# Patient Record
Sex: Female | Born: 1980
Health system: Southern US, Community
[De-identification: ages and names within clinical notes are randomized; demographics above are authoritative.]

## PROBLEM LIST (undated history)

## (undated) DIAGNOSIS — I10 Essential (primary) hypertension: Secondary | ICD-10-CM

## (undated) DIAGNOSIS — K219 Gastro-esophageal reflux disease without esophagitis: Secondary | ICD-10-CM

## (undated) DIAGNOSIS — F32A Depression, unspecified: Secondary | ICD-10-CM

## (undated) DIAGNOSIS — IMO0002 Reserved for concepts with insufficient information to code with codable children: Secondary | ICD-10-CM

## (undated) DIAGNOSIS — E538 Deficiency of other specified B group vitamins: Secondary | ICD-10-CM

## (undated) DIAGNOSIS — K52839 Microscopic colitis, unspecified: Secondary | ICD-10-CM

## (undated) DIAGNOSIS — I2510 Atherosclerotic heart disease of native coronary artery without angina pectoris: Secondary | ICD-10-CM

## (undated) DIAGNOSIS — K529 Noninfective gastroenteritis and colitis, unspecified: Secondary | ICD-10-CM

## (undated) DIAGNOSIS — K760 Fatty (change of) liver, not elsewhere classified: Secondary | ICD-10-CM

## (undated) DIAGNOSIS — R112 Nausea with vomiting, unspecified: Secondary | ICD-10-CM

## (undated) DIAGNOSIS — F4001 Agoraphobia with panic disorder: Secondary | ICD-10-CM

## (undated) DIAGNOSIS — N809 Endometriosis, unspecified: Secondary | ICD-10-CM

## (undated) DIAGNOSIS — M1990 Unspecified osteoarthritis, unspecified site: Secondary | ICD-10-CM

## (undated) DIAGNOSIS — K589 Irritable bowel syndrome without diarrhea: Secondary | ICD-10-CM

## (undated) DIAGNOSIS — T8859XA Other complications of anesthesia, initial encounter: Secondary | ICD-10-CM

## (undated) DIAGNOSIS — F319 Bipolar disorder, unspecified: Secondary | ICD-10-CM

## (undated) DIAGNOSIS — K635 Polyp of colon: Secondary | ICD-10-CM

## (undated) DIAGNOSIS — F329 Major depressive disorder, single episode, unspecified: Secondary | ICD-10-CM

## (undated) DIAGNOSIS — E785 Hyperlipidemia, unspecified: Secondary | ICD-10-CM

## (undated) DIAGNOSIS — F419 Anxiety disorder, unspecified: Secondary | ICD-10-CM

## (undated) DIAGNOSIS — D649 Anemia, unspecified: Secondary | ICD-10-CM

## (undated) DIAGNOSIS — Z789 Other specified health status: Secondary | ICD-10-CM

## (undated) DIAGNOSIS — Z9889 Other specified postprocedural states: Secondary | ICD-10-CM

## (undated) DIAGNOSIS — G43909 Migraine, unspecified, not intractable, without status migrainosus: Secondary | ICD-10-CM

## (undated) HISTORY — DX: Gastro-esophageal reflux disease without esophagitis: K21.9

## (undated) HISTORY — DX: Fatty (change of) liver, not elsewhere classified: K76.0

## (undated) HISTORY — PX: CHOLECYSTECTOMY: SHX55

## (undated) HISTORY — DX: Anxiety disorder, unspecified: F41.9

## (undated) HISTORY — DX: Anemia, unspecified: D64.9

## (undated) HISTORY — PX: KNEE SURGERY: SHX244

## (undated) HISTORY — DX: Polyp of colon: K63.5

## (undated) HISTORY — DX: Agoraphobia with panic disorder: F40.01

## (undated) HISTORY — DX: Essential (primary) hypertension: I10

## (undated) HISTORY — DX: Deficiency of other specified B group vitamins: E53.8

## (undated) HISTORY — DX: Endometriosis, unspecified: N80.9

## (undated) HISTORY — DX: Atherosclerotic heart disease of native coronary artery without angina pectoris: I25.10

## (undated) HISTORY — DX: Hyperlipidemia, unspecified: E78.5

## (undated) HISTORY — DX: Irritable bowel syndrome, unspecified: K58.9

## (undated) HISTORY — DX: Noninfective gastroenteritis and colitis, unspecified: K52.9

## (undated) HISTORY — PX: MANDIBLE SURGERY: SHX707

## (undated) HISTORY — DX: Major depressive disorder, single episode, unspecified: F32.9

## (undated) HISTORY — DX: Depression, unspecified: F32.A

## (undated) HISTORY — PX: APPENDECTOMY: SHX54

---

## 1999-04-04 ENCOUNTER — Other Ambulatory Visit: Admission: RE | Admit: 1999-04-04 | Discharge: 1999-04-04 | Payer: Self-pay | Admitting: Family Medicine

## 1999-08-07 ENCOUNTER — Other Ambulatory Visit: Admission: RE | Admit: 1999-08-07 | Discharge: 1999-08-07 | Payer: Self-pay | Admitting: Family Medicine

## 1999-09-27 ENCOUNTER — Other Ambulatory Visit: Admission: RE | Admit: 1999-09-27 | Discharge: 1999-09-27 | Payer: Self-pay | Admitting: Obstetrics and Gynecology

## 1999-11-28 ENCOUNTER — Other Ambulatory Visit: Admission: RE | Admit: 1999-11-28 | Discharge: 1999-11-28 | Payer: Self-pay | Admitting: *Deleted

## 1999-11-28 ENCOUNTER — Other Ambulatory Visit: Admission: RE | Admit: 1999-11-28 | Discharge: 1999-11-28 | Payer: Self-pay | Admitting: Obstetrics and Gynecology

## 1999-12-01 ENCOUNTER — Other Ambulatory Visit: Admission: RE | Admit: 1999-12-01 | Discharge: 1999-12-01 | Payer: Self-pay | Admitting: Obstetrics and Gynecology

## 1999-12-01 ENCOUNTER — Encounter (INDEPENDENT_AMBULATORY_CARE_PROVIDER_SITE_OTHER): Payer: Self-pay

## 2000-02-25 ENCOUNTER — Ambulatory Visit (HOSPITAL_COMMUNITY): Admission: RE | Admit: 2000-02-25 | Discharge: 2000-02-25 | Payer: Self-pay | Admitting: Neurosurgery

## 2000-02-25 ENCOUNTER — Encounter: Payer: Self-pay | Admitting: Neurosurgery

## 2000-04-03 ENCOUNTER — Other Ambulatory Visit: Admission: RE | Admit: 2000-04-03 | Discharge: 2000-04-03 | Payer: Self-pay | Admitting: *Deleted

## 2000-08-12 ENCOUNTER — Other Ambulatory Visit: Admission: RE | Admit: 2000-08-12 | Discharge: 2000-08-12 | Payer: Self-pay | Admitting: *Deleted

## 2000-12-17 ENCOUNTER — Other Ambulatory Visit: Admission: RE | Admit: 2000-12-17 | Discharge: 2000-12-17 | Payer: Self-pay | Admitting: *Deleted

## 2001-01-14 ENCOUNTER — Other Ambulatory Visit: Admission: RE | Admit: 2001-01-14 | Discharge: 2001-01-14 | Payer: Self-pay | Admitting: *Deleted

## 2001-01-14 ENCOUNTER — Encounter (INDEPENDENT_AMBULATORY_CARE_PROVIDER_SITE_OTHER): Payer: Self-pay

## 2001-05-20 ENCOUNTER — Other Ambulatory Visit: Admission: RE | Admit: 2001-05-20 | Discharge: 2001-05-20 | Payer: Self-pay | Admitting: *Deleted

## 2001-09-29 ENCOUNTER — Encounter: Payer: Self-pay | Admitting: Infectious Diseases

## 2001-09-29 ENCOUNTER — Ambulatory Visit (HOSPITAL_COMMUNITY): Admission: RE | Admit: 2001-09-29 | Discharge: 2001-09-29 | Payer: Self-pay | Admitting: Infectious Diseases

## 2001-10-05 ENCOUNTER — Other Ambulatory Visit: Admission: RE | Admit: 2001-10-05 | Discharge: 2001-10-05 | Payer: Self-pay | Admitting: *Deleted

## 2002-02-10 ENCOUNTER — Other Ambulatory Visit: Admission: RE | Admit: 2002-02-10 | Discharge: 2002-02-10 | Payer: Self-pay | Admitting: *Deleted

## 2002-06-07 ENCOUNTER — Other Ambulatory Visit: Admission: RE | Admit: 2002-06-07 | Discharge: 2002-06-07 | Payer: Self-pay | Admitting: *Deleted

## 2002-06-23 ENCOUNTER — Ambulatory Visit (HOSPITAL_COMMUNITY): Admission: RE | Admit: 2002-06-23 | Discharge: 2002-06-23 | Payer: Self-pay | Admitting: Infectious Diseases

## 2002-06-23 ENCOUNTER — Encounter: Payer: Self-pay | Admitting: Infectious Diseases

## 2003-02-08 ENCOUNTER — Encounter: Payer: Self-pay | Admitting: Obstetrics and Gynecology

## 2003-02-08 ENCOUNTER — Inpatient Hospital Stay (HOSPITAL_COMMUNITY): Admission: AD | Admit: 2003-02-08 | Discharge: 2003-02-08 | Payer: Self-pay | Admitting: Obstetrics and Gynecology

## 2003-02-16 ENCOUNTER — Other Ambulatory Visit: Admission: RE | Admit: 2003-02-16 | Discharge: 2003-02-16 | Payer: Self-pay | Admitting: *Deleted

## 2003-03-24 ENCOUNTER — Inpatient Hospital Stay (HOSPITAL_COMMUNITY): Admission: EM | Admit: 2003-03-24 | Discharge: 2003-03-28 | Payer: Self-pay | Admitting: *Deleted

## 2003-03-24 ENCOUNTER — Encounter: Payer: Self-pay | Admitting: *Deleted

## 2003-07-09 ENCOUNTER — Emergency Department (HOSPITAL_COMMUNITY): Admission: EM | Admit: 2003-07-09 | Discharge: 2003-07-09 | Payer: Self-pay | Admitting: Emergency Medicine

## 2003-07-11 ENCOUNTER — Ambulatory Visit (HOSPITAL_COMMUNITY): Admission: RE | Admit: 2003-07-11 | Discharge: 2003-07-11 | Payer: Self-pay | Admitting: Radiology

## 2003-07-26 ENCOUNTER — Ambulatory Visit (HOSPITAL_COMMUNITY): Admission: RE | Admit: 2003-07-26 | Discharge: 2003-07-26 | Payer: Self-pay | Admitting: Neurology

## 2005-01-31 ENCOUNTER — Ambulatory Visit: Payer: Self-pay | Admitting: Family Medicine

## 2005-04-14 ENCOUNTER — Ambulatory Visit: Payer: Self-pay | Admitting: Family Medicine

## 2005-07-30 ENCOUNTER — Ambulatory Visit: Payer: Self-pay | Admitting: Family Medicine

## 2005-10-15 ENCOUNTER — Ambulatory Visit: Payer: Self-pay | Admitting: Family Medicine

## 2009-01-10 ENCOUNTER — Encounter: Admission: RE | Admit: 2009-01-10 | Discharge: 2009-01-10 | Payer: Self-pay | Admitting: Orthopedic Surgery

## 2009-01-10 ENCOUNTER — Encounter: Admission: RE | Admit: 2009-01-10 | Discharge: 2009-01-10 | Payer: Self-pay

## 2009-05-15 ENCOUNTER — Encounter: Admission: RE | Admit: 2009-05-15 | Discharge: 2009-05-15 | Payer: Self-pay | Admitting: Orthopedic Surgery

## 2011-01-03 NOTE — Discharge Summary (Signed)
NAMEROBERT, Kayla Holden NO.:  [id number]   MEDICAL RECORD NO.:  [id number]                   PATIENT TYPE:  INP   LOCATION:  3003                                 FACILITY:  [facility]   PHYSICIAN:  Hettie Holstein, D.O.                 DATE OF BIRTH:  February 26, 1981   DATE OF ADMISSION:  03/23/2003  DATE OF DISCHARGE:  03/28/2003                                 DISCHARGE SUMMARY   ADMISSION DIAGNOSES:  1. Aseptic meningitis  2. Severe meningismus.  3. Cephalgia.   DISCHARGE DIAGNOSES:  1. Aseptic meningitis  2. Severe meningismus.  3. Cephalgia.   CONSULTATIONS:  Infectious disease.   PROCEDURE:  Lumbar puncture performed in the emergency department.   HISTORY OF PRESENT ILLNESS:  This is a 30 year old female with a medical  history provided by her mother secondary to the patient's discomfort and  difficulty in obtaining history from her.  Over the course of the week, the  patient's mother stated that she has had complaints of a headache and back  aches that have progressively worsened over the course of the week.  She  stated that she had complained of some numbness and tingling in her  extremities as well as back and upper thighs.  She had a fever yesterday and  presented to her PCP and was sent to the hospital for an LP.  The family  decided they would like her to be seen at [facility]. [facility],  therefore, she was evaluated in the emergency department.  She was found to  have stiff neck, nuchal rigidity and low grade temperature.  CT was  performed in the ER and revealed no acute bleed.  Lumbar puncture was  performed revealing findings consistent with nonbacterial  meningitis/suspected aseptic meningitis.   HOSPITAL COURSE:  Ms. Pickert was admitted to the monitored floor.  She was  placed on pain medications. She had trouble tolerating the Dilaudid as well  as morphine, therefore, Demerol was substituted.  She found very little  relief.  She continued to be very uncomfortable despite narcotics.  Her  symptoms persisted and due to family concerns, infectious disease was  involved.  At this time, serologies of her CSF were pending.  Latex  agglutination studies for common pathogens were negative.  Serologies for  arboviral panel was sent. HIV studies were sent as well and were negative.  The arboviral  panel as well, was negative.  She slowly improved with time  and she was on OxyContin 10 mg p.o. q.12h. and she did quite well.  She  remained afebrile with stable vital signs.  She was discharged home on  Avalox 400 mg p.o. daily and oxycodone 5 mg one to two p.o. q.6h. p.r.n. She  was discharged to follow up with her primary care physician in one to two  weeks.   LABORATORY DATA:  This hospitalization, CT  scan of her head revealed no  evidence of acute intracranial abnormality.   Procedure, as noted above, was lumbar puncture under fluoroscopy.  HIV  status was negative.  Her CSF revealed rbc count of 3.6, wbc 7.3,  neutrophils 3, lymphocytes 40 to 80%, monocytes 15 to 45%, eos 0 to 1%,  protein 90  mg/dL, glucose 53 mg/dL.  Blood cultures and CSF cultures were negative.  The Gram stain revealed wbc, this was on the CSF and no organisms or virus  was detected.  As before, latex agglutination studies were negative, no  bacterial pathogens.                                                Hettie Holstein, D.O.    ESS/MEDQ  D:  05/03/2003  T:  05/04/2003  Job:  [id number]

## 2011-01-03 NOTE — H&P (Signed)
NAME:  Kayla Holden, Kayla Holden NO.:  [id number]   MEDICAL RECORD NO.:  [id number]                   PATIENT TYPE:  INP   LOCATION:  3003                                 FACILITY:  [facility]   PHYSICIAN:  Hettie Holstein, D.O.                 DATE OF BIRTH:  [birth year]   DATE OF ADMISSION:  03/23/2003  DATE OF DISCHARGE:                                HISTORY & PHYSICAL   PRIMARY CARE PHYSICIAN:  Dina Rich, M.D., [place].   CHIEF COMPLAINT:  Headache and backache progressing over course of a week.   HISTORY OF PRESENT ILLNESS:  This is a 30 year old female with medical  history provided by her mother, who is present, secondary to the patient's  discomfort and difficulty in obtaining history from her.  Over the course of  a week, the patient's mother states she has had complaints of headaches and  backaches that have progressively worsened over the course of a week.  She  stated that she had complained of some numbness and tingling in her  extremities as well as the back of her thigh.  She noted a fever yesterday  and presented to her primary physician and was sent to the hospital for an  LP.  The family decided that they would like to be seen at [facility].  Therefore, she were evaluated here at [facility] Emergency [facility] with  the above complaints.  She was found to have a stiff neck, nuchal rigidity,  and a low-grade temperature.  CT was performed in the emergency department  revealing no acute bleed.  A lumbar puncture was performed revealing  findings consistent with nonbacterial meningitis and suspected aseptic  meningitis.   PAST MEDICAL HISTORY:  The patient's mother denies any history of  hypertension, coronary artery disease, diabetes mellitus.  States that she  has an old back injury from horse riding in the distant past.  She states  that she has undergone some therapy for what sounds like latent  tuberculosis.   MEDICATIONS:  She is only on  birth control pills at home and no other  medications.   ALLERGIES:  She denies any allergies.  She has intolerance to INH, where she  developed GI symptoms.   SOCIAL HISTORY:  She uses occasional tobacco and occasional alcohol.  She is  single.  She worked as a Designer, industrial/product at [employer], as well as here  at [employer].  She denies any needle sticks or recent sick contacts or  recent travel out of the country or out of the area.   FAMILY HISTORY:  Unremarkable.  Her mother is 67 and healthy.  Her  biological father is 65 and healthy as well.  She lives with her stepfather  and her mother at present.   REVIEW OF SYSTEMS:  GENERAL:  No weight loss in her activity over the past  couple of months.  All of her problems have started within the past week.  HEENT:  The patient reports only problems with headache over the past week  prior to this.  No history of pharyngitis.  CARDIOVASCULAR:  No chest pain  __________.  GI:  No nausea today or while she was in the emergency  department.  Otherwise, no vomiting, diarrhea, constipation, melena,  hematochezia, or abdominal discomfort.  GU:  The patient is denying any  complaints at this time.  A urine drug screen was performed in the emergency  department.  She is not pregnant.  Upon questioning regarding studies  performed on February 08, 2003, the patient provides no history.  She had a CT  scan which apparently was for some left-sided abdominal pain, having a  transvaginal ultrasound revealing an ovarian cyst.  Further history with  regard to this will be deferred until the patient is more apt to answer  these questions.  NEUROLOGIC:  As noted above in the history of presenting illness.  The  patient has had some tingling in her feet and hands, as well as the back of  her thighs with the history of presenting illness.   PHYSICAL EXAMINATION:  VITAL SIGNS:  The patient's T-max in the emergency  department was 100.3 and her blood pressure  was 133/74, respirations 16,  heart rate is at 87.  O2 saturation is 98% on room air.  GENERAL:  She is very somnolent in bed.  Answers a few questions.  Her  respirations are nonlabored and appears to be nontoxic.  HEENT:  She does have nuchal rigidity.  Neck is very tender to touch.  She  exhibits no photophobia.  Extraocular muscles:  The patient is very  uncomfortable and not completely cooperative with the examination.  Throat  is clear.  There is no evidence of exudate.  Neck, as noted above, is very  tender.  There is no thyromegaly, no tracheal deviation.  CARDIOVASCULAR:  S1, S2 without S3, S4 or murmur, rubs, or gallop.  PULMONARY:  There was no wheeze, crackles, rales to auscultation.  Lungs  were clear.  ABDOMEN:  Soft, nontender.  No palpable mass, no palpable tenderness.  Bowel  sounds were normoactive.  EXTREMITIES:  Nonedematous.  There was no cyanosis or clubbing.  Peripheral  pulses were palpable bilaterally and symmetrical.  Strength was +4/5 with  respect to upper extremities; however, this was limited secondary to patient  cooperation.  NEUROLOGIC:  The patient was alert and oriented to time and place, as well  as person; was able to answer questions appropriately when prompted  consistently.   LABORATORY DATA:  CBC:  WBC 4.6, hemoglobin 11.6, hematocrit 33.2, platelets  159.  Sodium 138, potassium 3.8, chloride 110, CO2 24, BUN 9, creatinine  0.6, glucose 100, calcium 8.2.  CSF studies revealed RBC equals 36, WBC  equals 7.3, neutrophils 3%, lymphocytes 35%, monocytes 61%, eosinophils 1%.  Total protein was 98 with normal range 15-45.  Glucose was 53 mg/dL.  It was  colorless.  Urine pregnancy was negative.  UA was negative as well.  Latex  and agglutination studies were pending as well as arboviral and enteroviral  serologies.   IMPRESSION AND PLAN:  Aseptic meningitis.  We will currently admit Ms. Crissman for symptomatic management and await further studies on  her  cerebrospinal fluid.  We will administer narcotic analgesic as needed and  obtain blood cultures as she is currently febrile.  We will administer  intravenous fluids and  keep her under close observation.                                                Hettie Holstein, D.O.    ESS/MEDQ  D:  03/24/2003  T:  03/24/2003  Job:  [id number]

## 2011-01-14 ENCOUNTER — Other Ambulatory Visit: Payer: Self-pay | Admitting: Orthopedic Surgery

## 2011-01-14 DIAGNOSIS — M25562 Pain in left knee: Secondary | ICD-10-CM

## 2011-01-16 ENCOUNTER — Other Ambulatory Visit: Payer: Self-pay

## 2011-01-16 ENCOUNTER — Ambulatory Visit
Admission: RE | Admit: 2011-01-16 | Discharge: 2011-01-16 | Disposition: A | Discharge status: Home / Self Care | Payer: BC Managed Care – PPO | Source: Ambulatory Visit | Attending: Orthopedic Surgery | Admitting: Orthopedic Surgery

## 2011-01-16 DIAGNOSIS — M25562 Pain in left knee: Secondary | ICD-10-CM

## 2012-12-22 ENCOUNTER — Ambulatory Visit: Payer: Self-pay

## 2013-05-05 ENCOUNTER — Other Ambulatory Visit: Payer: Self-pay | Admitting: Neurology

## 2013-05-05 DIAGNOSIS — R519 Headache, unspecified: Secondary | ICD-10-CM

## 2013-05-10 ENCOUNTER — Ambulatory Visit
Admission: RE | Admit: 2013-05-10 | Discharge: 2013-05-10 | Disposition: A | Discharge status: Home / Self Care | Payer: BC Managed Care – PPO | Source: Ambulatory Visit | Attending: Neurology | Admitting: Neurology

## 2013-05-10 DIAGNOSIS — R519 Headache, unspecified: Secondary | ICD-10-CM

## 2013-05-10 MED ORDER — IOHEXOL 180 MG/ML  SOLN
1.0000 mL | Freq: Once | INTRAMUSCULAR | Status: AC | PRN
  Administered 2013-05-10: 1 mL via EPIDURAL

## 2013-05-10 NOTE — Progress Notes (Addendum)
Discharge instructions explained to pt and her father.

## 2013-05-10 NOTE — Progress Notes (Signed)
Blood drawn from left cephalic vein for blood patch. Site is unremarkable and pt tolerated procedure well.

## 2014-07-18 ENCOUNTER — Emergency Department: Payer: Self-pay | Admitting: Emergency Medicine

## 2014-07-18 LAB — CBC
HCT: 36.8 % (ref 35.0–47.0)
HGB: 12.2 g/dL (ref 12.0–16.0)
MCH: 30.8 pg (ref 26.0–34.0)
MCHC: 33.2 g/dL (ref 32.0–36.0)
MCV: 93 fL (ref 80–100)
Platelet: 262 10*3/uL (ref 150–440)
RBC: 3.96 10*6/uL (ref 3.80–5.20)
RDW: 13.1 % (ref 11.5–14.5)
WBC: 7.4 10*3/uL (ref 3.6–11.0)

## 2014-07-18 LAB — BASIC METABOLIC PANEL
Anion Gap: 9 (ref 7–16)
BUN: 11 mg/dL (ref 7–18)
Calcium, Total: 8.4 mg/dL — ABNORMAL LOW (ref 8.5–10.1)
Chloride: 106 mmol/L (ref 98–107)
Co2: 24 mmol/L (ref 21–32)
Creatinine: 0.79 mg/dL (ref 0.60–1.30)
EGFR (African American): >60
EGFR (Non-African Amer.): >60
Glucose: 100 mg/dL — ABNORMAL HIGH (ref 65–99)
Osmolality: 277 (ref 275–301)
Potassium: 4.1 mmol/L (ref 3.5–5.1)
Sodium: 139 mmol/L (ref 136–145)

## 2014-07-18 LAB — TROPONIN I
Troponin-I: <0.02 ng/mL
Troponin-I: <0.02 ng/mL

## 2015-05-08 ENCOUNTER — Other Ambulatory Visit (HOSPITAL_COMMUNITY): Payer: Self-pay | Admitting: Neurosurgery

## 2015-05-08 DIAGNOSIS — G935 Compression of brain: Secondary | ICD-10-CM

## 2015-05-09 DIAGNOSIS — R519 Headache, unspecified: Secondary | ICD-10-CM | POA: Insufficient documentation

## 2015-05-09 DIAGNOSIS — R51 Headache: Secondary | ICD-10-CM

## 2015-05-11 ENCOUNTER — Other Ambulatory Visit (HOSPITAL_COMMUNITY): Payer: Self-pay | Admitting: Neurosurgery

## 2015-05-11 ENCOUNTER — Ambulatory Visit (HOSPITAL_COMMUNITY)
Admission: RE | Admit: 2015-05-11 | Discharge: 2015-05-11 | Disposition: A | Discharge status: Home / Self Care | Payer: BLUE CROSS/BLUE SHIELD | Source: Ambulatory Visit | Attending: Neurosurgery | Admitting: Neurosurgery

## 2015-05-11 DIAGNOSIS — G935 Compression of brain: Secondary | ICD-10-CM

## 2015-05-15 ENCOUNTER — Encounter (HOSPITAL_COMMUNITY): Payer: Self-pay | Admitting: Emergency Medicine

## 2015-05-15 ENCOUNTER — Emergency Department (HOSPITAL_COMMUNITY)
Admission: EM | Admit: 2015-05-15 | Discharge: 2015-05-15 | Disposition: A | Discharge status: Home / Self Care | Payer: BLUE CROSS/BLUE SHIELD | Attending: Emergency Medicine | Admitting: Emergency Medicine

## 2015-05-15 ENCOUNTER — Emergency Department (HOSPITAL_COMMUNITY): Payer: BLUE CROSS/BLUE SHIELD

## 2015-05-15 DIAGNOSIS — Z8679 Personal history of other diseases of the circulatory system: Secondary | ICD-10-CM | POA: Diagnosis not present

## 2015-05-15 DIAGNOSIS — Z8719 Personal history of other diseases of the digestive system: Secondary | ICD-10-CM | POA: Diagnosis not present

## 2015-05-15 DIAGNOSIS — H469 Unspecified optic neuritis: Secondary | ICD-10-CM

## 2015-05-15 DIAGNOSIS — R51 Headache: Secondary | ICD-10-CM | POA: Diagnosis present

## 2015-05-15 DIAGNOSIS — H47399 Other disorders of optic disc, unspecified eye: Secondary | ICD-10-CM | POA: Insufficient documentation

## 2015-05-15 DIAGNOSIS — H5461 Unqualified visual loss, right eye, normal vision left eye: Secondary | ICD-10-CM | POA: Insufficient documentation

## 2015-05-15 HISTORY — DX: Reserved for concepts with insufficient information to code with codable children: IMO0002

## 2015-05-15 HISTORY — DX: Migraine, unspecified, not intractable, without status migrainosus: G43.909

## 2015-05-15 HISTORY — DX: Microscopic colitis, unspecified: K52.839

## 2015-05-15 MED ORDER — TETRACAINE HCL 0.5 % OP SOLN
2.0000 [drp] | Freq: Once | OPHTHALMIC | Status: AC
  Administered 2015-05-15: 2 [drp] via OPHTHALMIC
  Filled 2015-05-15: qty 2

## 2015-05-15 MED ORDER — LORAZEPAM 2 MG/ML IJ SOLN
1.0000 mg | Freq: Once | INTRAMUSCULAR | Status: AC
  Administered 2015-05-15: 1 mg via INTRAVENOUS
  Filled 2015-05-15: qty 1

## 2015-05-15 MED ORDER — HYDROMORPHONE HCL 1 MG/ML IJ SOLN
1.0000 mg | Freq: Once | INTRAMUSCULAR | Status: AC
  Administered 2015-05-15: 1 mg via INTRAVENOUS
  Filled 2015-05-15: qty 1

## 2015-05-15 MED ORDER — ONDANSETRON HCL 4 MG/2ML IJ SOLN
4.0000 mg | Freq: Once | INTRAMUSCULAR | Status: AC
  Administered 2015-05-15: 4 mg via INTRAVENOUS
  Filled 2015-05-15: qty 2

## 2015-05-15 NOTE — ED Notes (Signed)
MD at bedside. 

## 2015-05-15 NOTE — Discharge Instructions (Signed)
Visual Disturbances °You have had a disturbance in your vision. This may be caused by various conditions, such as: °· Migraines. Migraine headaches are often preceded by a disturbance in vision. Blind spots or light flashes are followed by a headache. This type of visual disturbance is temporary. It does not damage the eye. °· Glaucoma. This is caused by increased pressure in the eye. Symptoms include haziness, blurred vision, or seeing rainbow colored circles when looking at bright lights. Partial or complete visual loss can occur. You may or may not experience eye pain. Visual loss may be gradual or sudden and is irreversible. Glaucoma is the leading cause of blindness. °· Retina problems. Vision will be reduced if the retina becomes detached or if there is a circulation problem as with diabetes, high blood pressure, or a mini-stroke. Symptoms include seeing "floaters," flashes of light, or shadows, as if a curtain has fallen over your eye. °· Optic nerve problems. The main nerve in your eye can be damaged by redness, soreness, and swelling (inflammation), poor circulation, drugs, and toxins. °It is very important to have a complete exam done by a specialist to determine the exact cause of your eye problem. The specialist may recommend medicines or surgery, depending on the cause of the problem. This can help prevent further loss of vision or reduce the risk of having a stroke. Contact the caregiver to whom you have been referred and arrange for follow-up care right away. °SEEK IMMEDIATE MEDICAL CARE IF:  °· Your vision gets worse. °· You develop severe headaches. °· You have any weakness or numbness in the face, arms, or legs. °· You have any trouble speaking or walking. °Document Released: 09/11/2004 Document Revised: 10/27/2011 Document Reviewed: 01/02/2010 °ExitCare® Patient Information ©2015 [employer]. This information is not intended to replace advice given to you by your health care provider. Make sure  you discuss any questions you have with your health care provider. ° °

## 2015-05-15 NOTE — ED Notes (Signed)
Patient transported to MRI 

## 2015-05-15 NOTE — ED Notes (Signed)
Pt sts hx of HA x years and had this HA since April with loss of vision in left eye 10 days ago; pt sent by neurologist

## 2015-05-15 NOTE — ED Notes (Signed)
**Note De-identified  Obfuscation** MD at bedside. 

## 2015-05-15 NOTE — ED Provider Notes (Signed)
CSN: [id number]     Arrival date & time 05/15/15  4098 History   First MD Initiated Contact with Patient 05/15/15 [phone]     Chief Complaint  Patient presents with  . Headache  . Loss of Vision     (Consider location/radiation/quality/duration/timing/severity/associated sxs/prior Treatment) Patient is a 34 y.o. female presenting with headaches. The history is provided by the patient.  Headache Pain location:  Occipital Quality:  Dull Radiates to:  Does not radiate Onset quality:  Gradual Timing:  Constant Progression:  Unchanged Chronicity:  Chronic Similar to prior headaches: yes   Context: not activity and not exposure to bright light   Relieved by:  Nothing Worsened by:  Nothing Associated symptoms: no abdominal pain, no cough, no fever and no vomiting     Past Medical History  Diagnosis Date  . Migraine headache   . Microscopic colitis   . Chiari malformation    History reviewed. No pertinent past surgical history. History reviewed. No pertinent family history. Social History  Substance Use Topics  . Smoking status: Never Smoker   . Smokeless tobacco: None  . Alcohol Use: No   OB History    No data available     Review of Systems  Constitutional: Negative for fever.  Respiratory: Negative for cough and shortness of breath.   Gastrointestinal: Negative for vomiting and abdominal pain.  Neurological: Positive for headaches.  All other systems reviewed and are negative.     Allergies  Isoniazid; Rifampin; Butalbital-aspirin-caffeine; Ketoprofen; and Clindamycin/lincomycin  Home Medications   Prior to Admission medications   Not on File   BP 117/64 mmHg  Pulse 75  Temp(Src) 98.6 F (37 C) (Oral)  Resp 16  Ht  (1.6 m)  Wt 159 lb (72.122 kg)  BMI 28.17 kg/m2  SpO2 97%  LMP 03/23/2015 Physical Exam  Constitutional: She is oriented to person, place, and time. She appears well-developed and well-nourished. No distress.  HENT:  Head:  Normocephalic and atraumatic.  Mouth/Throat: Oropharynx is clear and moist.  Eyes: EOM are normal. Pupils are equal, round, and reactive to light.  Right intraocular pressure is 11. Right disc is sharp without "blood and thunder" retina or pale macula. She cannot sense light in her R eye. No gross APD.  Neck: Normal range of motion. Neck supple.  Cardiovascular: Normal rate and regular rhythm.  Exam reveals no friction rub.   No murmur heard. Pulmonary/Chest: Effort normal and breath sounds normal. No respiratory distress. She has no wheezes. She has no rales.  Abdominal: Soft. She exhibits no distension. There is no tenderness. There is no rebound.  Musculoskeletal: Normal range of motion. She exhibits no edema.  Neurological: She is alert and oriented to person, place, and time. A cranial nerve deficit is present. GCS eye subscore is 4. GCS verbal subscore is 5. GCS motor subscore is 6.  Cannot sense light in the R eye, no grossly obvious APD.  Skin: She is not diaphoretic.  Nursing note and vitals reviewed.   ED Course  Procedures (including critical care time) Labs Review Labs Reviewed - No data to display  Imaging Review Mr Shirlee Latch Wo Contrast  05/15/2015   CLINICAL DATA:  Acute right eye vision loss beginning 4 days ago.  EXAM: MRA HEAD WITHOUT CONTRAST  TECHNIQUE: Angiographic images of the Circle of Willis were obtained using MRA technique without intravenous contrast.  COMPARISON:  07/26/2003  FINDINGS: The visualized distal vertebral arteries are patent and codominant without stenosis. PICA,  AICA, and SCA origins are patent. Basilar artery is patent without stenosis. Posterior communicating arteries are not identified. PCAs are unremarkable.  Internal carotid arteries are patent from skullbase to carotid termini without stenosis. ACAs and MCAs are unremarkable. No intracranial aneurysm is identified.  IMPRESSION: Unremarkable head MRA.   Electronically Signed   By: Sebastian Ache  M.D.   On: 05/15/2015 11:57   I have personally reviewed and evaluated these images and lab results as part of my medical decision-making.   EKG Interpretation None      MDM   Final diagnoses:  Vision loss, right eye  Optic neuropathy    34 year old female with history of chronic migraines sent from her neurologist office for concern of vision loss. Patient has history of chronic headaches and Chiari malformation. She was seen by her neurologist today after getting 40 trigger point injections. She has been having vision loss from the past 4-5 days, progressively worsening without pain. No fever. Neurologist was concerned to send her to the ED. Here she is doing well, neurologically intact except for she cannot sense light in the right eye. I did an MRA of her head was negative. She had a recent MRI 4 days ago that was normal without signs of stroke. This was in the midst of her vision loss. She has no temporal tenderness and is young so I highly doubt this is related to giant cell arteritis. I spoke with ophthalmology, Dr. Clarisa Kindred,  who states with her being unable to sense light in her right eye is likely an optic neuropathy. He feels this is best handled by a neurologist. I attempted to call the patient neurologist in his office was closed for an hour half for lunch. Patient was a just ready to go home so I discharged her. I spoke with her neurologist later in the day he reported he wanted a full ophthalmologic exam done with a CT cannot determine if this is some elevation disorder versus true ocular pathology. I spoke with Dr. Clarisa Kindred who can see her tomorrow. I spoke with the patient directly found an ophthalmologist in her hometown of [place] who can see her tomorrow morning.   Elwin Mocha, MD 05/15/15 [phone]

## 2015-06-20 DIAGNOSIS — F449 Dissociative and conversion disorder, unspecified: Secondary | ICD-10-CM | POA: Insufficient documentation

## 2015-11-22 DIAGNOSIS — F331 Major depressive disorder, recurrent, moderate: Secondary | ICD-10-CM | POA: Diagnosis not present

## 2015-11-29 DIAGNOSIS — F331 Major depressive disorder, recurrent, moderate: Secondary | ICD-10-CM | POA: Diagnosis not present

## 2015-12-05 DIAGNOSIS — F418 Other specified anxiety disorders: Secondary | ICD-10-CM | POA: Diagnosis not present

## 2015-12-05 DIAGNOSIS — G4709 Other insomnia: Secondary | ICD-10-CM | POA: Diagnosis not present

## 2015-12-06 DIAGNOSIS — F419 Anxiety disorder, unspecified: Secondary | ICD-10-CM | POA: Diagnosis not present

## 2015-12-10 DIAGNOSIS — F410 Panic disorder [episodic paroxysmal anxiety] without agoraphobia: Secondary | ICD-10-CM | POA: Diagnosis not present

## 2015-12-10 DIAGNOSIS — F332 Major depressive disorder, recurrent severe without psychotic features: Secondary | ICD-10-CM | POA: Diagnosis not present

## 2015-12-10 DIAGNOSIS — F411 Generalized anxiety disorder: Secondary | ICD-10-CM | POA: Diagnosis not present

## 2015-12-13 DIAGNOSIS — F419 Anxiety disorder, unspecified: Secondary | ICD-10-CM | POA: Diagnosis not present

## 2015-12-21 DIAGNOSIS — R112 Nausea with vomiting, unspecified: Secondary | ICD-10-CM | POA: Diagnosis not present

## 2015-12-21 DIAGNOSIS — R197 Diarrhea, unspecified: Secondary | ICD-10-CM | POA: Diagnosis not present

## 2015-12-21 DIAGNOSIS — R1012 Left upper quadrant pain: Secondary | ICD-10-CM | POA: Diagnosis not present

## 2015-12-24 DIAGNOSIS — F419 Anxiety disorder, unspecified: Secondary | ICD-10-CM | POA: Diagnosis not present

## 2015-12-25 DIAGNOSIS — R1013 Epigastric pain: Secondary | ICD-10-CM | POA: Diagnosis not present

## 2015-12-25 DIAGNOSIS — R197 Diarrhea, unspecified: Secondary | ICD-10-CM | POA: Diagnosis not present

## 2016-01-02 DIAGNOSIS — M150 Primary generalized (osteo)arthritis: Secondary | ICD-10-CM | POA: Diagnosis not present

## 2016-01-02 DIAGNOSIS — D518 Other vitamin B12 deficiency anemias: Secondary | ICD-10-CM | POA: Diagnosis not present

## 2016-01-02 DIAGNOSIS — G43709 Chronic migraine without aura, not intractable, without status migrainosus: Secondary | ICD-10-CM | POA: Diagnosis not present

## 2016-01-02 DIAGNOSIS — F418 Other specified anxiety disorders: Secondary | ICD-10-CM | POA: Diagnosis not present

## 2016-01-03 DIAGNOSIS — F332 Major depressive disorder, recurrent severe without psychotic features: Secondary | ICD-10-CM | POA: Diagnosis not present

## 2016-01-03 DIAGNOSIS — F410 Panic disorder [episodic paroxysmal anxiety] without agoraphobia: Secondary | ICD-10-CM | POA: Diagnosis not present

## 2016-01-03 DIAGNOSIS — F411 Generalized anxiety disorder: Secondary | ICD-10-CM | POA: Diagnosis not present

## 2016-01-07 DIAGNOSIS — F419 Anxiety disorder, unspecified: Secondary | ICD-10-CM | POA: Diagnosis not present

## 2016-01-17 DIAGNOSIS — F419 Anxiety disorder, unspecified: Secondary | ICD-10-CM | POA: Diagnosis not present

## 2016-01-17 DIAGNOSIS — Z79899 Other long term (current) drug therapy: Secondary | ICD-10-CM | POA: Diagnosis not present

## 2016-01-17 DIAGNOSIS — R1013 Epigastric pain: Secondary | ICD-10-CM | POA: Diagnosis not present

## 2016-01-17 DIAGNOSIS — R197 Diarrhea, unspecified: Secondary | ICD-10-CM | POA: Diagnosis not present

## 2016-01-17 DIAGNOSIS — K219 Gastro-esophageal reflux disease without esophagitis: Secondary | ICD-10-CM | POA: Diagnosis not present

## 2016-01-17 DIAGNOSIS — F329 Major depressive disorder, single episode, unspecified: Secondary | ICD-10-CM | POA: Diagnosis not present

## 2016-01-17 DIAGNOSIS — Z9049 Acquired absence of other specified parts of digestive tract: Secondary | ICD-10-CM | POA: Diagnosis not present

## 2016-01-17 DIAGNOSIS — K2970 Gastritis, unspecified, without bleeding: Secondary | ICD-10-CM | POA: Diagnosis not present

## 2016-01-17 DIAGNOSIS — I10 Essential (primary) hypertension: Secondary | ICD-10-CM | POA: Diagnosis not present

## 2016-01-17 DIAGNOSIS — K2900 Acute gastritis without bleeding: Secondary | ICD-10-CM | POA: Diagnosis not present

## 2016-01-17 DIAGNOSIS — K2950 Unspecified chronic gastritis without bleeding: Secondary | ICD-10-CM | POA: Diagnosis not present

## 2016-01-17 DIAGNOSIS — Z87891 Personal history of nicotine dependence: Secondary | ICD-10-CM | POA: Diagnosis not present

## 2016-01-17 DIAGNOSIS — K449 Diaphragmatic hernia without obstruction or gangrene: Secondary | ICD-10-CM | POA: Diagnosis not present

## 2016-01-29 DIAGNOSIS — F419 Anxiety disorder, unspecified: Secondary | ICD-10-CM | POA: Diagnosis not present

## 2016-01-30 DIAGNOSIS — D518 Other vitamin B12 deficiency anemias: Secondary | ICD-10-CM | POA: Diagnosis not present

## 2016-01-30 DIAGNOSIS — G43719 Chronic migraine without aura, intractable, without status migrainosus: Secondary | ICD-10-CM | POA: Diagnosis not present

## 2016-01-30 DIAGNOSIS — G4709 Other insomnia: Secondary | ICD-10-CM | POA: Diagnosis not present

## 2016-01-30 DIAGNOSIS — G43709 Chronic migraine without aura, not intractable, without status migrainosus: Secondary | ICD-10-CM | POA: Diagnosis not present

## 2016-01-30 DIAGNOSIS — G4452 New daily persistent headache (NDPH): Secondary | ICD-10-CM | POA: Diagnosis not present

## 2016-01-30 DIAGNOSIS — M150 Primary generalized (osteo)arthritis: Secondary | ICD-10-CM | POA: Diagnosis not present

## 2016-02-04 DIAGNOSIS — M791 Myalgia: Secondary | ICD-10-CM | POA: Diagnosis not present

## 2016-02-04 DIAGNOSIS — G43719 Chronic migraine without aura, intractable, without status migrainosus: Secondary | ICD-10-CM | POA: Diagnosis not present

## 2016-02-04 DIAGNOSIS — G4452 New daily persistent headache (NDPH): Secondary | ICD-10-CM | POA: Diagnosis not present

## 2016-02-04 DIAGNOSIS — M542 Cervicalgia: Secondary | ICD-10-CM | POA: Diagnosis not present

## 2016-02-04 DIAGNOSIS — G518 Other disorders of facial nerve: Secondary | ICD-10-CM | POA: Diagnosis not present

## 2016-02-15 DIAGNOSIS — F410 Panic disorder [episodic paroxysmal anxiety] without agoraphobia: Secondary | ICD-10-CM | POA: Diagnosis not present

## 2016-02-15 DIAGNOSIS — F332 Major depressive disorder, recurrent severe without psychotic features: Secondary | ICD-10-CM | POA: Diagnosis not present

## 2016-02-15 DIAGNOSIS — F411 Generalized anxiety disorder: Secondary | ICD-10-CM | POA: Diagnosis not present

## 2016-02-21 DIAGNOSIS — R197 Diarrhea, unspecified: Secondary | ICD-10-CM | POA: Diagnosis not present

## 2016-02-21 DIAGNOSIS — R1012 Left upper quadrant pain: Secondary | ICD-10-CM | POA: Diagnosis not present

## 2016-03-03 DIAGNOSIS — R110 Nausea: Secondary | ICD-10-CM | POA: Diagnosis not present

## 2016-03-03 DIAGNOSIS — M150 Primary generalized (osteo)arthritis: Secondary | ICD-10-CM | POA: Diagnosis not present

## 2016-03-03 DIAGNOSIS — F419 Anxiety disorder, unspecified: Secondary | ICD-10-CM | POA: Diagnosis not present

## 2016-03-03 DIAGNOSIS — G43709 Chronic migraine without aura, not intractable, without status migrainosus: Secondary | ICD-10-CM | POA: Diagnosis not present

## 2016-03-03 DIAGNOSIS — G360 Neuromyelitis optica [Devic]: Secondary | ICD-10-CM | POA: Diagnosis not present

## 2016-03-03 DIAGNOSIS — M545 Low back pain: Secondary | ICD-10-CM | POA: Diagnosis not present

## 2016-03-10 DIAGNOSIS — F419 Anxiety disorder, unspecified: Secondary | ICD-10-CM | POA: Diagnosis not present

## 2016-03-11 DIAGNOSIS — K52832 Lymphocytic colitis: Secondary | ICD-10-CM | POA: Diagnosis not present

## 2016-03-11 DIAGNOSIS — R110 Nausea: Secondary | ICD-10-CM | POA: Diagnosis not present

## 2016-03-11 DIAGNOSIS — R1013 Epigastric pain: Secondary | ICD-10-CM | POA: Diagnosis not present

## 2016-03-11 DIAGNOSIS — K529 Noninfective gastroenteritis and colitis, unspecified: Secondary | ICD-10-CM | POA: Diagnosis not present

## 2016-03-12 DIAGNOSIS — F410 Panic disorder [episodic paroxysmal anxiety] without agoraphobia: Secondary | ICD-10-CM | POA: Diagnosis not present

## 2016-03-12 DIAGNOSIS — F332 Major depressive disorder, recurrent severe without psychotic features: Secondary | ICD-10-CM | POA: Diagnosis not present

## 2016-03-12 DIAGNOSIS — F411 Generalized anxiety disorder: Secondary | ICD-10-CM | POA: Diagnosis not present

## 2016-03-17 DIAGNOSIS — F419 Anxiety disorder, unspecified: Secondary | ICD-10-CM | POA: Diagnosis not present

## 2016-03-31 DIAGNOSIS — F331 Major depressive disorder, recurrent, moderate: Secondary | ICD-10-CM | POA: Diagnosis not present

## 2016-04-02 DIAGNOSIS — D123 Benign neoplasm of transverse colon: Secondary | ICD-10-CM | POA: Diagnosis not present

## 2016-04-02 DIAGNOSIS — R197 Diarrhea, unspecified: Secondary | ICD-10-CM | POA: Diagnosis not present

## 2016-04-02 DIAGNOSIS — D125 Benign neoplasm of sigmoid colon: Secondary | ICD-10-CM | POA: Diagnosis not present

## 2016-04-02 DIAGNOSIS — K52832 Lymphocytic colitis: Secondary | ICD-10-CM | POA: Diagnosis not present

## 2016-04-02 DIAGNOSIS — D126 Benign neoplasm of colon, unspecified: Secondary | ICD-10-CM | POA: Diagnosis not present

## 2016-04-07 DIAGNOSIS — M150 Primary generalized (osteo)arthritis: Secondary | ICD-10-CM | POA: Diagnosis not present

## 2016-04-07 DIAGNOSIS — F411 Generalized anxiety disorder: Secondary | ICD-10-CM | POA: Diagnosis not present

## 2016-04-07 DIAGNOSIS — F5105 Insomnia due to other mental disorder: Secondary | ICD-10-CM | POA: Diagnosis not present

## 2016-04-14 DIAGNOSIS — F331 Major depressive disorder, recurrent, moderate: Secondary | ICD-10-CM | POA: Diagnosis not present

## 2016-04-15 DIAGNOSIS — F410 Panic disorder [episodic paroxysmal anxiety] without agoraphobia: Secondary | ICD-10-CM | POA: Diagnosis not present

## 2016-04-15 DIAGNOSIS — F411 Generalized anxiety disorder: Secondary | ICD-10-CM | POA: Diagnosis not present

## 2016-04-15 DIAGNOSIS — F332 Major depressive disorder, recurrent severe without psychotic features: Secondary | ICD-10-CM | POA: Diagnosis not present

## 2016-04-22 DIAGNOSIS — M25569 Pain in unspecified knee: Secondary | ICD-10-CM | POA: Diagnosis not present

## 2016-04-22 DIAGNOSIS — W19XXXA Unspecified fall, initial encounter: Secondary | ICD-10-CM | POA: Diagnosis not present

## 2016-04-22 DIAGNOSIS — M25562 Pain in left knee: Secondary | ICD-10-CM | POA: Diagnosis not present

## 2016-04-22 DIAGNOSIS — M25561 Pain in right knee: Secondary | ICD-10-CM | POA: Diagnosis not present

## 2016-04-22 DIAGNOSIS — S8001XA Contusion of right knee, initial encounter: Secondary | ICD-10-CM | POA: Diagnosis not present

## 2016-04-22 DIAGNOSIS — S81002A Unspecified open wound, left knee, initial encounter: Secondary | ICD-10-CM | POA: Diagnosis not present

## 2016-04-22 DIAGNOSIS — Z23 Encounter for immunization: Secondary | ICD-10-CM | POA: Diagnosis not present

## 2016-04-22 DIAGNOSIS — S81012A Laceration without foreign body, left knee, initial encounter: Secondary | ICD-10-CM | POA: Diagnosis not present

## 2016-04-24 DIAGNOSIS — F331 Major depressive disorder, recurrent, moderate: Secondary | ICD-10-CM | POA: Diagnosis not present

## 2016-04-29 DIAGNOSIS — M25569 Pain in unspecified knee: Secondary | ICD-10-CM | POA: Diagnosis not present

## 2016-04-29 DIAGNOSIS — T8133XA Disruption of traumatic injury wound repair, initial encounter: Secondary | ICD-10-CM | POA: Diagnosis not present

## 2016-05-06 DIAGNOSIS — G43719 Chronic migraine without aura, intractable, without status migrainosus: Secondary | ICD-10-CM | POA: Diagnosis not present

## 2016-05-06 DIAGNOSIS — G4452 New daily persistent headache (NDPH): Secondary | ICD-10-CM | POA: Diagnosis not present

## 2016-05-07 DIAGNOSIS — M150 Primary generalized (osteo)arthritis: Secondary | ICD-10-CM | POA: Diagnosis not present

## 2016-05-08 DIAGNOSIS — F331 Major depressive disorder, recurrent, moderate: Secondary | ICD-10-CM | POA: Diagnosis not present

## 2016-05-12 DIAGNOSIS — K219 Gastro-esophageal reflux disease without esophagitis: Secondary | ICD-10-CM | POA: Diagnosis not present

## 2016-05-12 DIAGNOSIS — M545 Low back pain: Secondary | ICD-10-CM | POA: Diagnosis not present

## 2016-05-12 DIAGNOSIS — G43709 Chronic migraine without aura, not intractable, without status migrainosus: Secondary | ICD-10-CM | POA: Diagnosis not present

## 2016-05-12 DIAGNOSIS — M150 Primary generalized (osteo)arthritis: Secondary | ICD-10-CM | POA: Diagnosis not present

## 2016-05-14 DIAGNOSIS — F410 Panic disorder [episodic paroxysmal anxiety] without agoraphobia: Secondary | ICD-10-CM | POA: Diagnosis not present

## 2016-05-14 DIAGNOSIS — F332 Major depressive disorder, recurrent severe without psychotic features: Secondary | ICD-10-CM | POA: Diagnosis not present

## 2016-05-14 DIAGNOSIS — F411 Generalized anxiety disorder: Secondary | ICD-10-CM | POA: Diagnosis not present

## 2016-05-15 DIAGNOSIS — R197 Diarrhea, unspecified: Secondary | ICD-10-CM | POA: Diagnosis not present

## 2016-05-15 DIAGNOSIS — R1012 Left upper quadrant pain: Secondary | ICD-10-CM | POA: Diagnosis not present

## 2016-05-16 DIAGNOSIS — F331 Major depressive disorder, recurrent, moderate: Secondary | ICD-10-CM | POA: Diagnosis not present

## 2016-06-12 DIAGNOSIS — F411 Generalized anxiety disorder: Secondary | ICD-10-CM | POA: Diagnosis not present

## 2016-06-12 DIAGNOSIS — F332 Major depressive disorder, recurrent severe without psychotic features: Secondary | ICD-10-CM | POA: Diagnosis not present

## 2016-06-12 DIAGNOSIS — F410 Panic disorder [episodic paroxysmal anxiety] without agoraphobia: Secondary | ICD-10-CM | POA: Diagnosis not present

## 2016-06-17 DIAGNOSIS — F331 Major depressive disorder, recurrent, moderate: Secondary | ICD-10-CM | POA: Diagnosis not present

## 2016-06-20 DIAGNOSIS — G8929 Other chronic pain: Secondary | ICD-10-CM | POA: Diagnosis not present

## 2016-06-20 DIAGNOSIS — R45851 Suicidal ideations: Secondary | ICD-10-CM | POA: Diagnosis not present

## 2016-06-20 DIAGNOSIS — R51 Headache: Secondary | ICD-10-CM | POA: Diagnosis not present

## 2016-06-20 DIAGNOSIS — Z046 Encounter for general psychiatric examination, requested by authority: Secondary | ICD-10-CM | POA: Diagnosis not present

## 2016-06-20 DIAGNOSIS — Z79899 Other long term (current) drug therapy: Secondary | ICD-10-CM | POA: Diagnosis not present

## 2016-06-20 DIAGNOSIS — G935 Compression of brain: Secondary | ICD-10-CM | POA: Diagnosis not present

## 2016-06-20 DIAGNOSIS — I10 Essential (primary) hypertension: Secondary | ICD-10-CM | POA: Diagnosis not present

## 2016-06-21 DIAGNOSIS — G935 Compression of brain: Secondary | ICD-10-CM | POA: Diagnosis not present

## 2016-06-21 DIAGNOSIS — R51 Headache: Secondary | ICD-10-CM | POA: Diagnosis not present

## 2016-06-21 DIAGNOSIS — I10 Essential (primary) hypertension: Secondary | ICD-10-CM | POA: Diagnosis not present

## 2016-06-21 DIAGNOSIS — Z79899 Other long term (current) drug therapy: Secondary | ICD-10-CM | POA: Diagnosis not present

## 2016-06-21 DIAGNOSIS — K529 Noninfective gastroenteritis and colitis, unspecified: Secondary | ICD-10-CM | POA: Diagnosis not present

## 2016-06-21 DIAGNOSIS — G8929 Other chronic pain: Secondary | ICD-10-CM | POA: Diagnosis not present

## 2016-06-21 DIAGNOSIS — F332 Major depressive disorder, recurrent severe without psychotic features: Secondary | ICD-10-CM | POA: Diagnosis not present

## 2016-06-21 DIAGNOSIS — D649 Anemia, unspecified: Secondary | ICD-10-CM | POA: Diagnosis not present

## 2016-06-21 DIAGNOSIS — F3181 Bipolar II disorder: Secondary | ICD-10-CM | POA: Diagnosis not present

## 2016-06-21 DIAGNOSIS — Z046 Encounter for general psychiatric examination, requested by authority: Secondary | ICD-10-CM | POA: Diagnosis not present

## 2016-06-21 DIAGNOSIS — R45851 Suicidal ideations: Secondary | ICD-10-CM | POA: Diagnosis not present

## 2016-06-22 DIAGNOSIS — F332 Major depressive disorder, recurrent severe without psychotic features: Secondary | ICD-10-CM | POA: Diagnosis not present

## 2016-06-23 DIAGNOSIS — F332 Major depressive disorder, recurrent severe without psychotic features: Secondary | ICD-10-CM | POA: Diagnosis not present

## 2016-06-29 DIAGNOSIS — F332 Major depressive disorder, recurrent severe without psychotic features: Secondary | ICD-10-CM | POA: Diagnosis not present

## 2016-07-02 DIAGNOSIS — F331 Major depressive disorder, recurrent, moderate: Secondary | ICD-10-CM | POA: Diagnosis not present

## 2016-07-08 DIAGNOSIS — F339 Major depressive disorder, recurrent, unspecified: Secondary | ICD-10-CM | POA: Diagnosis not present

## 2016-07-16 IMAGING — MR MR HEAD W/O CM
8 of 10 series · 38 of 48 positions shown · non-contrast
Comparison: [facility] brain MRI [DATE], intracranial MRV
12/13/2014, and earlier

CLINICAL DATA: 34-year-old female with dizziness, unstable gait,
nausea, headache with right eye pain. Right eye vision loss.
Bilateral fourth and fifth finger numbness. Symptoms since Teruo.
Subsequent encounter.

EXAM:
MRI HEAD WITHOUT CONTRAST
TECHNIQUE: Multiplanar, multiecho pulse sequences of the brain and surrounding
structures were obtained without intravenous contrast.

[Series 3: T1 · sagittal · 5.0mm · 0.47mm/px · 2 of 24 slices shown]
[im 1/24]
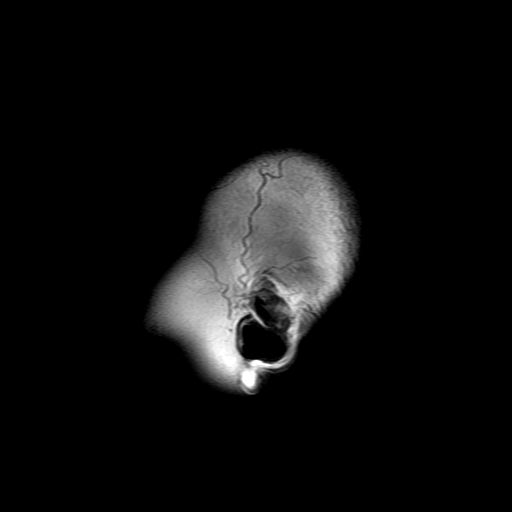
[im 24/24]
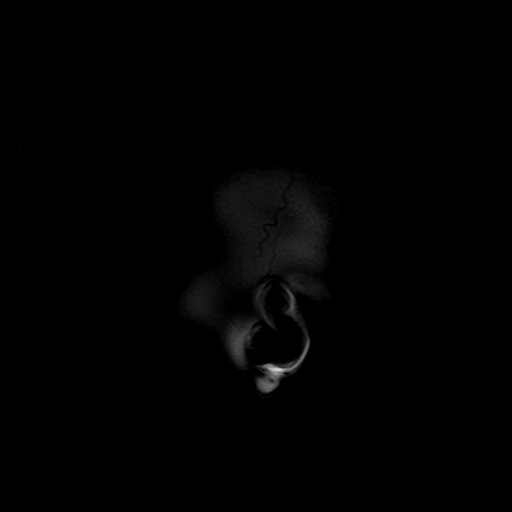

[Series 4: DWI · axial · 3.0mm · 1.09mm/px · z∈[-81,+63]mm · 11 of 100 slices shown (1 of 4)]
[im 1/100]
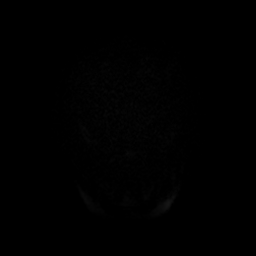
[im 10/100]
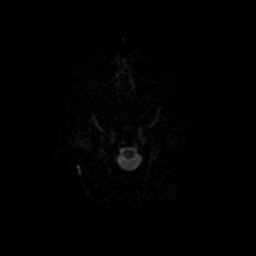
[im 20/100]
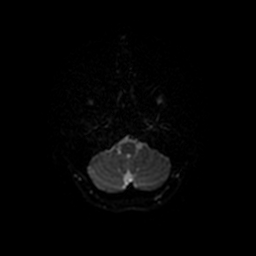
[im 30/100]
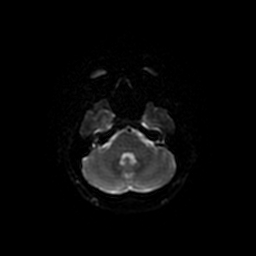
[im 40/100]
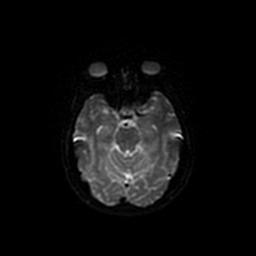
[im 50/100]
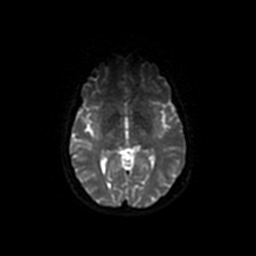
[im 60/100]
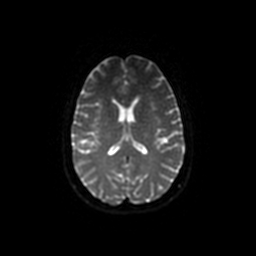
[im 70/100]
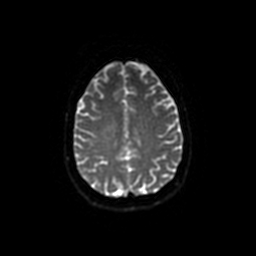
[im 80/100]
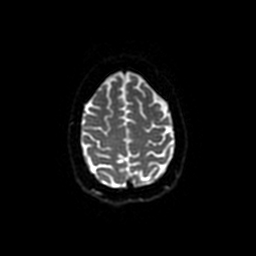
[im 90/100]
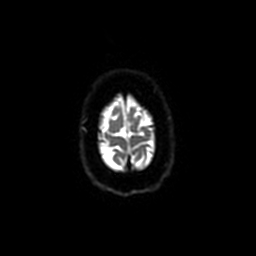
[im 100/100]
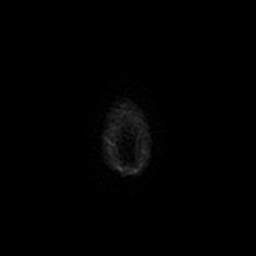

[Series 5: DWI · coronal · 5.0mm · 1.09mm/px · 7 of 67 slices shown (2 of 4)]
[im 1/67]
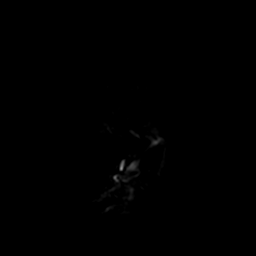
[im 12/67]
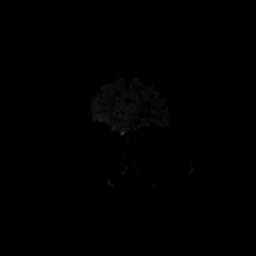
[im 23/67]
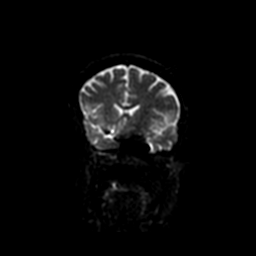
[im 34/67]
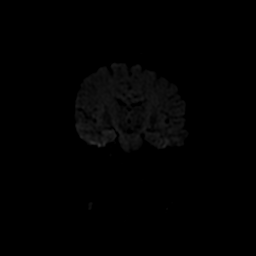
[im 45/67]
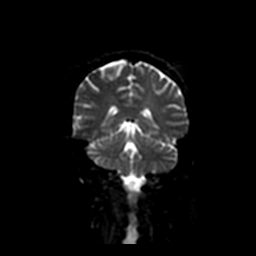
[im 56/67]
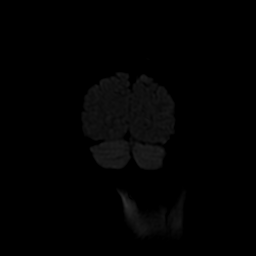
[im 67/67]
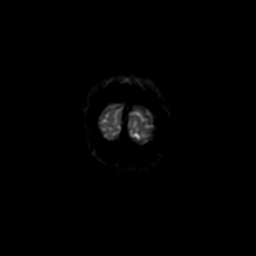

[Series 6: T2 · axial · 5.0mm · 0.43mm/px · z∈[-91,+60]mm · 3 of 25 slices shown (1 of 2)]
[im 1/25]
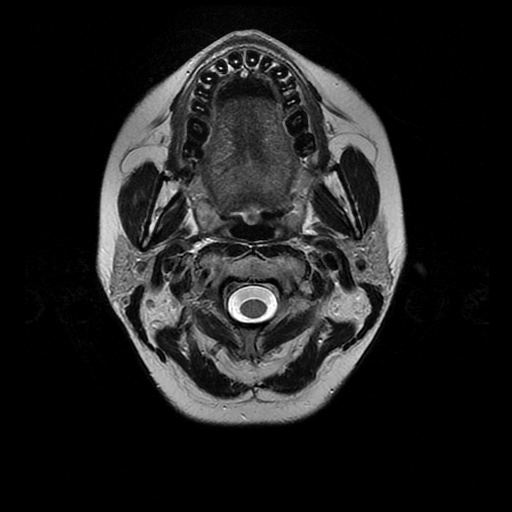
[im 13/25]
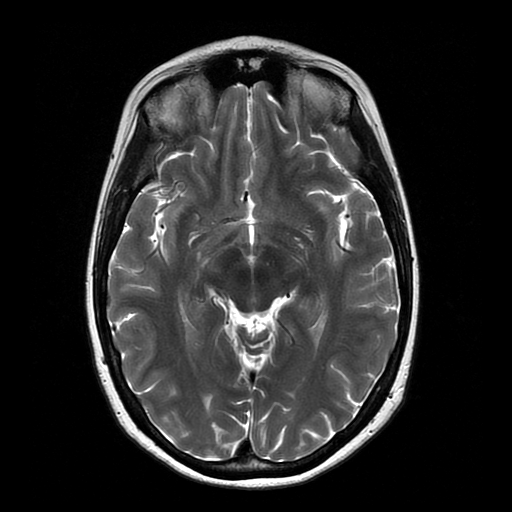
[im 25/25]
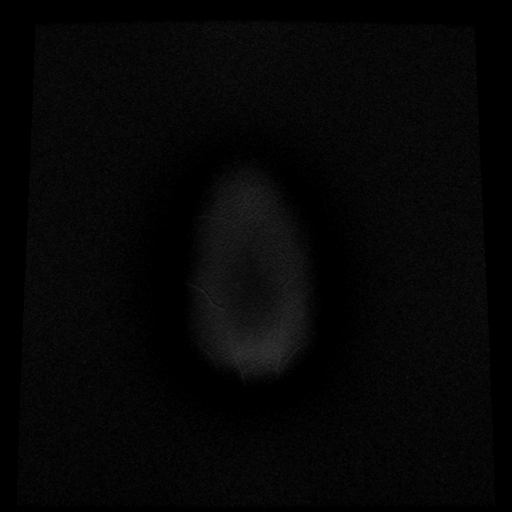

[Series 7: FLAIR · axial · 5.0mm · 0.43mm/px · z∈[-97,+66]mm · 3 of 25 slices shown]
[im 1/25]
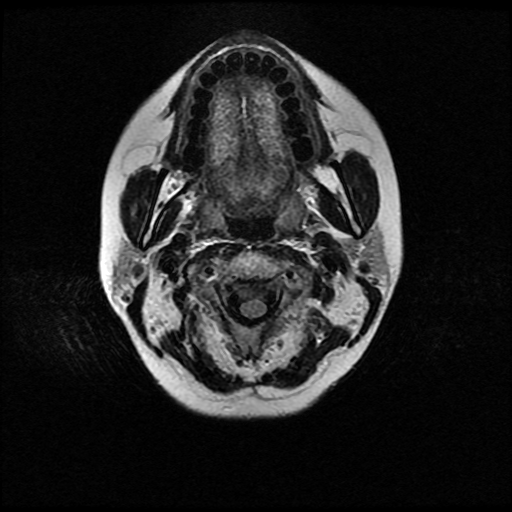
[im 13/25]
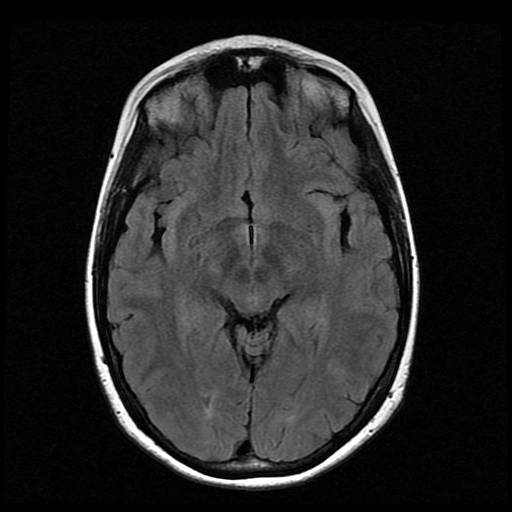
[im 25/25]
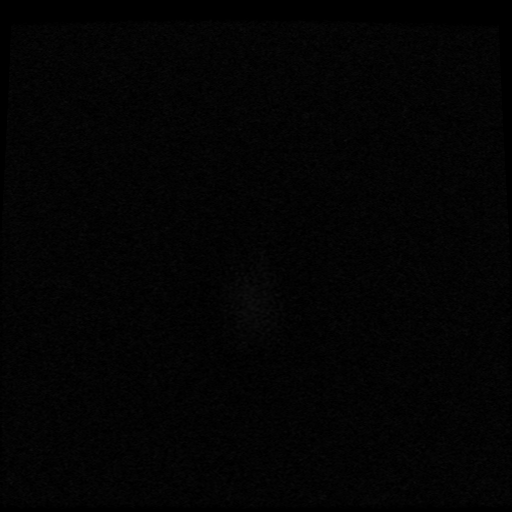

[Series 10: T2 · coronal · 5.0mm · 0.45mm/px · 3 of 27 slices shown (2 of 2)]
[im 1/27]
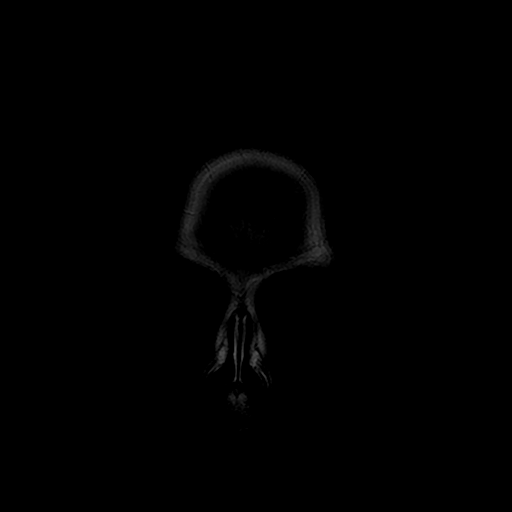
[im 14/27]
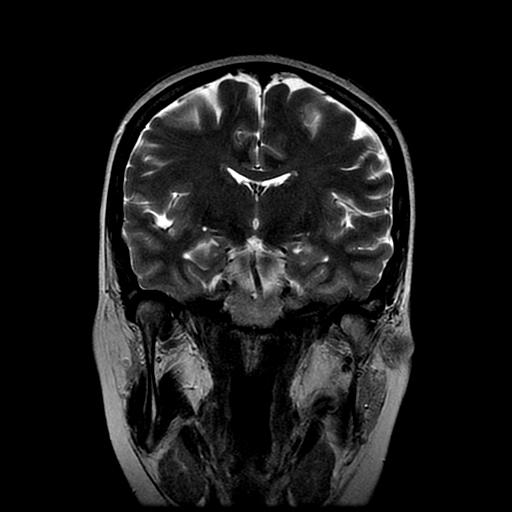
[im 27/27]
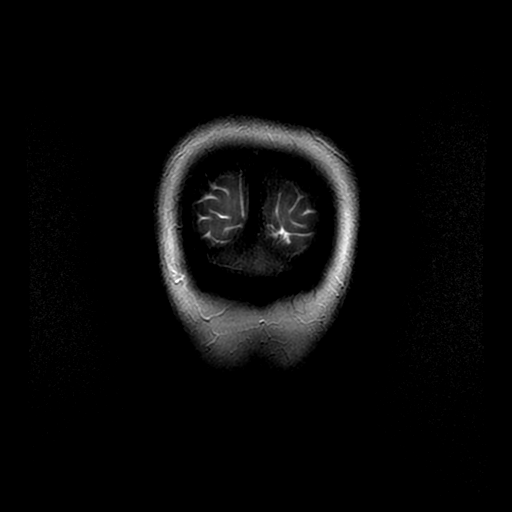

[Series 400: DWI · axial · 3.0mm · 1.09mm/px · z∈[-81,+63]mm · 5 of 50 slices shown (3 of 4)]
[im 1/50]
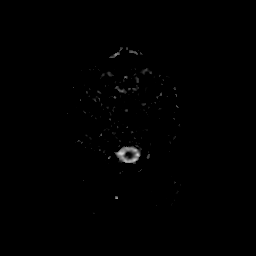
[im 13/50]
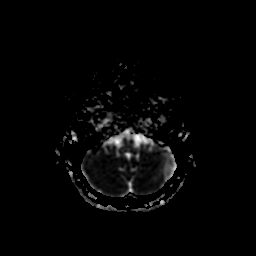
[im 25/50]
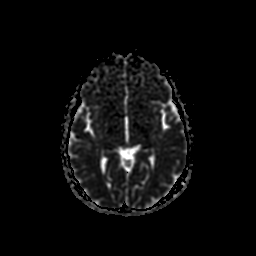
[im 37/50]
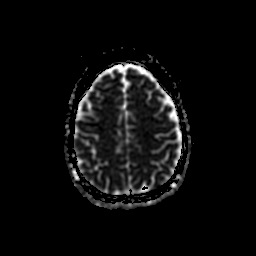
[im 50/50]
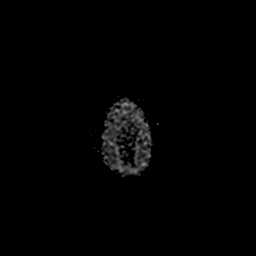

[Series 500: DWI · coronal · 5.0mm · 1.09mm/px · 4 of 34 slices shown (4 of 4)]
[im 1/34]
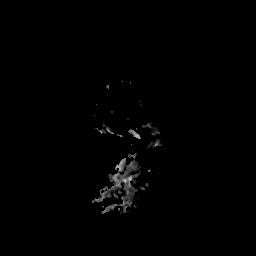
[im 12/34]
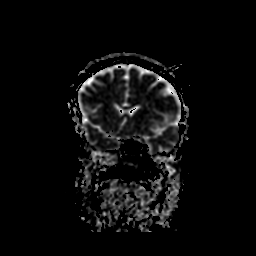
[im 23/34]
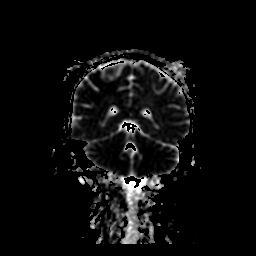
[im 34/34]
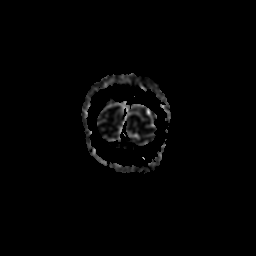

[38 of 48 positions shown; findings below may reference images not displayed]

FINDINGS: Cerebral volume remains normal. No restricted diffusion to suggest
acute infarction. No midline shift, mass effect, evidence of mass
lesion, ventriculomegaly, extra-axial collection or acute
intracranial hemorrhage. Pituitary is within normal limits.

Cervicomedullary junction is within normal limits. Negative
visualized cervical spine. Suprasellar cistern patency appears
normal. No dural thickening or enhancement on the Teruo comparison.

Major intracranial vascular flow voids are stable. Gray and white
matter signal is stable and normal for age throughout the brain.

Visible internal auditory structures appear normal. Mastoids are
clear. Paranasal sinuses are clear. Orbit and scalp soft tissues
appear normal. Normal bone marrow signal.
IMPRESSION: Stable and normal for age noncontrast MRI appearance of the brain.

## 2016-07-17 DIAGNOSIS — F339 Major depressive disorder, recurrent, unspecified: Secondary | ICD-10-CM | POA: Diagnosis not present

## 2016-07-21 DIAGNOSIS — F339 Major depressive disorder, recurrent, unspecified: Secondary | ICD-10-CM | POA: Diagnosis not present

## 2016-07-24 DIAGNOSIS — F411 Generalized anxiety disorder: Secondary | ICD-10-CM | POA: Diagnosis not present

## 2016-07-24 DIAGNOSIS — F3340 Major depressive disorder, recurrent, in remission, unspecified: Secondary | ICD-10-CM | POA: Diagnosis not present

## 2016-07-28 DIAGNOSIS — G43709 Chronic migraine without aura, not intractable, without status migrainosus: Secondary | ICD-10-CM | POA: Diagnosis not present

## 2016-07-28 DIAGNOSIS — F411 Generalized anxiety disorder: Secondary | ICD-10-CM | POA: Diagnosis not present

## 2016-07-28 DIAGNOSIS — M150 Primary generalized (osteo)arthritis: Secondary | ICD-10-CM | POA: Diagnosis not present

## 2016-07-28 DIAGNOSIS — F3340 Major depressive disorder, recurrent, in remission, unspecified: Secondary | ICD-10-CM | POA: Diagnosis not present

## 2016-07-28 DIAGNOSIS — M545 Low back pain: Secondary | ICD-10-CM | POA: Diagnosis not present

## 2016-07-28 DIAGNOSIS — K219 Gastro-esophageal reflux disease without esophagitis: Secondary | ICD-10-CM | POA: Diagnosis not present

## 2016-08-08 DIAGNOSIS — F3340 Major depressive disorder, recurrent, in remission, unspecified: Secondary | ICD-10-CM | POA: Diagnosis not present

## 2016-08-08 DIAGNOSIS — F411 Generalized anxiety disorder: Secondary | ICD-10-CM | POA: Diagnosis not present

## 2016-08-19 DIAGNOSIS — R197 Diarrhea, unspecified: Secondary | ICD-10-CM | POA: Diagnosis not present

## 2016-08-22 ENCOUNTER — Ambulatory Visit: Payer: Self-pay | Admitting: Allergy

## 2016-08-22 ENCOUNTER — Ambulatory Visit (INDEPENDENT_AMBULATORY_CARE_PROVIDER_SITE_OTHER): Payer: BLUE CROSS/BLUE SHIELD | Admitting: Allergy

## 2016-08-22 ENCOUNTER — Encounter: Payer: Self-pay | Admitting: Allergy

## 2016-08-22 VITALS — BP 130/80 | HR 84 | Temp 98.3°F | Resp 16 | Ht 63.0 in | Wt 189.2 lb

## 2016-08-22 DIAGNOSIS — K52832 Lymphocytic colitis: Secondary | ICD-10-CM | POA: Diagnosis not present

## 2016-08-22 NOTE — Progress Notes (Signed)
New Patient Note  RE: [name] MRN: [id number] DOB: 04-07-1981 Date of Office Visit: 08/22/2016  Referring provider: Shelbie Ammons, MD Primary care provider: Galvin Proffer, MD  Chief Complaint: food allergy testing  History of present illness: Kayla Holden is a 36 y.o. female presenting today for consultation for food allergy testing.  She was referred by Dr. Lynann Bologna her GI specialist for food allergy evaluation.  She has a history of lymphocytic colitis and has been evaluated at [facility] and [facility].  She is currently on Entocort, bentyl for her GI issues.  She reports having right sided quadrant that is rather constant.  She also has nausea, vomiting, diarrhea, fecal incontinence, daily persistent headache (she has history of chiari malformation).   Symptoms can be worse after meals.  She has not needed any specific foods that seem to trigger her GI symptoms.  She denies any hives/rash, respiratory or Cv.  These symptoms have been ongoing for the past 2.5 years.    Review of systems: Review of Systems  Constitutional: Negative for chills, fever, malaise/fatigue and weight loss.  HENT: Negative for congestion, ear discharge, ear pain, sinus pain and sore throat.   Eyes: Negative for discharge and redness.  Respiratory: Negative for cough, shortness of breath and wheezing.   Cardiovascular: Negative for chest pain.  Gastrointestinal: Positive for abdominal pain, diarrhea, nausea and vomiting.  Skin: Negative for itching and rash.  Neurological: Positive for headaches.    All other systems negative unless noted above in HPI  Past medical history: Past Medical History:  Diagnosis Date  . Anxiety   . Chiari malformation   . Depression   . GERD (gastroesophageal reflux disease)   . Microscopic colitis   . Migraine headache     Past surgical history: Past Surgical History:  Procedure Laterality Date  . APPENDECTOMY    . CHOLECYSTECTOMY    . KNEE SURGERY Left    3 times    Family history:  Family History  Problem Relation Age of Onset  . Cancer Maternal Grandmother   . Cancer Paternal Grandfather   . Allergic rhinitis Neg Hx   . Angioedema Neg Hx   . Asthma Neg Hx   . Atopy Neg Hx   . Eczema Neg Hx   . Urticaria Neg Hx   . Immunodeficiency Neg Hx     Social history: Lives in a apartment without carpeting with gas and electric heating and central cooling.  There is a dog in the home.  There are dogs and cats outside the home.  There is no concern for water damage or mildew or roaches in the home.  She is exposed to tobacco smoke from neighbros.  She is currently on medical leave.     Medication List: Allergies as of 08/22/2016      Reactions   Isoniazid Shortness Of Breath   Rifampin Shortness Of Breath   Butalbital-aspirin-caffeine    Ketoprofen Other (See Comments)   Abdominal pain   Clindamycin/lincomycin Other (See Comments)   Abdominal pain      Medication List       Accurate as of 08/22/16  3:59 PM. Always use your most recent med list.          ARIPiprazole 5 MG tablet Commonly known as:  ABILIFY Take 5 mg by mouth daily.   B-D 3CC LUER-LOK SYR 25GX1" 25G X 1" 3 ML Misc Generic drug:  SYRINGE-NEEDLE (DISP) 3 ML USE AS DIRECTED  WITH B12   budesonide 3 MG 24 hr capsule Commonly known as:  ENTOCORT EC Take 9 mg by mouth every morning.   clonazePAM 2 MG tablet Commonly known as:  KLONOPIN Take 2 mg by mouth 2 (two) times daily.   cyanocobalamin 1000 MCG/ML injection Commonly known as:  (VITAMIN B-12) INJECT 1ML WEEKLY   dicyclomine 10 MG capsule Commonly known as:  BENTYL TAKE 2 TABLET 4 TIMES A DAY FOR 30 DAYS ORALLY   DULoxetine 60 MG capsule Commonly known as:  CYMBALTA Take 120 mg by mouth daily.   HYDROmorphone 4 MG tablet Commonly known as:  DILAUDID TAKE ONE TABLET BY MOUTH EVERY SIX TO EIGHT HOURS AS NEEDED FOR SEVERE PAIN ONLY   hydrOXYzine 25 MG capsule Commonly known as:  VISTARIL TAKE ONE  CAPSULE BY MOUTH EVERY 6 HOURS AS NEEDED   ondansetron 8 MG disintegrating tablet Commonly known as:  ZOFRAN-ODT 1 TABLET ON THE TONGUE AND ALLOW TO DISSOLVE EVERY 8 HRS ORALLY 30 DAYS   PREVALITE 4 g packet Generic drug:  cholestyramine light TAKE 1 PACKET BY MOUTH TWICE DAILY BEFORE MEALS   propranolol 40 MG tablet Commonly known as:  INDERAL TAKE 1 AND 1/2 TABLETS BY MOUTH 2 TIMES A DAY   ranitidine 150 MG tablet Commonly known as:  ZANTAC Take 150 mg by mouth 2 (two) times daily.   temazepam 30 MG capsule Commonly known as:  RESTORIL Take 30 mg by mouth at bedtime.   zonisamide 100 MG capsule Commonly known as:  ZONEGRAN TAKE 2 CAPSULES BY ORAL ROUTE DAILY FOR 30 DAYS   zonisamide 25 MG capsule Commonly known as:  ZONEGRAN TAKE 2 CAPSULES BY ORAL ROUTE ONCE A DAY (AT BEDTIME) FOR 30 DAYS       Known medication allergies: Allergies  Allergen Reactions  . Isoniazid Shortness Of Breath  . Rifampin Shortness Of Breath  . Butalbital-Aspirin-Caffeine   . Ketoprofen Other (See Comments)    Abdominal pain  . Clindamycin/Lincomycin Other (See Comments)    Abdominal pain     Physical examination: Blood pressure 130/80, pulse 84, temperature 98.3 F (36.8 C), temperature source Oral, resp. rate 16, height 5\' 3"  (1.6 m), weight 189 lb 3.2 oz (85.8 kg).  General: Alert, interactive, in no acute distress. HEENT: TMs pearly gray, turbinates non-edematous without discharge, post-pharynx non erythematous. Neck: Supple without lymphadenopathy. Lungs: Clear to auscultation without wheezing, rhonchi or rales. {no increased work of breathing. CV: Normal S1, S2 without murmurs. Abdomen: Nondistended, nontender. Skin: Warm and dry, without lesions or rashes. Extremities:  No clubbing, cyanosis or edema. Neuro:   Grossly intact.  Diagnositics/Labs:  Allergy testing: unable to perform due to recent antihistamine use  Assessment and plan:    Lymphocytic colitis    - per  GI request would like pt to have food allergy ruled out as possible trigger of her GI symptoms.       - unable to perform testing today due to recent vistaril use     - in mot cases with lymphocytic colitis IgE mechanism is not contributing factor to symptoms however given her symptoms can be exacerbated by food ingestion reasonable to perform skin prick testing to assess food sensitivity.   If she does have any positive reactions on skin prick testing would advise that she avoid this food in the diet to see if she has any improvement in her symptoms.       - she will return on Tuesday  08/26/16 for  testing.   Do not take any antihistamines starting today (including vistaril, Claritin, Allegra, Zyrtec, benadryl, cough/cold medicines with PM components).   I appreciate the opportunity to take part in Knox's care. Please do not hesitate to contact me with questions  Sincerely,   Margo Aye, MD Allergy/Immunology [facility]

## 2016-08-22 NOTE — Patient Instructions (Addendum)
Come back for food allergy testing on Tuesday  08/26/16.  Do not take any antihistamines starting today (including vistaril, Claritin, Allegra, Zyrtec, benadryl, cough/cold medicines with PM components).

## 2016-08-25 DIAGNOSIS — F411 Generalized anxiety disorder: Secondary | ICD-10-CM | POA: Diagnosis not present

## 2016-08-25 DIAGNOSIS — F3340 Major depressive disorder, recurrent, in remission, unspecified: Secondary | ICD-10-CM | POA: Diagnosis not present

## 2016-08-26 ENCOUNTER — Encounter: Payer: Self-pay | Admitting: Allergy

## 2016-08-26 ENCOUNTER — Ambulatory Visit (INDEPENDENT_AMBULATORY_CARE_PROVIDER_SITE_OTHER): Payer: BLUE CROSS/BLUE SHIELD | Admitting: Allergy

## 2016-08-26 VITALS — BP 118/74 | HR 78 | Resp 16

## 2016-08-26 DIAGNOSIS — K52832 Lymphocytic colitis: Secondary | ICD-10-CM

## 2016-08-26 DIAGNOSIS — T781XXD Other adverse food reactions, not elsewhere classified, subsequent encounter: Secondary | ICD-10-CM

## 2016-08-26 NOTE — Progress Notes (Signed)
Follow-up Note  RE: Kayla Holden MRN: [id number] DOB: 1980-10-12 Date of Holden Visit: 08/26/2016   History of present illness: Kayla Holden is a 36 y.o. female presenting today for skin testing to foods.  She presented for initial visit on 08/22/16 for food allergy testing per Gi, Kayla Holden.  Unable to perform testing as she took antihistamine as she reports being told by Kayla Holden to hold antihistamine for 24hr.   Histamine control was not reactive.  She has been on all antihistamine for 72hrs for testing today.      Review of systems: Review of Systems  Gastrointestinal: Positive for abdominal pain, diarrhea, nausea and vomiting.  All other systems reviewed and are negative.   All other systems negative unless noted above in HPI  Past medical/social/surgical/family history have been reviewed and are unchanged unless specifically indicated below.  No changes  Medication List: Allergies as of 08/26/2016      Reactions   Isoniazid Shortness Of Breath   Rifampin Shortness Of Breath   Butalbital-aspirin-caffeine    Ketoprofen Other (See Comments)   Abdominal pain   Clindamycin/lincomycin Other (See Comments)   Abdominal pain      Medication List       Accurate as of 08/26/16  2:53 PM. Always use your most recent med list.          ARIPiprazole 5 MG tablet Commonly known as:  ABILIFY Take 5 mg by mouth daily.   B-D 3CC LUER-LOK SYR 25GX1" 25G X 1" 3 ML Misc Generic drug:  SYRINGE-NEEDLE (DISP) 3 ML USE AS DIRECTED WITH B12   budesonide 3 MG 24 hr capsule Commonly known as:  ENTOCORT EC Take 9 mg by mouth every morning.   clonazePAM 2 MG tablet Commonly known as:  KLONOPIN Take 2 mg by mouth 2 (two) times daily.   cyanocobalamin 1000 MCG/ML injection Commonly known as:  (VITAMIN B-12) INJECT WEEKLY   dicyclomine 10 MG capsule Commonly known as:  BENTYL TAKE 2 TABLET 4 TIMES A DAY FOR 30 DAYS ORALLY   DULoxetine 60 MG capsule Commonly  known as:  CYMBALTA Take 120 mg by mouth daily.   HYDROmorphone 4 MG tablet Commonly known as:  DILAUDID TAKE ONE TABLET BY MOUTH EVERY SIX TO EIGHT HOURS AS NEEDED FOR SEVERE PAIN ONLY   hydrOXYzine 25 MG capsule Commonly known as:  VISTARIL TAKE ONE CAPSULE BY MOUTH EVERY 6 HOURS AS NEEDED   ondansetron 8 MG disintegrating tablet Commonly known as:  ZOFRAN-ODT 1 TABLET ON THE TONGUE AND ALLOW TO DISSOLVE EVERY 8 HRS ORALLY 30 DAYS   PREVALITE 4 g packet Generic drug:  cholestyramine light TAKE 1 PACKET BY MOUTH TWICE DAILY BEFORE MEALS   propranolol 40 MG tablet Commonly known as:  INDERAL TAKE 1 AND 1/2 TABLETS BY MOUTH 2 TIMES A DAY   ranitidine 150 MG tablet Commonly known as:  ZANTAC Take 150 mg by mouth 2 (two) times daily.   temazepam 30 MG capsule Commonly known as:  RESTORIL Take 30 mg by mouth at bedtime.   zonisamide 100 MG capsule Commonly known as:  ZONEGRAN TAKE 2 CAPSULES BY ORAL ROUTE DAILY FOR 30 DAYS   zonisamide 25 MG capsule Commonly known as:  ZONEGRAN TAKE 2 CAPSULES BY ORAL ROUTE ONCE A DAY (AT BEDTIME) FOR 30 DAYS       Known medication allergies: Allergies  Allergen Reactions  . Isoniazid Shortness Of Breath  . Rifampin Shortness Of  Breath  . Butalbital-Aspirin-Caffeine   . Ketoprofen Other (See Comments)    Abdominal pain  . Clindamycin/Lincomycin Other (See Comments)    Abdominal pain     Physical examination: Blood pressure 118/74, pulse 78, resp. rate 16.  General: Alert, interactive, in no acute distress. CV: Normal S1, S2 without murmurs. Abdomen: Nondistended, nontender. Skin: Warm and dry, without lesions or rashes. Extremities:  No clubbing, cyanosis or edema. Neuro:   Grossly intact.  Diagnositics/Labs:  Allergy testing: negative for common allergenic foods Allergy testing results were read and interpreted by provider, documented by clinical staff.   Assessment and plan:   Lymphocytic colitis/adverse food  reaction  -  Food allergy testing to most common allergenic food groups (peanut, cashew, soy, wheat, corn, milk, egg, casein, shellfish, fish) were negative.    - she does not have an evidence of IgE mediated food allergy and thus does not need to avoid any foods based on today's testing.    Follow-up as needed  I appreciate the opportunity to take part in [name]'s care. Please do not hesitate to contact me with questions.  Sincerely,   Kayla AyeShaylar Padgett, MD Allergy/Immunology [facility]

## 2016-08-26 NOTE — Patient Instructions (Addendum)
Food allergy testing to most common allergenic food groups (peanut, cashew, soy, wheat, corn, milk, egg, casein, shellfish, fish were negative.    Follow-up as needed

## 2016-08-27 DIAGNOSIS — F3340 Major depressive disorder, recurrent, in remission, unspecified: Secondary | ICD-10-CM | POA: Diagnosis not present

## 2016-08-27 DIAGNOSIS — F411 Generalized anxiety disorder: Secondary | ICD-10-CM | POA: Diagnosis not present

## 2016-08-29 DIAGNOSIS — M150 Primary generalized (osteo)arthritis: Secondary | ICD-10-CM | POA: Diagnosis not present

## 2016-08-29 DIAGNOSIS — K219 Gastro-esophageal reflux disease without esophagitis: Secondary | ICD-10-CM | POA: Diagnosis not present

## 2016-08-29 DIAGNOSIS — F411 Generalized anxiety disorder: Secondary | ICD-10-CM | POA: Diagnosis not present

## 2016-08-29 DIAGNOSIS — G43709 Chronic migraine without aura, not intractable, without status migrainosus: Secondary | ICD-10-CM | POA: Diagnosis not present

## 2016-09-10 DIAGNOSIS — F3340 Major depressive disorder, recurrent, in remission, unspecified: Secondary | ICD-10-CM | POA: Diagnosis not present

## 2016-09-10 DIAGNOSIS — F411 Generalized anxiety disorder: Secondary | ICD-10-CM | POA: Diagnosis not present

## 2016-09-11 DIAGNOSIS — F411 Generalized anxiety disorder: Secondary | ICD-10-CM | POA: Diagnosis not present

## 2016-09-11 DIAGNOSIS — F3340 Major depressive disorder, recurrent, in remission, unspecified: Secondary | ICD-10-CM | POA: Diagnosis not present

## 2016-09-25 DIAGNOSIS — F411 Generalized anxiety disorder: Secondary | ICD-10-CM | POA: Diagnosis not present

## 2016-09-25 DIAGNOSIS — F3340 Major depressive disorder, recurrent, in remission, unspecified: Secondary | ICD-10-CM | POA: Diagnosis not present

## 2016-09-26 DIAGNOSIS — F3340 Major depressive disorder, recurrent, in remission, unspecified: Secondary | ICD-10-CM | POA: Diagnosis not present

## 2016-09-26 DIAGNOSIS — F411 Generalized anxiety disorder: Secondary | ICD-10-CM | POA: Diagnosis not present

## 2016-09-29 DIAGNOSIS — F331 Major depressive disorder, recurrent, moderate: Secondary | ICD-10-CM | POA: Diagnosis not present

## 2016-09-29 DIAGNOSIS — F332 Major depressive disorder, recurrent severe without psychotic features: Secondary | ICD-10-CM | POA: Diagnosis not present

## 2016-09-29 DIAGNOSIS — M545 Low back pain: Secondary | ICD-10-CM | POA: Diagnosis not present

## 2016-09-29 DIAGNOSIS — F411 Generalized anxiety disorder: Secondary | ICD-10-CM | POA: Diagnosis not present

## 2016-10-02 DIAGNOSIS — F411 Generalized anxiety disorder: Secondary | ICD-10-CM | POA: Diagnosis not present

## 2016-10-02 DIAGNOSIS — F3340 Major depressive disorder, recurrent, in remission, unspecified: Secondary | ICD-10-CM | POA: Diagnosis not present

## 2016-10-09 DIAGNOSIS — F3340 Major depressive disorder, recurrent, in remission, unspecified: Secondary | ICD-10-CM | POA: Diagnosis not present

## 2016-10-09 DIAGNOSIS — F411 Generalized anxiety disorder: Secondary | ICD-10-CM | POA: Diagnosis not present

## 2016-10-22 DIAGNOSIS — F3340 Major depressive disorder, recurrent, in remission, unspecified: Secondary | ICD-10-CM | POA: Diagnosis not present

## 2016-10-22 DIAGNOSIS — F411 Generalized anxiety disorder: Secondary | ICD-10-CM | POA: Diagnosis not present

## 2016-10-23 DIAGNOSIS — F411 Generalized anxiety disorder: Secondary | ICD-10-CM | POA: Diagnosis not present

## 2016-10-23 DIAGNOSIS — F3340 Major depressive disorder, recurrent, in remission, unspecified: Secondary | ICD-10-CM | POA: Diagnosis not present

## 2016-10-29 DIAGNOSIS — K52839 Microscopic colitis, unspecified: Secondary | ICD-10-CM | POA: Diagnosis not present

## 2016-10-29 DIAGNOSIS — R5383 Other fatigue: Secondary | ICD-10-CM | POA: Diagnosis not present

## 2016-10-29 DIAGNOSIS — R51 Headache: Secondary | ICD-10-CM | POA: Diagnosis not present

## 2016-10-29 DIAGNOSIS — R5381 Other malaise: Secondary | ICD-10-CM | POA: Diagnosis not present

## 2016-11-06 DIAGNOSIS — F411 Generalized anxiety disorder: Secondary | ICD-10-CM | POA: Diagnosis not present

## 2016-11-06 DIAGNOSIS — F3340 Major depressive disorder, recurrent, in remission, unspecified: Secondary | ICD-10-CM | POA: Diagnosis not present

## 2016-11-13 DIAGNOSIS — F3340 Major depressive disorder, recurrent, in remission, unspecified: Secondary | ICD-10-CM | POA: Diagnosis not present

## 2016-11-13 DIAGNOSIS — F411 Generalized anxiety disorder: Secondary | ICD-10-CM | POA: Diagnosis not present

## 2016-11-27 DIAGNOSIS — F3340 Major depressive disorder, recurrent, in remission, unspecified: Secondary | ICD-10-CM | POA: Diagnosis not present

## 2016-11-27 DIAGNOSIS — F411 Generalized anxiety disorder: Secondary | ICD-10-CM | POA: Diagnosis not present

## 2016-11-28 DIAGNOSIS — K219 Gastro-esophageal reflux disease without esophagitis: Secondary | ICD-10-CM | POA: Diagnosis not present

## 2016-11-28 DIAGNOSIS — M150 Primary generalized (osteo)arthritis: Secondary | ICD-10-CM | POA: Diagnosis not present

## 2016-11-28 DIAGNOSIS — G43709 Chronic migraine without aura, not intractable, without status migrainosus: Secondary | ICD-10-CM | POA: Diagnosis not present

## 2016-11-28 DIAGNOSIS — M545 Low back pain: Secondary | ICD-10-CM | POA: Diagnosis not present

## 2016-12-01 DIAGNOSIS — R1084 Generalized abdominal pain: Secondary | ICD-10-CM | POA: Diagnosis not present

## 2016-12-04 DIAGNOSIS — F3340 Major depressive disorder, recurrent, in remission, unspecified: Secondary | ICD-10-CM | POA: Diagnosis not present

## 2016-12-04 DIAGNOSIS — F411 Generalized anxiety disorder: Secondary | ICD-10-CM | POA: Diagnosis not present

## 2016-12-08 DIAGNOSIS — I700 Atherosclerosis of aorta: Secondary | ICD-10-CM | POA: Diagnosis not present

## 2016-12-08 DIAGNOSIS — R160 Hepatomegaly, not elsewhere classified: Secondary | ICD-10-CM | POA: Diagnosis not present

## 2016-12-08 DIAGNOSIS — K760 Fatty (change of) liver, not elsewhere classified: Secondary | ICD-10-CM | POA: Diagnosis not present

## 2016-12-08 DIAGNOSIS — K449 Diaphragmatic hernia without obstruction or gangrene: Secondary | ICD-10-CM | POA: Diagnosis not present

## 2016-12-11 DIAGNOSIS — F411 Generalized anxiety disorder: Secondary | ICD-10-CM | POA: Diagnosis not present

## 2016-12-11 DIAGNOSIS — F3340 Major depressive disorder, recurrent, in remission, unspecified: Secondary | ICD-10-CM | POA: Diagnosis not present

## 2016-12-18 DIAGNOSIS — F3340 Major depressive disorder, recurrent, in remission, unspecified: Secondary | ICD-10-CM | POA: Diagnosis not present

## 2016-12-18 DIAGNOSIS — F411 Generalized anxiety disorder: Secondary | ICD-10-CM | POA: Diagnosis not present

## 2016-12-29 DIAGNOSIS — G43709 Chronic migraine without aura, not intractable, without status migrainosus: Secondary | ICD-10-CM | POA: Diagnosis not present

## 2016-12-29 DIAGNOSIS — M150 Primary generalized (osteo)arthritis: Secondary | ICD-10-CM | POA: Diagnosis not present

## 2016-12-29 DIAGNOSIS — M545 Low back pain: Secondary | ICD-10-CM | POA: Diagnosis not present

## 2016-12-29 DIAGNOSIS — F411 Generalized anxiety disorder: Secondary | ICD-10-CM | POA: Diagnosis not present

## 2016-12-30 DIAGNOSIS — F3340 Major depressive disorder, recurrent, in remission, unspecified: Secondary | ICD-10-CM | POA: Diagnosis not present

## 2016-12-30 DIAGNOSIS — F411 Generalized anxiety disorder: Secondary | ICD-10-CM | POA: Diagnosis not present

## 2017-01-01 DIAGNOSIS — F411 Generalized anxiety disorder: Secondary | ICD-10-CM | POA: Diagnosis not present

## 2017-01-01 DIAGNOSIS — F3340 Major depressive disorder, recurrent, in remission, unspecified: Secondary | ICD-10-CM | POA: Diagnosis not present

## 2017-01-20 DIAGNOSIS — F3340 Major depressive disorder, recurrent, in remission, unspecified: Secondary | ICD-10-CM | POA: Diagnosis not present

## 2017-01-20 DIAGNOSIS — F411 Generalized anxiety disorder: Secondary | ICD-10-CM | POA: Diagnosis not present

## 2017-02-02 DIAGNOSIS — K219 Gastro-esophageal reflux disease without esophagitis: Secondary | ICD-10-CM | POA: Diagnosis not present

## 2017-02-02 DIAGNOSIS — M150 Primary generalized (osteo)arthritis: Secondary | ICD-10-CM | POA: Diagnosis not present

## 2017-02-02 DIAGNOSIS — F411 Generalized anxiety disorder: Secondary | ICD-10-CM | POA: Diagnosis not present

## 2017-02-02 DIAGNOSIS — G43709 Chronic migraine without aura, not intractable, without status migrainosus: Secondary | ICD-10-CM | POA: Diagnosis not present

## 2017-02-24 DIAGNOSIS — S99911A Unspecified injury of right ankle, initial encounter: Secondary | ICD-10-CM | POA: Diagnosis not present

## 2017-02-24 DIAGNOSIS — S93401A Sprain of unspecified ligament of right ankle, initial encounter: Secondary | ICD-10-CM | POA: Diagnosis not present

## 2017-02-24 DIAGNOSIS — M25571 Pain in right ankle and joints of right foot: Secondary | ICD-10-CM | POA: Diagnosis not present

## 2017-02-24 DIAGNOSIS — W19XXXA Unspecified fall, initial encounter: Secondary | ICD-10-CM | POA: Diagnosis not present

## 2017-02-24 DIAGNOSIS — S99921A Unspecified injury of right foot, initial encounter: Secondary | ICD-10-CM | POA: Diagnosis not present

## 2017-02-24 DIAGNOSIS — S098XXA Other specified injuries of head, initial encounter: Secondary | ICD-10-CM | POA: Diagnosis not present

## 2017-02-24 DIAGNOSIS — S8991XA Unspecified injury of right lower leg, initial encounter: Secondary | ICD-10-CM | POA: Diagnosis not present

## 2017-02-24 DIAGNOSIS — M7989 Other specified soft tissue disorders: Secondary | ICD-10-CM | POA: Diagnosis not present

## 2017-02-24 DIAGNOSIS — S93491A Sprain of other ligament of right ankle, initial encounter: Secondary | ICD-10-CM | POA: Diagnosis not present

## 2017-02-27 DIAGNOSIS — F411 Generalized anxiety disorder: Secondary | ICD-10-CM | POA: Diagnosis not present

## 2017-02-27 DIAGNOSIS — F3340 Major depressive disorder, recurrent, in remission, unspecified: Secondary | ICD-10-CM | POA: Diagnosis not present

## 2017-03-02 DIAGNOSIS — K219 Gastro-esophageal reflux disease without esophagitis: Secondary | ICD-10-CM | POA: Diagnosis not present

## 2017-03-02 DIAGNOSIS — M150 Primary generalized (osteo)arthritis: Secondary | ICD-10-CM | POA: Diagnosis not present

## 2017-03-02 DIAGNOSIS — G43709 Chronic migraine without aura, not intractable, without status migrainosus: Secondary | ICD-10-CM | POA: Diagnosis not present

## 2017-03-02 DIAGNOSIS — F411 Generalized anxiety disorder: Secondary | ICD-10-CM | POA: Diagnosis not present

## 2017-03-02 DIAGNOSIS — R011 Cardiac murmur, unspecified: Secondary | ICD-10-CM | POA: Diagnosis not present

## 2017-03-05 DIAGNOSIS — F411 Generalized anxiety disorder: Secondary | ICD-10-CM | POA: Diagnosis not present

## 2017-03-05 DIAGNOSIS — F3340 Major depressive disorder, recurrent, in remission, unspecified: Secondary | ICD-10-CM | POA: Diagnosis not present

## 2017-03-12 DIAGNOSIS — S8991XA Unspecified injury of right lower leg, initial encounter: Secondary | ICD-10-CM | POA: Diagnosis not present

## 2017-03-12 DIAGNOSIS — F3340 Major depressive disorder, recurrent, in remission, unspecified: Secondary | ICD-10-CM | POA: Diagnosis not present

## 2017-03-12 DIAGNOSIS — Q6689 Other  specified congenital deformities of feet: Secondary | ICD-10-CM | POA: Diagnosis not present

## 2017-03-12 DIAGNOSIS — F411 Generalized anxiety disorder: Secondary | ICD-10-CM | POA: Diagnosis not present

## 2017-03-17 DIAGNOSIS — F3340 Major depressive disorder, recurrent, in remission, unspecified: Secondary | ICD-10-CM | POA: Diagnosis not present

## 2017-03-17 DIAGNOSIS — F411 Generalized anxiety disorder: Secondary | ICD-10-CM | POA: Diagnosis not present

## 2017-03-29 DIAGNOSIS — M542 Cervicalgia: Secondary | ICD-10-CM | POA: Diagnosis not present

## 2017-03-29 DIAGNOSIS — R079 Chest pain, unspecified: Secondary | ICD-10-CM | POA: Diagnosis not present

## 2017-03-29 DIAGNOSIS — T1490XA Injury, unspecified, initial encounter: Secondary | ICD-10-CM | POA: Diagnosis not present

## 2017-03-29 DIAGNOSIS — S299XXA Unspecified injury of thorax, initial encounter: Secondary | ICD-10-CM | POA: Diagnosis not present

## 2017-03-29 DIAGNOSIS — F419 Anxiety disorder, unspecified: Secondary | ICD-10-CM | POA: Diagnosis not present

## 2017-03-29 DIAGNOSIS — R0789 Other chest pain: Secondary | ICD-10-CM | POA: Diagnosis not present

## 2017-03-29 DIAGNOSIS — S20211A Contusion of right front wall of thorax, initial encounter: Secondary | ICD-10-CM | POA: Diagnosis not present

## 2017-04-07 DIAGNOSIS — F3340 Major depressive disorder, recurrent, in remission, unspecified: Secondary | ICD-10-CM | POA: Diagnosis not present

## 2017-04-07 DIAGNOSIS — F411 Generalized anxiety disorder: Secondary | ICD-10-CM | POA: Diagnosis not present

## 2017-04-10 DIAGNOSIS — M79671 Pain in right foot: Secondary | ICD-10-CM | POA: Diagnosis not present

## 2017-04-10 DIAGNOSIS — M7989 Other specified soft tissue disorders: Secondary | ICD-10-CM | POA: Diagnosis not present

## 2017-04-14 DIAGNOSIS — F411 Generalized anxiety disorder: Secondary | ICD-10-CM | POA: Diagnosis not present

## 2017-04-14 DIAGNOSIS — F3340 Major depressive disorder, recurrent, in remission, unspecified: Secondary | ICD-10-CM | POA: Diagnosis not present

## 2017-04-15 DIAGNOSIS — F411 Generalized anxiety disorder: Secondary | ICD-10-CM | POA: Diagnosis not present

## 2017-04-15 DIAGNOSIS — K219 Gastro-esophageal reflux disease without esophagitis: Secondary | ICD-10-CM | POA: Diagnosis not present

## 2017-04-15 DIAGNOSIS — Z79899 Other long term (current) drug therapy: Secondary | ICD-10-CM | POA: Diagnosis not present

## 2017-04-15 DIAGNOSIS — M150 Primary generalized (osteo)arthritis: Secondary | ICD-10-CM | POA: Diagnosis not present

## 2017-04-15 DIAGNOSIS — F410 Panic disorder [episodic paroxysmal anxiety] without agoraphobia: Secondary | ICD-10-CM | POA: Diagnosis not present

## 2017-04-23 DIAGNOSIS — M19071 Primary osteoarthritis, right ankle and foot: Secondary | ICD-10-CM | POA: Diagnosis not present

## 2017-04-24 DIAGNOSIS — F411 Generalized anxiety disorder: Secondary | ICD-10-CM | POA: Diagnosis not present

## 2017-04-24 DIAGNOSIS — F3340 Major depressive disorder, recurrent, in remission, unspecified: Secondary | ICD-10-CM | POA: Diagnosis not present

## 2017-04-30 DIAGNOSIS — F411 Generalized anxiety disorder: Secondary | ICD-10-CM | POA: Diagnosis not present

## 2017-04-30 DIAGNOSIS — F3340 Major depressive disorder, recurrent, in remission, unspecified: Secondary | ICD-10-CM | POA: Diagnosis not present

## 2017-05-05 DIAGNOSIS — F3340 Major depressive disorder, recurrent, in remission, unspecified: Secondary | ICD-10-CM | POA: Diagnosis not present

## 2017-05-05 DIAGNOSIS — F411 Generalized anxiety disorder: Secondary | ICD-10-CM | POA: Diagnosis not present

## 2017-05-13 DIAGNOSIS — M150 Primary generalized (osteo)arthritis: Secondary | ICD-10-CM | POA: Diagnosis not present

## 2017-05-13 DIAGNOSIS — Z79899 Other long term (current) drug therapy: Secondary | ICD-10-CM | POA: Diagnosis not present

## 2017-05-13 DIAGNOSIS — K219 Gastro-esophageal reflux disease without esophagitis: Secondary | ICD-10-CM | POA: Diagnosis not present

## 2017-05-13 DIAGNOSIS — G360 Neuromyelitis optica [Devic]: Secondary | ICD-10-CM | POA: Diagnosis not present

## 2017-05-13 DIAGNOSIS — F411 Generalized anxiety disorder: Secondary | ICD-10-CM | POA: Diagnosis not present

## 2017-05-14 DIAGNOSIS — M19072 Primary osteoarthritis, left ankle and foot: Secondary | ICD-10-CM | POA: Diagnosis not present

## 2017-05-14 DIAGNOSIS — Q6651 Congenital pes planus, right foot: Secondary | ICD-10-CM | POA: Diagnosis not present

## 2017-05-14 DIAGNOSIS — F3340 Major depressive disorder, recurrent, in remission, unspecified: Secondary | ICD-10-CM | POA: Diagnosis not present

## 2017-05-14 DIAGNOSIS — F411 Generalized anxiety disorder: Secondary | ICD-10-CM | POA: Diagnosis not present

## 2017-05-21 DIAGNOSIS — F411 Generalized anxiety disorder: Secondary | ICD-10-CM | POA: Diagnosis not present

## 2017-05-21 DIAGNOSIS — F3340 Major depressive disorder, recurrent, in remission, unspecified: Secondary | ICD-10-CM | POA: Diagnosis not present

## 2017-05-27 DIAGNOSIS — K219 Gastro-esophageal reflux disease without esophagitis: Secondary | ICD-10-CM | POA: Diagnosis not present

## 2017-05-27 DIAGNOSIS — F411 Generalized anxiety disorder: Secondary | ICD-10-CM | POA: Diagnosis not present

## 2017-05-27 DIAGNOSIS — M545 Low back pain: Secondary | ICD-10-CM | POA: Diagnosis not present

## 2017-05-27 DIAGNOSIS — Z79899 Other long term (current) drug therapy: Secondary | ICD-10-CM | POA: Diagnosis not present

## 2017-05-27 DIAGNOSIS — M150 Primary generalized (osteo)arthritis: Secondary | ICD-10-CM | POA: Diagnosis not present

## 2017-05-29 DIAGNOSIS — F3340 Major depressive disorder, recurrent, in remission, unspecified: Secondary | ICD-10-CM | POA: Diagnosis not present

## 2017-05-29 DIAGNOSIS — F411 Generalized anxiety disorder: Secondary | ICD-10-CM | POA: Diagnosis not present

## 2017-06-04 DIAGNOSIS — M76821 Posterior tibial tendinitis, right leg: Secondary | ICD-10-CM | POA: Diagnosis not present

## 2017-06-05 DIAGNOSIS — F411 Generalized anxiety disorder: Secondary | ICD-10-CM | POA: Diagnosis not present

## 2017-06-05 DIAGNOSIS — F3340 Major depressive disorder, recurrent, in remission, unspecified: Secondary | ICD-10-CM | POA: Diagnosis not present

## 2017-06-16 DIAGNOSIS — F411 Generalized anxiety disorder: Secondary | ICD-10-CM | POA: Diagnosis not present

## 2017-06-16 DIAGNOSIS — F3340 Major depressive disorder, recurrent, in remission, unspecified: Secondary | ICD-10-CM | POA: Diagnosis not present

## 2017-06-26 DIAGNOSIS — F411 Generalized anxiety disorder: Secondary | ICD-10-CM | POA: Diagnosis not present

## 2017-06-26 DIAGNOSIS — F3340 Major depressive disorder, recurrent, in remission, unspecified: Secondary | ICD-10-CM | POA: Diagnosis not present

## 2017-07-02 DIAGNOSIS — J9811 Atelectasis: Secondary | ICD-10-CM | POA: Diagnosis not present

## 2017-07-02 DIAGNOSIS — T424X1A Poisoning by benzodiazepines, accidental (unintentional), initial encounter: Secondary | ICD-10-CM | POA: Diagnosis not present

## 2017-07-02 DIAGNOSIS — J9690 Respiratory failure, unspecified, unspecified whether with hypoxia or hypercapnia: Secondary | ICD-10-CM | POA: Diagnosis not present

## 2017-07-02 DIAGNOSIS — F332 Major depressive disorder, recurrent severe without psychotic features: Secondary | ICD-10-CM | POA: Diagnosis not present

## 2017-07-02 DIAGNOSIS — F419 Anxiety disorder, unspecified: Secondary | ICD-10-CM | POA: Diagnosis not present

## 2017-07-02 DIAGNOSIS — J9601 Acute respiratory failure with hypoxia: Secondary | ICD-10-CM | POA: Diagnosis not present

## 2017-07-02 DIAGNOSIS — R0602 Shortness of breath: Secondary | ICD-10-CM | POA: Diagnosis not present

## 2017-07-02 DIAGNOSIS — T1491XA Suicide attempt, initial encounter: Secondary | ICD-10-CM | POA: Diagnosis not present

## 2017-07-02 DIAGNOSIS — Z915 Personal history of self-harm: Secondary | ICD-10-CM | POA: Diagnosis not present

## 2017-07-02 DIAGNOSIS — T424X2A Poisoning by benzodiazepines, intentional self-harm, initial encounter: Secondary | ICD-10-CM | POA: Diagnosis not present

## 2017-07-02 DIAGNOSIS — R4587 Impulsiveness: Secondary | ICD-10-CM | POA: Diagnosis not present

## 2017-07-02 DIAGNOSIS — G92 Toxic encephalopathy: Secondary | ICD-10-CM | POA: Diagnosis not present

## 2017-07-02 DIAGNOSIS — R918 Other nonspecific abnormal finding of lung field: Secondary | ICD-10-CM | POA: Diagnosis not present

## 2017-07-02 DIAGNOSIS — Q0700 Arnold-Chiari syndrome without spina bifida or hydrocephalus: Secondary | ICD-10-CM | POA: Diagnosis not present

## 2017-07-02 DIAGNOSIS — T424X4A Poisoning by benzodiazepines, undetermined, initial encounter: Secondary | ICD-10-CM | POA: Diagnosis not present

## 2017-07-02 DIAGNOSIS — R450 Nervousness: Secondary | ICD-10-CM | POA: Diagnosis not present

## 2017-07-02 DIAGNOSIS — F329 Major depressive disorder, single episode, unspecified: Secondary | ICD-10-CM | POA: Diagnosis not present

## 2017-07-02 DIAGNOSIS — G4700 Insomnia, unspecified: Secondary | ICD-10-CM | POA: Diagnosis not present

## 2017-07-02 DIAGNOSIS — Z452 Encounter for adjustment and management of vascular access device: Secondary | ICD-10-CM | POA: Diagnosis not present

## 2017-07-02 DIAGNOSIS — J690 Pneumonitis due to inhalation of food and vomit: Secondary | ICD-10-CM | POA: Diagnosis not present

## 2017-07-02 DIAGNOSIS — I10 Essential (primary) hypertension: Secondary | ICD-10-CM | POA: Diagnosis not present

## 2017-07-02 DIAGNOSIS — Z79899 Other long term (current) drug therapy: Secondary | ICD-10-CM | POA: Diagnosis not present

## 2017-07-02 DIAGNOSIS — F1910 Other psychoactive substance abuse, uncomplicated: Secondary | ICD-10-CM | POA: Diagnosis not present

## 2017-07-03 DIAGNOSIS — Z452 Encounter for adjustment and management of vascular access device: Secondary | ICD-10-CM | POA: Diagnosis not present

## 2017-07-03 DIAGNOSIS — T424X1A Poisoning by benzodiazepines, accidental (unintentional), initial encounter: Secondary | ICD-10-CM | POA: Diagnosis not present

## 2017-07-03 DIAGNOSIS — J9690 Respiratory failure, unspecified, unspecified whether with hypoxia or hypercapnia: Secondary | ICD-10-CM | POA: Diagnosis not present

## 2017-07-03 DIAGNOSIS — J9811 Atelectasis: Secondary | ICD-10-CM | POA: Diagnosis not present

## 2017-07-04 DIAGNOSIS — J9811 Atelectasis: Secondary | ICD-10-CM | POA: Diagnosis not present

## 2017-07-04 DIAGNOSIS — J9690 Respiratory failure, unspecified, unspecified whether with hypoxia or hypercapnia: Secondary | ICD-10-CM | POA: Diagnosis not present

## 2017-07-04 DIAGNOSIS — T424X1A Poisoning by benzodiazepines, accidental (unintentional), initial encounter: Secondary | ICD-10-CM | POA: Diagnosis not present

## 2017-07-05 DIAGNOSIS — T424X1A Poisoning by benzodiazepines, accidental (unintentional), initial encounter: Secondary | ICD-10-CM | POA: Diagnosis not present

## 2017-07-05 DIAGNOSIS — J9690 Respiratory failure, unspecified, unspecified whether with hypoxia or hypercapnia: Secondary | ICD-10-CM | POA: Diagnosis not present

## 2017-07-06 ENCOUNTER — Other Ambulatory Visit: Payer: Self-pay

## 2017-07-06 ENCOUNTER — Encounter (HOSPITAL_COMMUNITY): Payer: Self-pay

## 2017-07-06 ENCOUNTER — Inpatient Hospital Stay (HOSPITAL_COMMUNITY)
Admission: AD | Admit: 2017-07-06 | Discharge: 2017-07-11 | DRG: 885 | Disposition: A | Discharge status: Home / Self Care | Payer: BLUE CROSS/BLUE SHIELD | Source: Other Acute Inpatient Hospital | Attending: Psychiatry | Admitting: Psychiatry

## 2017-07-06 DIAGNOSIS — K52832 Lymphocytic colitis: Secondary | ICD-10-CM | POA: Diagnosis present

## 2017-07-06 DIAGNOSIS — R45851 Suicidal ideations: Secondary | ICD-10-CM | POA: Diagnosis not present

## 2017-07-06 DIAGNOSIS — G4700 Insomnia, unspecified: Secondary | ICD-10-CM | POA: Diagnosis not present

## 2017-07-06 DIAGNOSIS — J9690 Respiratory failure, unspecified, unspecified whether with hypoxia or hypercapnia: Secondary | ICD-10-CM | POA: Diagnosis not present

## 2017-07-06 DIAGNOSIS — R450 Nervousness: Secondary | ICD-10-CM | POA: Diagnosis not present

## 2017-07-06 DIAGNOSIS — R0602 Shortness of breath: Secondary | ICD-10-CM | POA: Diagnosis not present

## 2017-07-06 DIAGNOSIS — R51 Headache: Secondary | ICD-10-CM | POA: Diagnosis present

## 2017-07-06 DIAGNOSIS — M25579 Pain in unspecified ankle and joints of unspecified foot: Secondary | ICD-10-CM | POA: Diagnosis present

## 2017-07-06 DIAGNOSIS — T424X1A Poisoning by benzodiazepines, accidental (unintentional), initial encounter: Secondary | ICD-10-CM | POA: Diagnosis not present

## 2017-07-06 DIAGNOSIS — Z6839 Body mass index (BMI) 39.0-39.9, adult: Secondary | ICD-10-CM

## 2017-07-06 DIAGNOSIS — Z881 Allergy status to other antibiotic agents status: Secondary | ICD-10-CM | POA: Diagnosis not present

## 2017-07-06 DIAGNOSIS — I10 Essential (primary) hypertension: Secondary | ICD-10-CM | POA: Diagnosis present

## 2017-07-06 DIAGNOSIS — E669 Obesity, unspecified: Secondary | ICD-10-CM | POA: Diagnosis not present

## 2017-07-06 DIAGNOSIS — Z915 Personal history of self-harm: Secondary | ICD-10-CM

## 2017-07-06 DIAGNOSIS — K219 Gastro-esophageal reflux disease without esophagitis: Secondary | ICD-10-CM | POA: Diagnosis present

## 2017-07-06 DIAGNOSIS — Z888 Allergy status to other drugs, medicaments and biological substances status: Secondary | ICD-10-CM | POA: Diagnosis not present

## 2017-07-06 DIAGNOSIS — F329 Major depressive disorder, single episode, unspecified: Secondary | ICD-10-CM | POA: Diagnosis present

## 2017-07-06 DIAGNOSIS — F1910 Other psychoactive substance abuse, uncomplicated: Secondary | ICD-10-CM | POA: Diagnosis not present

## 2017-07-06 DIAGNOSIS — T1491XA Suicide attempt, initial encounter: Secondary | ICD-10-CM | POA: Diagnosis not present

## 2017-07-06 DIAGNOSIS — R451 Restlessness and agitation: Secondary | ICD-10-CM | POA: Diagnosis not present

## 2017-07-06 DIAGNOSIS — F419 Anxiety disorder, unspecified: Secondary | ICD-10-CM | POA: Diagnosis not present

## 2017-07-06 DIAGNOSIS — F332 Major depressive disorder, recurrent severe without psychotic features: Principal | ICD-10-CM | POA: Diagnosis present

## 2017-07-06 DIAGNOSIS — R4587 Impulsiveness: Secondary | ICD-10-CM | POA: Diagnosis not present

## 2017-07-06 DIAGNOSIS — T424X2A Poisoning by benzodiazepines, intentional self-harm, initial encounter: Secondary | ICD-10-CM | POA: Diagnosis not present

## 2017-07-06 DIAGNOSIS — F39 Unspecified mood [affective] disorder: Secondary | ICD-10-CM | POA: Diagnosis not present

## 2017-07-06 MED ORDER — BUDESONIDE 3 MG PO CPEP
9.0000 mg | ORAL_CAPSULE | Freq: Every morning | ORAL | Status: DC
  Filled 2017-07-06: qty 3

## 2017-07-06 MED ORDER — TRAZODONE HCL 50 MG PO TABS
50.0000 mg | ORAL_TABLET | Freq: Every evening | ORAL | Status: DC | PRN
  Administered 2017-07-06: 50 mg via ORAL
  Filled 2017-07-06: qty 1

## 2017-07-06 MED ORDER — ALUM & MAG HYDROXIDE-SIMETH 200-200-20 MG/5ML PO SUSP
30.0000 mL | ORAL | Status: DC | PRN
Start: 2017-07-06 — End: 2017-07-11
  Administered 2017-07-06 – 2017-07-11 (×13): 30 mL via ORAL
  Filled 2017-07-06 (×13): qty 30

## 2017-07-06 MED ORDER — MAGNESIUM HYDROXIDE 400 MG/5ML PO SUSP: 30.0000 mL | Freq: Every day | ORAL | Status: DC | PRN

## 2017-07-06 MED ORDER — PHENOL 1.4 % MT LIQD
1.0000 | OROMUCOSAL | Status: DC | PRN
  Administered 2017-07-06 – 2017-07-07 (×3): 1 via OROMUCOSAL
  Filled 2017-07-06 (×2): qty 177

## 2017-07-06 MED ORDER — ACETAMINOPHEN 325 MG PO TABS
650.0000 mg | ORAL_TABLET | Freq: Four times a day (QID) | ORAL | Status: DC | PRN
  Administered 2017-07-08 – 2017-07-09 (×4): 650 mg via ORAL
  Filled 2017-07-06 (×5): qty 2

## 2017-07-06 MED ORDER — HYDROXYZINE HCL 25 MG PO TABS
25.0000 mg | ORAL_TABLET | Freq: Four times a day (QID) | ORAL | Status: DC | PRN
  Administered 2017-07-06: 25 mg via ORAL
  Filled 2017-07-06: qty 1

## 2017-07-06 NOTE — Tx Team (Signed)
Initial Treatment Plan 07/06/2017 9:04 PM Kayla ServeLaurel A Holden QIO:[id number]    PATIENT STRESSORS: Marital or family conflict Other: "medication maintenance"   PATIENT STRENGTHS: Ability for insight Average or above average intelligence Communication skills General fund of knowledge   PATIENT IDENTIFIED PROBLEMS: Depression Suicidal thoughts "Medications not working right"                     DISCHARGE CRITERIA:  Ability to meet basic life and health needs Improved stabilization in mood, thinking, and/or behavior Reduction of life-threatening or endangering symptoms to within safe limits Verbal commitment to aftercare and medication compliance  PRELIMINARY DISCHARGE PLAN: Attend aftercare/continuing care group Return to previous living arrangement  PATIENT/FAMILY INVOLVEMENT: This treatment plan has been presented to and reviewed with the patient, Kayla Holden, and/or family member, .  The patient and family have been given the opportunity to ask questions and make suggestions.  Ilyssa Grennan, [place], [place] 07/06/2017, 9:04 PM

## 2017-07-06 NOTE — Progress Notes (Signed)
Group Note:  The patient did not attend group this evening since she had just been admitted to the hallway and wanted to be alone.

## 2017-07-06 NOTE — Progress Notes (Signed)
Kayla Holden is a 36 year old female pt admitted on involuntary basis [facility]. [facility] before being transferred after overdosing on some of her old medications. She does report that she had been feeling suicidal prior to overdosing and reports that she feels her medications are not working correctly.  Kayla Holden presents as anxious and irritable but cooperative throughout admission process. She denies any SI on admission and is able to contract for safety while in the hospital. She reports that she lives alone and reports that she plans to go back to her same living situation upon discharge. Kayla Holden was oriented to the unit and safety maintained.

## 2017-07-06 NOTE — BH Assessment (Signed)
Assessment Note  Kayla Holden is a 36 y.o. female who presented to [facility] on 07/02/2017. She was assessed on 07/05/2017 by TTS Wolfgang Phoenix, LPC. Following is the narrative to that assessment:  Pt was prescribed Klonodin. Pt states she was taking Klonodin but she ran out of the medication and was looking for other medications to take. Per pt, she took "old Valium" from May 2016 and she began to feel bad so she contacted her father at his job. In the initial EDP note, it was stated that the pt took 90 Valium. Per pt's father and the pt, she does not remember how many Valium she took. Per EDP note, the pt was unresponsive when she arrived to the hospital. The pt's father transported her to the hospital. Pt was on Klonopin for suicidal ideations per pt. Pt had previous SI but no attempts. Pt reports having suicidal thoughts the night she did not have the Klonopin and took the Valium.  Pt denies SA.   Pt is seeing a therapist at the [facility] in [place], [place] 1x a week.   PCP has been prescribing the pt her mental health medication. Per Mr. Annette Stable Palmer-pt's father-he feels the PCP has been over-prescribing medication.   Mr. Rubye Oaks states that the pt will begin living with him and his wife, and they will assist the pt with her mental health.  Pt denies HI and AVH. Pt states she has been diagnosed with MDD.  Pt was hospitalized at [facility] in November 2017.   Diagnosis: MDD  Past Medical History:  Past Medical History:  Diagnosis Date  . Anxiety   . Chiari malformation   . Depression   . GERD (gastroesophageal reflux disease)   . Microscopic colitis   . Migraine headache     Past Surgical History:  Procedure Laterality Date  . APPENDECTOMY    . CHOLECYSTECTOMY    . KNEE SURGERY Left    3 times    Family History:  Family History  Problem Relation Age of Onset  . Cancer Maternal Grandmother   . Cancer Paternal Grandfather   . Allergic rhinitis Neg Hx   .  Angioedema Neg Hx   . Asthma Neg Hx   . Atopy Neg Hx   . Eczema Neg Hx   . Urticaria Neg Hx   . Immunodeficiency Neg Hx     Social History:  reports that  has never smoked. she has never used smokeless tobacco. She reports that she does not drink alcohol or use drugs.  Additional Social History:  Alcohol / Drug Use Pain Medications: see PTA meds Prescriptions: see PTA meds Over the Counter: see PTA meds History of alcohol / drug use?: No history of alcohol / drug abuse  CIWA:   COWS:    Allergies:  Allergies  Allergen Reactions  . Isoniazid Shortness Of Breath  . Rifampin Shortness Of Breath  . Butalbital-Aspirin-Caffeine   . Ketoprofen Other (See Comments)    Abdominal pain  . Nsaids Other (See Comments)    Patient with colitis   . Clindamycin/Lincomycin Other (See Comments)    Abdominal pain    Home Medications:  No medications prior to admission.    OB/GYN Status:  No LMP recorded. Patient has had an injection.  General Assessment Data Location of Assessment: [facility] TTS Assessment: Out of system Is this a Tele or Face-to-Face Assessment?: Tele Assessment Is this an Initial Assessment or a Re-assessment for this encounter?: Initial Assessment  Marital status: Single Is patient pregnant?: No Pregnancy Status: No Living Arrangements: Alone Can pt return to current living arrangement?: Yes Admission Status: Involuntary Is patient capable of signing voluntary admission?: No Referral Source: Self/Family/Friend Insurance type: BCBS     Crisis Care Plan Living Arrangements: Alone Name of Psychiatrist: none Name of Therapist: [facility]  Education Status Is patient currently in school?: No  Risk to self with the past 6 months Suicidal Ideation: No-Not Currently/Within Last 6 Months Has patient been a risk to self within the past 6 months prior to admission? : Yes Suicidal Intent: No Has patient had any suicidal intent within the  past 6 months prior to admission? : Yes Is patient at risk for suicide?: Yes Suicidal Plan?: Yes-Currently Present Has patient had any suicidal plan within the past 6 months prior to admission? : Yes Specify Current Suicidal Plan: pt OD'd Access to Means: Yes Specify Access to Suicidal Means: rx meds Previous Attempts/Gestures: No Intentional Self Injurious Behavior: None Family Suicide History: No Recent stressful life event(s): Other (Comment) Persecutory voices/beliefs?: No Depression: Yes Depression Symptoms: Despondent, Tearfulness, Isolating, Feeling worthless/self pity, Feeling angry/irritable Substance abuse history and/or treatment for substance abuse?: No Suicide prevention information given to non-admitted patients: Not applicable  Risk to Others within the past 6 months Homicidal Ideation: No Does patient have any lifetime risk of violence toward others beyond the six months prior to admission? : No Thoughts of Harm to Others: No Current Homicidal Intent: No Current Homicidal Plan: No Access to Homicidal Means: No History of harm to others?: No Assessment of Violence: None Noted Does patient have access to weapons?: No Criminal Charges Pending?: No Does patient have a court date: No Is patient on probation?: No  Psychosis Hallucinations: None noted Delusions: None noted  Mental Status Report Appearance/Hygiene: Unremarkable Eye Contact: Unable to Assess Motor Activity: Unremarkable Speech: Unable to assess Level of Consciousness: Unable to assess Mood: Euthymic Affect: Appropriate to circumstance Anxiety Level: Moderate Thought Processes: Unable to Assess Judgement: Impaired Orientation: Unable to assess Obsessive Compulsive Thoughts/Behaviors: Unable to Assess  Cognitive Functioning Concentration: Unable to Assess Memory: Recent Intact, Remote Intact IQ: Average Insight: see judgement above Impulse Control: Unable to Assess Vegetative Symptoms:  Unable to Assess  ADLScreening [facility] Assessment Services) Patient's cognitive ability adequate to safely complete daily activities?: Yes Patient able to express need for assistance with ADLs?: Yes Independently performs ADLs?: Yes (appropriate for developmental age)  Prior Inpatient Therapy Prior Inpatient Therapy: Yes Prior Therapy Dates: 06/2016 Prior Therapy Facilty/Provider(s): [facility] Reason for Treatment: depression  Prior Outpatient Therapy Prior Outpatient Therapy: No Does patient have an ACCT team?: No Does patient have Intensive In-House Services?  : No Does patient have [facility] services? : No Does patient have P4CC services?: No  ADL Screening (condition at time of admission) Patient's cognitive ability adequate to safely complete daily activities?: Yes Is the patient deaf or have difficulty hearing?: No Does the patient have difficulty seeing, even when wearing glasses/contacts?: No Does the patient have difficulty concentrating, remembering, or making decisions?: Yes Patient able to express need for assistance with ADLs?: Yes Does the patient have difficulty dressing or bathing?: No Independently performs ADLs?: Yes (appropriate for developmental age) Does the patient have difficulty walking or climbing stairs?: No Weakness of Legs: None Weakness of Arms/Hands: None  Home Assistive Devices/Equipment Home Assistive Devices/Equipment: None    Abuse/Neglect Assessment (Assessment to be complete while patient is alone) Abuse/Neglect Assessment Can Be Completed: Yes Physical Abuse:  Denies Verbal Abuse: Denies Sexual Abuse: Denies Exploitation of patient/patient's resources: Denies Self-Neglect: Denies Values / Beliefs Cultural Requests During Hospitalization: None Spiritual Requests During Hospitalization: None   Advance Directives (For Healthcare) Does Patient Have a Medical Advance Directive?: No Would patient like information on creating a medical  advance directive?: No - Patient declined    Additional Information 1:1 In Past 12 Months?: No CIRT Risk: No Elopement Risk: No Does patient have medical clearance?: Yes     Disposition:  Disposition Initial Assessment Completed for this Encounter: Yes(case staffed with Ferne ReusJustina Okonkwo, NP) Disposition of Patient: Inpatient treatment program Type of inpatient treatment program: Adult([facility]  On Site Evaluation by:   Reviewed with Physician:    Laddie AquasSamantha M Kira Hartl 07/06/2017 1:02 PM

## 2017-07-06 NOTE — Progress Notes (Signed)
Patient ID: Kayla Holden, female   DOB: [birth year], 36 y.o.   MRN: [id number]  [facility]. Pt given the opportunity to express concerns and ask questions. Pt wishes to take a shower, instructed to keep dressing dry. Pt also reports that she is currently on her period and has colitis, concerned about the smell of her pads. Given an extra trash bag for her bathroom. Pt currently denies SI/HI and A/V hallucinations. Pt verbally agrees to seek staff if SI/HI or A/VH occurs and to consult with staff before acting on these thoughts. Pt stable at this time, will continue to monitor.

## 2017-07-06 NOTE — Progress Notes (Signed)
Patient ID: [name], female   DOB: [birth year], 36 y.o.   MRN: [id number]  Pt took sleep medication. Reports that she was taking double the dose PTA. Pt given as needed medication, agrees she will speak to the MD in the am. Pt then reports to writer that she threw up. Reports that this has been happening when she coughs since her extubation. Pt then asks for Chloraseptic spray, Maalox and further sleep medication. Provider notified, see MAR. Patient head elevated in bed, currently stable. Will continue to monitor.

## 2017-07-06 NOTE — Progress Notes (Signed)
Patient ID: Kayla Holden, female   DOB: Aug 29, 1980, 36 y.o.   MRN: [id number]  Pt awakens, coughing. Dry heaves noted post cough. Pt requests to sleep in a chair tonight. Chloraseptic spray given, transferred to medical bed where head was raised further. Charge Nurse notified. Pt states "If you hear me cough, you will come check on me though right?" Ensured patient of safety and 15 minute checks, will continue to monitor.

## 2017-07-07 DIAGNOSIS — G4700 Insomnia, unspecified: Secondary | ICD-10-CM

## 2017-07-07 DIAGNOSIS — T1491XA Suicide attempt, initial encounter: Secondary | ICD-10-CM

## 2017-07-07 DIAGNOSIS — R450 Nervousness: Secondary | ICD-10-CM

## 2017-07-07 DIAGNOSIS — F419 Anxiety disorder, unspecified: Secondary | ICD-10-CM

## 2017-07-07 DIAGNOSIS — R4587 Impulsiveness: Secondary | ICD-10-CM

## 2017-07-07 DIAGNOSIS — T424X2A Poisoning by benzodiazepines, intentional self-harm, initial encounter: Secondary | ICD-10-CM

## 2017-07-07 DIAGNOSIS — F332 Major depressive disorder, recurrent severe without psychotic features: Principal | ICD-10-CM

## 2017-07-07 MED ORDER — GABAPENTIN 100 MG PO CAPS
100.0000 mg | ORAL_CAPSULE | Freq: Three times a day (TID) | ORAL | Status: DC
  Administered 2017-07-07 – 2017-07-10 (×9): 100 mg via ORAL
  Filled 2017-07-07 (×14): qty 1

## 2017-07-07 MED ORDER — PANTOPRAZOLE SODIUM 40 MG PO TBEC
40.0000 mg | DELAYED_RELEASE_TABLET | Freq: Every day | ORAL | Status: DC
  Administered 2017-07-07 – 2017-07-11 (×5): 40 mg via ORAL
  Filled 2017-07-07 (×8): qty 1

## 2017-07-07 MED ORDER — DESVENLAFAXINE SUCCINATE ER 50 MG PO TB24
50.0000 mg | ORAL_TABLET | Freq: Every day | ORAL | Status: DC
  Administered 2017-07-07 – 2017-07-11 (×5): 50 mg via ORAL
  Filled 2017-07-07 (×9): qty 1

## 2017-07-07 MED ORDER — PROPRANOLOL HCL 40 MG PO TABS: 40.0000 mg | ORAL_TABLET | Freq: Every day | ORAL | Status: DC

## 2017-07-07 MED ORDER — PROPRANOLOL HCL 10 MG PO TABS
10.0000 mg | ORAL_TABLET | Freq: Three times a day (TID) | ORAL | Status: DC
  Administered 2017-07-07 – 2017-07-11 (×12): 10 mg via ORAL
  Filled 2017-07-07 (×18): qty 1

## 2017-07-07 MED ORDER — BUDESONIDE 3 MG PO CPEP
9.0000 mg | ORAL_CAPSULE | ORAL | Status: DC
  Administered 2017-07-07 – 2017-07-11 (×5): 9 mg via ORAL
  Filled 2017-07-07 (×3): qty 3

## 2017-07-07 MED ORDER — TRAZODONE HCL 100 MG PO TABS
100.0000 mg | ORAL_TABLET | Freq: Every evening | ORAL | Status: DC | PRN
  Administered 2017-07-07 – 2017-07-10 (×4): 100 mg via ORAL
  Filled 2017-07-07 (×4): qty 1

## 2017-07-07 MED ORDER — HYDROXYZINE HCL 50 MG PO TABS
50.0000 mg | ORAL_TABLET | Freq: Four times a day (QID) | ORAL | Status: DC | PRN
  Administered 2017-07-07 – 2017-07-10 (×11): 50 mg via ORAL
  Filled 2017-07-07 (×11): qty 1

## 2017-07-07 NOTE — Progress Notes (Signed)
Adult Psychoeducational Group Note  Date:  07/07/2017 Time:  8:41 PM  Group Topic/Focus:  Wrap-Up Group:   The focus of this group is to help patients review their daily goal of treatment and discuss progress on daily workbooks.  Participation Level:  Active  Participation Quality:  Appropriate  Affect:  Appropriate  Cognitive:  Appropriate  Insight: Appropriate  Engagement in Group:  Engaged  Modes of Intervention:  Activity  Additional Comments:  Pt rated her day a 5. Goal is medication management.  Natasha MeadKiara M Jasminemarie Sherrard 07/07/2017, 8:41 PM

## 2017-07-07 NOTE — Progress Notes (Signed)
Dressing removed from the right side of the neck where picc line was done, not swelling on the sight, dry and intact.

## 2017-07-07 NOTE — Progress Notes (Signed)
Patient ID: Kayla Holden, female   DOB: Kayla Holden, 36 y.o.   MRN: [id number]   Pt currently presents with a anxious affect and behavior. Pt reports to Clinical research associatewriter that their goal is to "work on getting my medications right." Pt [facility] would be able to notarize something for her while she is admitted. She also requests to have her service [facility]. Pt reports poor sleep with current medication regimen, aware of increase in dose tonight.   Pt provided with medications per providers orders. Pt's labs and vitals were monitored throughout the night. Pt given a 1:1 about emotional and mental status. Pt supported and encouraged to express concerns and questions. Pt educated on medications and rules/regulations of the unit.   Pt's safety ensured with 15 minute and environmental checks. Pt currently denies SI/HI and A/V hallucinations. Pt verbally agrees to seek staff if SI/HI or A/VH occurs and to consult with staff before acting on any harmful thoughts. Pt is upset about her previous doctor that "put me on way to many medications." Will continue POC.

## 2017-07-07 NOTE — Progress Notes (Signed)
Recreation Therapy Notes  Date: 07/07/17 Time: 1000 Location: 500 Hall Dayroom  Group Topic: Coping Skills  Goal Area(s) Addresses:  Patient will be able to identify positive coping skills. Patient will be able to identify benefits of positive coping skills. Patient will be able to identify benefits of using coping skills post d/c.  Behavioral Response: Engaged  Intervention: Worksheet, scissors, glue sticks, magazines  Activity: PharmacologistCoping Skills.  Patients were given a worksheet that was broken down into 5 categories (diversions, social, cognitive, tension releaser and physical).  Patients were to identify picture from the magazines that represent at least 2 coping skills they would use for each category.   Education: PharmacologistCoping Skills, Building control surveyorDischarge Planning.   Education Outcome: Acknowledges understanding/In group clarification offered/Needs additional education.   Clinical Observations/Feedback: Pt gathered her supplies and was ready to work on her sheet.  Pt was called out of group to meet with social worker and did not return.   Caroll RancherMarjette Oliverio Cho, LRT/CTRS         Caroll RancherLindsay, Yovanni Frenette A 07/07/2017 12:37 PM

## 2017-07-07 NOTE — H&P (Signed)
Psychiatric Admission Assessment Adult  Patient Identification: Kayla Holden  MRN:  [id number]  Date of Evaluation:  07/07/2017  Chief Complaint: Benzodiazepine overdose.  Principal Diagnosis: Major depressive disorder, recurrent episodes, severe.  Diagnosis:   Patient Active Problem List   Diagnosis Date Noted  . MDD (major depressive disorder) [F32.9] 07/06/2017  . Lymphocytic colitis [K52.832] 08/22/2016  . Conversion disorder [F44.9] 06/20/2015  . Chronic daily headache [R51] 05/09/2015   History of Present Illness: This is an admission assessment for this 36 year old Caucasian female. Admitted to the [facility] adult unit from the [facility] with complaints of Benzodiazepine overdose that rendered patient unresponsive requiring intubation & central-line placement. She is currently being admitted for mood stabilization treatments.  During this assessment, Kayla Holden reports, "My dad took me to the [facility] last Thursday. I was on Clozapine for suicidal thoughts x 2 months. I recently ran out of the Clozapine for few days. I had a left over valium 10 mg, about 38 years old. I took about 10 tablets of them. It did not help my suicidal thoughts. Instead, it made me feel dizzy. It felt like the ceiling & everything in the room where spinning. I called my dad to take me to the hospital. I was diagnosed with Major depression & anxiety about a year ago. I was also screened for bipolar disorder & told it was negative. I have been going to the mood treatment center for my mental health treatment & counseling services. I was at the mood treatment center 2 weeks ago. But, my provider by the name of Annalee Genta left the center. I have no one to prescribe my medicines. That was the reason that I ran out of the Clozapine. The over dose incident was not a suicide attempt. I was trying to stop feeling suicidal after I ran out of the Clozapine. I have tried on a lot of medicines for my  depression, such as Cymbalta, Pristiq, Klonopin & lithium. The lithium was a disaster. It made me lethargic. The Cymbalta did not work at all. The Pristiq helped & I would like to try it again. My father does not want me on Klonopin because it is addictive. I do not have drug abuse problems. I used to smoke weed, but, last November, I smoked some weed that were laced with Methamphetamine. It made crazy & I was hospitalized at the old [place]. That was it. I have a lot of medical issues; Chiari malformation, gives me bad headaches which I have been treated with Dilaudid. I also have microscopic colitis which I took Zonisamide. I do get bad reflux issues as well".  Associated Signs/Symptoms:  Depression Symptoms:  depressed mood, insomnia, anxiety,  (Hypo) Manic Symptoms:  Impulsivity,  Anxiety Symptoms:  Excessive Worry,  Psychotic Symptoms:  Currently denies any thoughts, plans or intent.  PTSD Symptoms: Denies any PTSD symptoms or intent.  Total Time spent with patient: 1 hour  Past Psychiatric History: Major depression, suicidal ideations, Anxiety symptoms.  Is the patient at risk to self? No.  Has the patient been a risk to self in the past 6 months? Yes.    Has the patient been a risk to self within the distant past? Yes.    Is the patient a risk to others? No.  Has the patient been a risk to others in the past 6 months? No.  Has the patient been a risk to others within the distant past? No.   Prior Inpatient Therapy: Prior  Inpatient Therapy: Yes Prior Therapy Dates: 06/2016 Prior Therapy Facilty/Provider(s): [facility] Reason for Treatment: depression  Prior Outpatient Therapy: Prior Outpatient Therapy: No Does patient have an ACCT team?: No Does patient have Intensive In-House Services?  : No Does patient have [facility] services? : No Does patient have P4CC services?: No  Alcohol Screening: 1. How often do you have a drink containing alcohol?: Never 2. How many drinks  containing alcohol do you have on a typical day when you are drinking?: 1 or 2 3. How often do you have six or more drinks on one occasion?: Never AUDIT-C Score: 0 4. How often during the last year have you found that you were not able to stop drinking once you had started?: Never 5. How often during the last year have you failed to do what was normally expected from you becasue of drinking?: Never 6. How often during the last year have you needed a first drink in the morning to get yourself going after a heavy drinking session?: Never 7. How often during the last year have you had a feeling of guilt of remorse after drinking?: Never 8. How often during the last year have you been unable to remember what happened the night before because you had been drinking?: Never 9. Have you or someone else been injured as a result of your drinking?: No 10. Has a relative or friend or a doctor or another health worker been concerned about your drinking or suggested you cut down?: No Alcohol Use Disorder Identification Test Final Score (AUDIT): 0 Intervention/Follow-up: AUDIT Score <7 follow-up not indicated  Substance Abuse History in the last 12 months:  Yes.    Consequences of Substance Abuse: Medical Consequences:  Liver damage, Possible death by overdose Legal Consequences:  Arrests, jail time, Loss of driving privilege. Family Consequences:  Family discord, divorce and or separation.  Previous Psychotropic Medications: Yes, (Abilify, Clozaril, Klonopin, Valium, Pristiq, Cymbalta, Restoril, Vistaril).  Psychological Evaluations: No   Past Medical History:  Past Medical History:  Diagnosis Date  . Anxiety   . Chiari malformation   . Depression   . GERD (gastroesophageal reflux disease)   . Microscopic colitis   . Migraine headache     Past Surgical History:  Procedure Laterality Date  . APPENDECTOMY    . CHOLECYSTECTOMY    . KNEE SURGERY Left    3 times   Family History:  Family  History  Problem Relation Age of Onset  . Cancer Maternal Grandmother   . Cancer Paternal Grandfather   . Allergic rhinitis Neg Hx   . Angioedema Neg Hx   . Asthma Neg Hx   . Atopy Neg Hx   . Eczema Neg Hx   . Urticaria Neg Hx   . Immunodeficiency Neg Hx    Family Psychiatric  History: Denies any hx of mental illness in her family.  Tobacco Screening: Have you used any form of tobacco in the last 30 days? (Cigarettes, Smokeless Tobacco, Cigars, and/or Pipes): No  Social History:  Social History   Substance and Sexual Activity  Alcohol Use No     Social History   Substance and Sexual Activity  Drug Use No    Additional Social History: Marital status: Single    Pain Medications: see PTA meds Prescriptions: see PTA meds Over the Counter: see PTA meds History of alcohol / drug use?: No history of alcohol / drug abuse  Allergies:   Allergies  Allergen Reactions  . Isoniazid Shortness Of  Breath  . Rifampin Shortness Of Breath  . Butalbital-Aspirin-Caffeine   . Ketoprofen Other (See Comments)    Abdominal pain  . Nsaids Other (See Comments)    Patient with colitis   . Clindamycin/Lincomycin Other (See Comments)    Abdominal pain   Lab Results: No results found for this or any previous visit (from the past 48 hour(s)).  Blood Alcohol level:  No results found for: [facility]  Metabolic Disorder Labs:  No results found for: HGBA1C, MPG No results found for: PROLACTIN No results found for: CHOL, TRIG, HDL, CHOLHDL, VLDL, LDLCALC  Current Medications: Current Facility-Administered Medications  Medication Dose Route Frequency Provider Last Rate Last Dose  . acetaminophen (TYLENOL) tablet 650 mg  650 mg Oral Q6H PRN Niel Hummer, NP      . alum & mag hydroxide-simeth (MAALOX/MYLANTA) 200-200-20 MG/5ML suspension 30 mL  30 mL Oral Q4H PRN Elmarie Shiley A, NP   30 mL at 07/07/17 0818  . budesonide (ENTOCORT EC) 24 hr capsule 9 mg  9 mg Oral BH-q7a Simon, Spencer E, PA-C   9  mg at 07/07/17 0601  . hydrOXYzine (ATARAX/VISTARIL) tablet 25 mg  25 mg Oral Q6H PRN Elmarie Shiley A, NP   25 mg at 07/06/17 2058  . magnesium hydroxide (MILK OF MAGNESIA) suspension 30 mL  30 mL Oral Daily PRN Elmarie Shiley A, NP      . phenol (CHLORASEPTIC) mouth spray 1 spray  1 spray Mouth/Throat PRN Laverle Hobby, PA-C   1 spray at 07/07/17 0820  . traZODone (DESYREL) tablet 50 mg  50 mg Oral QHS PRN Niel Hummer, NP   50 mg at 07/06/17 2058   PTA Medications: Medications Prior to Admission  Medication Sig Dispense Refill Last Dose  . ARIPiprazole (ABILIFY) 5 MG tablet Take 5 mg by mouth daily.  0 Taking  . B-D 3CC LUER-LOK SYR 25GX1" 25G X 1" 3 ML MISC USE AS DIRECTED WITH B12  5 Taking  . budesonide (ENTOCORT EC) 3 MG 24 hr capsule Take 9 mg by mouth every morning.  0 Taking  . clonazePAM (KLONOPIN) 2 MG tablet Take 2 mg by mouth 2 (two) times daily.  2 Taking  . cyanocobalamin (,VITAMIN B-12,) 1000 MCG/ML injection INJECT 1ML WEEKLY  5 Taking  . dicyclomine (BENTYL) 10 MG capsule TAKE 2 TABLET 4 TIMES A DAY FOR 30 DAYS ORALLY  2 Taking  . DULoxetine (CYMBALTA) 60 MG capsule Take 120 mg by mouth daily.  1 Taking  . HYDROmorphone (DILAUDID) 4 MG tablet TAKE ONE TABLET BY MOUTH EVERY SIX TO EIGHT HOURS AS NEEDED FOR SEVERE PAIN ONLY  0 Taking  . hydrOXYzine (VISTARIL) 25 MG capsule TAKE ONE CAPSULE BY MOUTH EVERY 6 HOURS AS NEEDED  2 Taking  . ondansetron (ZOFRAN-ODT) 8 MG disintegrating tablet 1 TABLET ON THE TONGUE AND ALLOW TO DISSOLVE EVERY 8 HRS ORALLY 30 DAYS  2 Taking  . PREVALITE 4 g packet TAKE 1 PACKET BY MOUTH TWICE DAILY BEFORE MEALS  5 Taking  . propranolol (INDERAL) 40 MG tablet TAKE 1 AND 1/2 TABLETS BY MOUTH 2 TIMES A DAY  2 Taking  . ranitidine (ZANTAC) 150 MG tablet Take 150 mg by mouth 2 (two) times daily.  1 Taking  . temazepam (RESTORIL) 30 MG capsule Take 30 mg by mouth at bedtime.  2 Taking  . zonisamide (ZONEGRAN) 100 MG capsule TAKE 2 CAPSULES BY ORAL ROUTE  DAILY FOR 30 DAYS  3 Taking  .  zonisamide (ZONEGRAN) 25 MG capsule TAKE 2 CAPSULES BY ORAL ROUTE ONCE A DAY (AT BEDTIME) FOR 30 DAYS  3 Taking   Musculoskeletal: Strength & Muscle Tone: within normal limits Gait & Station: normal Patient leans: N/A  Psychiatric Specialty Exam: Physical Exam  Constitutional: She appears well-developed.  HENT:  Head: Normocephalic.  Eyes: Pupils are equal, round, and reactive to light.  Neck: Normal range of motion.    Dressing to the right side of neck from s/p central-line placement.  Cardiovascular:  Elevated pulse rate of 133  Respiratory: Effort normal.  GI: Soft.  Genitourinary:  Genitourinary Comments: Deferred  Musculoskeletal: Normal range of motion.  Neurological: She is alert.  Skin: Skin is warm.    Review of Systems  Constitutional: Negative.   HENT: Negative.   Eyes: Negative.   Respiratory: Negative.   Cardiovascular: Negative.   Gastrointestinal: Negative.   Genitourinary: Negative.   Musculoskeletal: Negative.   Skin: Negative.   Neurological: Negative.   Endo/Heme/Allergies: Negative.   Psychiatric/Behavioral: Positive for depression and suicidal ideas. Negative for memory loss. The patient is nervous/anxious and has insomnia.     Blood pressure 126/85, pulse (!) 133, temperature 98.3 F (36.8 C), temperature source Oral, resp. rate 20, height 5' 3"  (1.6 m), weight 101.2 kg (223 lb).Body mass index is 39.5 kg/m.  General Appearance: Obese, in a hospital scrub.  Eye Contact:  Good  Speech:  Clear and Coherent and Normal Rate  Volume:  Normal  Mood:  Anxious, depressed, rates depression $6 & anxiety #8.  Affect:  Flat  Thought Process:  Coherent and Descriptions of Associations: Intact  Orientation:  Full (Time, Place, and Person)  Thought Content:  Rumination, denies any hallucinations.  Suicidal Thoughts: Currently denies any hallucinations, delusional thoughts or paranoia.  Homicidal Thoughts:  Denies any  thoughts, plans or intent  Memory:  Immediate;   Good Recent;   Good Remote;   Good  Judgement:  Fair  Insight:  Fair  Psychomotor Activity:  Normal  Concentration:  Concentration: Good and Attention Span: Fair  Recall:  Good  Fund of Knowledge:  Good  Language:  Good  Akathisia:  Negative  Handed:  Right  AIMS (if indicated):     Assets:  Communication Skills Desire for Improvement Social Support  ADL's:  Intact  Cognition:  WNL  Sleep:  Number of Hours: 4.5   Treatment Plan/Recommendations: 1. Admit for crisis management and stabilization, estimated length of stay 3-5 days.   2. Medication management to reduce current symptoms to base line and improve the patient's overall level of functioning: Will initiate Pristiq 50 mg daily for depression, Gabapentin 100 mg tid for agitation/anxiety. Will continue the Trazodone 50 mg prn & Hydroxyzine 50 mg Q 6 hours prn for anxiety.  3. Treat health problems as indicated.  4. Develop treatment plan to decrease risk of relapse upon discharge and the need for readmission.  5. Psycho-social education regarding relapse prevention and self care.  6. Health care follow up as needed for medical problems.  7. Review, reconcile, and reinstate any pertinent home medications for other health issues where appropriate. 8. Call for consults with hospitalist for any additional specialty patient care services as needed.  Observation Level/Precautions:  15 minute checks  Laboratory:  Per ED  Psychotherapy: group sessions  Medications: See [facility]  Consultations: As needed    Discharge Concerns: Safety, mood stability   Estimated LOS: 5-7 days  Other: Admit to the [facility]Hall.   Physician Treatment Plan  for Primary Diagnosis: Will initiate medication management for mood stability. Set up an outpatient psychiatric services for medication management. Will encourage medication adherence with psychiatric medications.  Long Term Goal(s): Improvement in symptoms  so as ready for discharge  Short Term Goals: Ability to identify changes in lifestyle to reduce recurrence of condition will improve  Physician Treatment Plan for Secondary Diagnosis: Active Problems:   MDD (major depressive disorder)  Long Term Goal(s): Improvement in symptoms so as ready for discharge  Short Term Goals: Ability to identify and develop effective coping behaviors will improve and Compliance with prescribed medications will improve  I certify that inpatient services furnished can reasonably be expected to improve the patient's condition.    Encarnacion Slates, NP, PMHNP, FNP-BC 11/20/201810:13 AM   I have reviewed NP's Note, assessement, diagnosis and plan, and agree. I have also met with patient and completed suicide risk assessment.  Sira Adsit is a 36 y/o F with history of MDD who was admitted with worsening depression and overdose of diazepam. Pt reports that she is prescribed clozapine as an outpatient for chronic suicidal thoughts, but she ran out about 3 days ago. To address her increasing anxiety, pt reports that she took about "10 to 12" tablets of valium (unknown dose) that she had been previously prescribed. Pt began feeling dizzy and she then called her father who contacted emergency services. Pt required intubation in the ED. She was medically stabilized and transferred to [facility]. Today upon evaluation, she denies that overdose was a suicide attempt. She endorses depression symptoms of poor mood, guilty feelings, low energy, low motivation, and poor concentration. She denies symptoms of mania, OCD, and PTSD. She denies all illicit substance use. Discussed with patient about treatment options and she was in agreement to be restarted on desvenlafaxine as she previously had tolerated that well and had some improvement of her mood with using it. Pt requested for medication to address anxiety, and we discussed attempting trial of gabapentin. Pt also reports some struggles with  initial insomnia and we agreed to increase dose of trazodone as pt has previously taken up to 155m qhs.  PLAN OF CARE:  - Admit to inpatient level of care  - MDD             - Start Pristiq 554mqDay - Anxiety             - Start gabapentin 1001mID                         - Start atarax 19m19mh prn anxiety             - Continue propranolol 10mg41m - Insomnia             - Change trazodone 50mg 26mto trazodone 100mg q57m- Encourage participation in groups and the therapeutic milieu - Discharge planning will be ongoing     ChristoMaris Berger

## 2017-07-07 NOTE — Progress Notes (Signed)
Recreation Therapy Notes  INPATIENT RECREATION THERAPY ASSESSMENT  Patient Details Name: [name] A Gatz MRN: [id number] DOB: 23-Jul-1981 Today's Date: 07/07/2017  Patient Stressors: Other (Comment)(Medication maintenance; lack of finances)  Pt stated she was here because she was out of her medication so she took a different medication and had a reaction.  Coping Skills:   Isolate, Avoidance, Exercise, Talking, Other (Comment)(Aromatherapy)  Personal Challenges: Self-Esteem/Confidence, Stress Management  Leisure Interests (2+):  Individual - Other (Comment)(Spend time with dog; do nails)  Awareness of Community Resources:  Yes  Community Resources:  Park, Public affairs consultantestaurants, Other (Comment)(Antiquet shops)  Current Use: Yes  Patient Strengths:  Good listener; good comforter  Patient Identified Areas of Improvement:  Weight management; Income  Current Recreation Participation:  3 times a week  Patient Goal for Hospitalization:  "Get out"  [place]  [place]  Current [place] (including self-harm):  No  Current HI:  No  Consent to Intern Participation: N/A    Caroll RancherMarjette Rue Valladares, LRT/CTRS  Caroll RancherLindsay, Jayshun Galentine A 07/07/2017, 2:48 PM

## 2017-07-07 NOTE — [facility] Group Notes (Signed)
LCSW Group Therapy Note   07/07/2017 1:15pm   Type of Therapy and Topic:  Group Therapy:  Overcoming Obstacles   Participation Level:  Did Not Attend   Description of Group:    In this group patients will be encouraged to explore what they see as obstacles to their own wellness and recovery. They will be guided to discuss their thoughts, feelings, and behaviors related to these obstacles. The group will process together ways to cope with barriers, with attention given to specific choices patients can make. Each patient will be challenged to identify changes they are motivated to make in order to overcome their obstacles. This group will be process-oriented, with patients participating in exploration of their own experiences as well as giving and receiving support and challenge from other group members.   Therapeutic Goals: 1. Patient will identify personal and current obstacles as they relate to admission. 2. Patient will identify barriers that currently interfere with their wellness or overcoming obstacles.  3. Patient will identify feelings, thought process and behaviors related to these barriers. 4. Patient will identify two changes they are willing to make to overcome these obstacles:      Summary of Patient Progress   Was meeting with provider during group.   Therapeutic Modalities:   Cognitive Behavioral Therapy Solution Focused Therapy Motivational Interviewing Relapse Prevention Therapy  Ida RogueRodney B Octavie Westerhold, LCSW 07/07/2017 12:01 PM
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
## 2017-07-07 NOTE — Progress Notes (Signed)
DAR NOTE: Pt present with bright affect and jovial mood in the unit. Pt has been visible in the milieu interacting with peers. Pt denies physical pain, took all his meds as scheduled. As per self inventory, pt had a good night sleep, good appetite, normal energy, and good concentration. Pt rate depression at 6, hopeless ness at 5, and anxiety 8. Pt's safety ensured with 15 minute and environmental checks. Pt currently denies SI/HI and A/V hallucinations. Pt verbally agrees to seek staff if SI/HI or A/VH occurs and to consult with staff before acting on these thoughts. Will continue POC.
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
## 2017-07-07 NOTE — Progress Notes (Signed)
Patient ID: Kayla Holden, female   DOB: Apr 20, 1981, 36 y.o.   MRN: [id number]  Pt asks writer how she would be able to see a "psychiatrist every day" as "nurse practitioners and PA's are unable to handle" her medical needs. Pt reports being diagnosed with CAD and a mass on her heart before arrival. [facility] from [facility] reviewed to find hx of HTN and "irregular heart beats." Lab imaging shows "normal heart size." Encouraged to share with provided. Will report this to AM RN as she can inform provider.
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
## 2017-07-08 DIAGNOSIS — R451 Restlessness and agitation: Secondary | ICD-10-CM

## 2017-07-08 DIAGNOSIS — F1910 Other psychoactive substance abuse, uncomplicated: Secondary | ICD-10-CM

## 2017-07-08 NOTE — Tx Team (Signed)
Interdisciplinary Treatment and Diagnostic Plan Update  07/08/2017 Time of Session: 12:07 PM  [name] MRN: [id number]  Principal Diagnosis: MDD (major depressive disorder)  Secondary Diagnoses: Principal Problem:   MDD (major depressive disorder)   Current Medications:  Current Facility-Administered Medications  Medication Dose Route Frequency Provider Last Rate Last Dose  . acetaminophen (TYLENOL) tablet 650 mg  650 mg Oral Q6H PRN Elmarie Shiley A, NP   650 mg at 07/08/17 1150  . alum & mag hydroxide-simeth (MAALOX/MYLANTA) 200-200-20 MG/5ML suspension 30 mL  30 mL Oral Q4H PRN Elmarie Shiley A, NP   30 mL at 07/08/17 0808  . budesonide (ENTOCORT EC) 24 hr capsule 9 mg  9 mg Oral BH-q7a Simon, Spencer E, PA-C   9 mg at 07/08/17 3614  . desvenlafaxine (PRISTIQ) 24 hr tablet 50 mg  50 mg Oral Daily Lindell Spar I, NP   50 mg at 07/08/17 0807  . gabapentin (NEURONTIN) capsule 100 mg  100 mg Oral TID Lindell Spar I, NP   100 mg at 07/08/17 1150  . hydrOXYzine (ATARAX/VISTARIL) tablet 50 mg  50 mg Oral Q6H PRN Pennelope Bracken, MD   50 mg at 07/08/17 4315  . magnesium hydroxide (MILK OF MAGNESIA) suspension 30 mL  30 mL Oral Daily PRN Elmarie Shiley A, NP      . pantoprazole (PROTONIX) EC tablet 40 mg  40 mg Oral Daily Lindell Spar I, NP   40 mg at 07/08/17 0807  . phenol (CHLORASEPTIC) mouth spray 1 spray  1 spray Mouth/Throat PRN Laverle Hobby, PA-C   1 spray at 07/07/17 1340  . propranolol (INDERAL) tablet 10 mg  10 mg Oral TID Lindell Spar I, NP   10 mg at 07/08/17 1150  . traZODone (DESYREL) tablet 100 mg  100 mg Oral QHS PRN Pennelope Bracken, MD   100 mg at 07/07/17 2144    PTA Medications: Medications Prior to Admission  Medication Sig Dispense Refill Last Dose  . B-D 3CC LUER-LOK SYR 25GX1" 25G X 1" 3 ML MISC USE AS DIRECTED WITH B12  5 06/24/2017  . budesonide (ENTOCORT EC) 3 MG 24 hr capsule Take 9 mg by mouth every morning.  0 07/02/2017  . cholestyramine  light (PREVALITE) 4 g packet Take 4 g by mouth daily.   07/02/2017  . clonazePAM (KLONOPIN) 2 MG tablet Take 2 mg by mouth 2 (two) times daily as needed for anxiety.   2 Past Month at Unknown time  . cyanocobalamin (,VITAMIN B-12,) 1000 MCG/ML injection INJECT 1ML WEEKLY  5 06/24/2017  . desvenlafaxine (PRISTIQ) 50 MG 24 hr tablet Take 50 mg by mouth daily.   07/02/2017  . dicyclomine (BENTYL) 10 MG capsule Take 10 mg by mouth daily as needed for spasms.   Past Month at Unknown time  . HYDROmorphone (DILAUDID) 4 MG tablet Take 4 mg by mouth every 4 (four) hours as needed for severe pain.   Past Week at Unknown time  . hydrOXYzine (VISTARIL) 25 MG capsule TAKE ONE CAPSULE BY MOUTH EVERY 6 HOURS AS NEEDED for anxiety  2 Past Month at Unknown time  . ondansetron (ZOFRAN-ODT) 8 MG disintegrating tablet 1 TABLET ON THE TONGUE AND ALLOW TO DISSOLVE EVERY 8 HRS ORALLY 30 DAYS  2 Past Week at Unknown time  . propranolol (INDERAL) 40 MG tablet Take 40 mg by mouth daily.   07/02/2017 at 0800  . ranitidine (ZANTAC) 150 MG tablet Take 150 mg by mouth 2 (two)  times daily.  1 Past Week at Unknown time    Patient Stressors: Marital or family conflict Other: "medication maintenance"  Patient Strengths: Ability for insight Average or above average intelligence Communication skills General fund of knowledge  Treatment Modalities: Medication Management, Group therapy, Case management,  1 to 1 session with clinician, Psychoeducation, Recreational therapy.   Physician Treatment Plan for Primary Diagnosis: MDD (major depressive disorder) Long Term Goal(s): Improvement in symptoms so as ready for discharge  Short Term Goals: Ability to identify changes in lifestyle to reduce recurrence of condition will improve Ability to identify and develop effective coping behaviors will improve Compliance with prescribed medications will improve  Medication Management: Evaluate patient's response, side effects, and  tolerance of medication regimen.  Therapeutic Interventions: 1 to 1 sessions, Unit Group sessions and Medication administration.  Evaluation of Outcomes: Progressing  Physician Treatment Plan for Secondary Diagnosis: Principal Problem:   MDD (major depressive disorder)   Long Term Goal(s): Improvement in symptoms so as ready for discharge  Short Term Goals: Ability to identify changes in lifestyle to reduce recurrence of condition will improve Ability to identify and develop effective coping behaviors will improve Compliance with prescribed medications will improve  Medication Management: Evaluate patient's response, side effects, and tolerance of medication regimen.  Therapeutic Interventions: 1 to 1 sessions, Unit Group sessions and Medication administration.  Evaluation of Outcomes: Progressing   RN Treatment Plan for Primary Diagnosis: MDD (major depressive disorder) Long Term Goal(s): Knowledge of disease and therapeutic regimen to maintain health will improve  Short Term Goals: Ability to identify and develop effective coping behaviors will improve and Compliance with prescribed medications will improve  Medication Management: RN will administer medications as ordered by provider, will assess and evaluate patient's response and provide education to patient for prescribed medication. RN will report any adverse and/or side effects to prescribing provider.  Therapeutic Interventions: 1 on 1 counseling sessions, Psychoeducation, Medication administration, Evaluate responses to treatment, Monitor vital signs and CBGs as ordered, Perform/monitor CIWA, COWS, AIMS and Fall Risk screenings as ordered, Perform wound care treatments as ordered.  Evaluation of Outcomes: Progressing   LCSW Treatment Plan for Primary Diagnosis: MDD (major depressive disorder) Long Term Goal(s): Safe transition to appropriate next level of care at discharge, Engage patient in therapeutic group addressing  interpersonal concerns.  Short Term Goals: Engage patient in aftercare planning with referrals and resources  Therapeutic Interventions: Assess for all discharge needs, 1 to 1 time with Social worker, Explore available resources and support systems, Assess for adequacy in community support network, Educate family and significant other(s) on suicide prevention, Complete Psychosocial Assessment, Interpersonal group therapy.  Evaluation of Outcomes: Met   Progress in Treatment: Attending groups: Yes Participating in groups: Yes Taking medication as prescribed: Yes Toleration medication: Yes, no side effects reported at this time Family/Significant other contact made: Yes Patient understands diagnosis: Yes AEB "Medications not working right" Discussing patient identified problems/goals with staff: Yes Medical problems stabilized or resolved: Yes Denies suicidal/homicidal ideation: Yes Issues/concerns per patient self-inventory: None Other: N/A  New problem(s) identified: None identified at this time.   New Short Term/Long Term Goal(s): None identified at this time.   Discharge Plan or Barriers:  Return home, follow up [place]  Reason for Continuation of Hospitalization: Anxiety   Depression  Medication stabilization   Estimated Length of Stay: 11/26  Attendees: Patient: Kayla Holden 07/08/2017  12:07 PM  Physician: Maris Berger, MD 07/08/2017  12:07 PM  Nursing: Sena Hitch, RN  07/08/2017  12:07 PM  RN Care Manager: Lars Pinks, RN 07/08/2017  12:07 PM  Social Worker: Ripley Fraise 07/08/2017  12:07 PM  Recreational Therapist: Winfield Cunas 07/08/2017  12:07 PM  Other: Norberto Sorenson 07/08/2017  12:07 PM  Other:  07/08/2017  12:07 PM    Scribe for Treatment Team:  Roque Lias LCSW 07/08/2017 12:07 PM

## 2017-07-08 NOTE — Plan of Care (Signed)
Pt remains safe on the unit

## 2017-07-08 NOTE — [facility] Group Notes (Signed)
Type of Therapy and Topic:  Group Therapy: Resilience  Participation Level:  Active   Description of Group:    In this group patients will be encouraged to explore what they see as obstacles to their own wellness and recovery. They will be guided to discuss their thoughts, feelings, and behaviors related to these obstacles. The group will process together ways to cope with barriers, with attention given to specific choices patients can make. Each patient will be challenged to identify changes they are motivated to make in order to overcome their obstacles. This group will be process-oriented, with patients participating in exploration of their own experiences as well as giving and receiving support and challenge from other group members.   Therapeutic Goals: 1. Patient will identify personal and current obstacles as they relate to admission. 2. Patient will identify barriers that currently interfere with their wellness or overcoming obstacles.  3. Patient will identify feelings, thought process and behaviors related to these barriers. 4. Patient will identify two changes they are willing to make to overcome these obstacles:      Summary of Patient Progress Stayed the entire time, engaged throughout. Says that being resilient "gives you the ability to bounce back and overcome". She mentions she was being resilient since last Thursday when she was admitted (ICU) due to medication where she had a central line for medication/blood draw placed. She's bouncing back from her dark place from not taking her medications. Her family and close friends has helped her get through. She also mentions having things to look forward to such as her dog coming to visit her helps. Mood was good, grounded in reality.     Therapeutic Modalities:   Cognitive Behavioral Therapy Solution Focused Therapy Motivational Interviewing Relapse Prevention Therapy    Kayla ShearsCassandra Sultana Holden MSW, LCSWA 07/08/2017 2:54 PM

## 2017-07-08 NOTE — Progress Notes (Signed)
Recreation Therapy Notes  Date: 07/08/17 Time: 1000 Location: 500 Hall Dayroom  Group Topic: Self-Expression  Goal Area(s) Addresses:  Patient will successfully identify the things they are thankful for. Patient will successfully identify benefit of expressing themselves in a healthy manner.  Behavioral Response: Engaged  Intervention: Paper, markers  Activity: What I'm Thankful For.  Patients were given a blank sheet of paper outlined with a picture frame.  Patients were to identify the things they are thankful for.  Patients could express their themselves through poem, song, or any appropriate way they choose.   Education:  Self-Expression, Building control surveyorDischarge Planning.   Education Outcome: Acknowledges education/In group clarification offered/Needs additional education  Clinical Observations/Feedback: Pt was bright and engaged throughout activity.  Pt stated she was thankful for "second chances, family/friends, my dogs and everyone who has given me a second chance and a chance at life".     Caroll RancherMarjette Wilfredo Canterbury, LRT/CTRS         Lillia AbedLindsay, Marabeth Melland A 07/08/2017 11:40 AM

## 2017-07-08 NOTE — Progress Notes (Signed)
DAR NOTE: Patient presents with anxious affect and depressed mood.  Pt kept coming to the nursing station asking questions, but has been calm and cooperative. Denies pain, auditory and visual hallucinations.  Rates depression at 5, hopelessness at 0, and anxiety at 5.  Maintained on routine safety checks.  Medications given as prescribed.  Support and encouragement offered as needed.  Attended group and participated. Patient observed socializing with peers in the dayroom.  Offered no complaint.

## 2017-07-08 NOTE — Progress Notes (Signed)
[facility] MD Progress Note  07/08/2017 3:22 PM HALEEMA VANDERHEYDEN  MRN:  [id number]  Subjective: Marcellene reports, "I'm doing well on my medicines. I'm eating well, just being careful on what I eat due to my colitis problems. I really did not sleep well last night".  Objective: Nona Gracey is a 36 y/o F with history of MDD who was admitted with worsening depression and overdose of diazepam. Pt reports that she is prescribed clozapine as an outpatient for chronic suicidal thoughts, but she ran out about 3 days ago. To address her increasing anxiety, pt reports that she took about "10 to 12" tablets of valium (unknown dose) that she had been previously prescribed. Pt began feeling dizzy and she then called her father who contacted emergency services. Pt required intubation in the ED. She was medically stabilized and transferred to [facility]. Today upon evaluation, she denies that overdose was a suicide attempt. She endorses depression symptoms of poor mood, guilty feelings, low energy, low motivation, and poor concentration. She denies symptoms of mania, OCD, and PTSD. She denies all illicit substance use. Chamari is seen today. She presents alert, oriented & aware of situation, She is visible on the unit, participating in the group sessions. Her affective is reactive & appropriate. She reports today that she has pain to her right foot which she broke last July, 2018. She says she has had xray done on it. She is encouraged to ask for prn pain medications already in progress. She denies any SIHI, AVH, delusional thoughts or paranoia. She does not appear to be responding to any internal stimuli.  Principal Problem: MDD (major depressive disorder)  Diagnosis:   Patient Active Problem List   Diagnosis Date Noted  . MDD (major depressive disorder) [F32.9] 07/06/2017  . Lymphocytic colitis [K52.832] 08/22/2016  . Conversion disorder [F44.9] 06/20/2015  . Chronic daily headache [R51] 05/09/2015   Total Time spent with  patient: 25 minutes  Past Psychiatric History: See H&P  Past Medical History:  Past Medical History:  Diagnosis Date  . Anxiety   . Chiari malformation   . Depression   . GERD (gastroesophageal reflux disease)   . Microscopic colitis   . Migraine headache     Past Surgical History:  Procedure Laterality Date  . APPENDECTOMY    . CHOLECYSTECTOMY    . KNEE SURGERY Left    3 times   Family History:  Family History  Problem Relation Age of Onset  . Cancer Maternal Grandmother   . Cancer Paternal Grandfather   . Allergic rhinitis Neg Hx   . Angioedema Neg Hx   . Asthma Neg Hx   . Atopy Neg Hx   . Eczema Neg Hx   . Urticaria Neg Hx   . Immunodeficiency Neg Hx    Family Psychiatric  History: See H&P.  Social History:  Social History   Substance and Sexual Activity  Alcohol Use No     Social History   Substance and Sexual Activity  Drug Use No    Social History   Socioeconomic History  . Marital status: Single    Spouse name: None  . Number of children: None  . Years of education: None  . Highest education level: None  Social Needs  . Financial resource strain: None  . Food insecurity - worry: None  . Food insecurity - inability: None  . Transportation needs - medical: None  . Transportation needs - non-medical: None  Occupational History  . None  Tobacco Use  .  Smoking status: Never Smoker  . Smokeless tobacco: Never Used  Substance and Sexual Activity  . Alcohol use: No  . Drug use: No  . Sexual activity: None  Other Topics Concern  . None  Social History Narrative  . None   Additional Social History:    Pain Medications: see PTA meds Prescriptions: see PTA meds Over the Counter: see PTA meds History of alcohol / drug use?: No history of alcohol / drug abuse  Sleep: Fair  Appetite:  Good  Current Medications: Current Facility-Administered Medications  Medication Dose Route Frequency Provider Last Rate Last Dose  . acetaminophen  (TYLENOL) tablet 650 mg  650 mg Oral Q6H PRN Fransisca Kaufmannavis, Laura A, NP   650 mg at 07/08/17 1150  . alum & mag hydroxide-simeth (MAALOX/MYLANTA) 200-200-20 MG/5ML suspension 30 mL  30 mL Oral Q4H PRN Fransisca Kaufmannavis, Laura A, NP   30 mL at 07/08/17 1316  . budesonide (ENTOCORT EC) 24 hr capsule 9 mg  9 mg Oral BH-q7a Simon, Spencer E, PA-C   9 mg at 07/08/17 [id number]  . desvenlafaxine (PRISTIQ) 24 hr tablet 50 mg  50 mg Oral Daily Armandina StammerNwoko, Jovan Colligan I, NP   50 mg at 07/08/17 0807  . gabapentin (NEURONTIN) capsule 100 mg  100 mg Oral TID Armandina StammerNwoko, Arine Foley I, NP   100 mg at 07/08/17 1150  . hydrOXYzine (ATARAX/VISTARIL) tablet 50 mg  50 mg Oral Q6H PRN Micheal Likensainville, Christopher T, MD   50 mg at 07/08/17 1317  . magnesium hydroxide (MILK OF MAGNESIA) suspension 30 mL  30 mL Oral Daily PRN Fransisca Kaufmannavis, Laura A, NP      . pantoprazole (PROTONIX) EC tablet 40 mg  40 mg Oral Daily Armandina StammerNwoko, Deante Blough I, NP   40 mg at 07/08/17 0807  . phenol (CHLORASEPTIC) mouth spray 1 spray  1 spray Mouth/Throat PRN Kerry HoughSimon, Spencer E, PA-C   1 spray at 07/07/17 1340  . propranolol (INDERAL) tablet 10 mg  10 mg Oral TID Armandina StammerNwoko, Ellanie Oppedisano I, NP   10 mg at 07/08/17 1150  . traZODone (DESYREL) tablet 100 mg  100 mg Oral QHS PRN Micheal Likensainville, Christopher T, MD   100 mg at 07/07/17 2144   Lab Results: No results found for this or any previous visit (from the past 48 hour(s)).  Blood Alcohol level:  [facility] results found for: [facility]  Metabolic Disorder Labs: No results found for: HGBA1C, MPG No results found for: PROLACTIN No results found for: CHOL, TRIG, HDL, CHOLHDL, VLDL, LDLCALC  Physical Findings: AIMS: Facial and Oral Movements Muscles of Facial Expression: None, normal Lips and Perioral Area: None, normal Jaw: None, normal Tongue: None, normal,Extremity Movements Upper (arms, wrists, hands, fingers): None, normal Lower (legs, knees, ankles, toes): None, normal, Trunk Movements Neck, shoulders, hips: None, normal, Overall Severity Severity of abnormal movements (highest  score from questions above): None, normal Incapacitation due to abnormal movements: None, normal Patient's awareness of abnormal movements (rate only patient's report): No Awareness, Dental Status Current problems with teeth and/or dentures?: No Does patient usually wear dentures?: No  CIWA:    COWS:     Musculoskeletal: Strength & Muscle Tone: within normal limits Gait & Station: normal Patient leans: N/A  Psychiatric Specialty Exam: Physical Exam  Nursing note and vitals reviewed.   Review of Systems  Psychiatric/Behavioral: Positive for depression ("Improving") and substance abuse (Hx. Cocaine use disorder). Negative for hallucinations, memory loss and suicidal ideas. The patient is nervous/anxious ("Improving") and has insomnia.     Blood pressure Marland Kitchen(!)  134/94, pulse 96, temperature 98.3 F (36.8 C), temperature source Oral, resp. rate 20, height 5\' 3"  (1.6 m), weight 101.2 kg (223 lb).Body mass index is 39.5 kg/m.  General Appearance: Casual and Fairly Groomed  Eye Contact:  Good  Speech:  Clear and Coherent and Normal Rate  Volume:  Normal  Mood:  Anxious and Depressed  Affect:  Congruent and Constricted  Thought Process:  Coherent and Goal Directed  Orientation:  Full (Time, Place, and Person)  Thought Content:  Logical  Suicidal Thoughts:  No  Homicidal Thoughts:  No  Memory:  Immediate;   Fair Recent;   Fair Remote;   Fair  Judgement:  Fair  Insight:  Fair  Psychomotor Activity:  Normal  Concentration:  Concentration: Good  Recall:  Good  Fund of Knowledge:  Fair  Language:  Fair  Akathisia:  No  Handed:    AIMS (if indicated):     Assets:  Communication Skills Desire for Improvement Financial Resources/Insurance Housing Leisure Time Physical Health Resilience Social Support  ADL's:  Intact  Cognition:  WNL  Sleep:  Number of Hours: 5.5     Treatment Plan Summary: Daily contact with patient to assess and evaluate symptoms and progress in treatment:  Di KindleLaurel is at her baseline. No evidence of psychosis or mania. She denies any new mental health issues. We are continuing her care to full stabilization.   Will continue today 07/08/2017 plan as below except where it is noted.  Depression     -Continue Pristiq 50 mg po qd.  Agitation/anxiety      -Continue Gabapentin 100 mg po tid.      -Continue Hydroxyzine 50 mg po Q 6 hours prn,      -Continue Propranolol 10 mg po tid  Insomnia      -Increased Trazodone from 100 to 150 mg po Q hs prn.  Will continue continue all other pertinent home medications as recommended for the other medical issues presented.  Encourage group participation. SW to continue to work on the discharge disposition.  Sanjuana KavaNwoko, Sharhonda Atwood I, NP, PMHNP, FNP-BC 07/08/2017, 3:22 PM

## 2017-07-08 NOTE — Progress Notes (Signed)
Pt moved to 400 hall.

## 2017-07-09 DIAGNOSIS — M25579 Pain in unspecified ankle and joints of unspecified foot: Secondary | ICD-10-CM | POA: Diagnosis present

## 2017-07-09 DIAGNOSIS — F39 Unspecified mood [affective] disorder: Secondary | ICD-10-CM

## 2017-07-09 MED ORDER — ACETAMINOPHEN 500 MG PO TABS
1000.0000 mg | ORAL_TABLET | Freq: Three times a day (TID) | ORAL | Status: DC | PRN
  Administered 2017-07-09 – 2017-07-11 (×6): 1000 mg via ORAL
  Filled 2017-07-09 (×6): qty 2

## 2017-07-09 NOTE — Progress Notes (Signed)
Pt attended evening wrap up group and stated that today was a 9, her [facility] came [facility] visit [facility]. She stated that were finally able to "clear the air" and have a positive conversation.

## 2017-07-09 NOTE — Plan of Care (Signed)
  Activity: Interest or engagement in activities will improve 07/09/2017 1823 - Progressing by Angela AdamBeaudry, Etha Stambaugh E, RN   Activity: Sleeping patterns will improve 07/09/2017 1823 - Progressing by Angela AdamBeaudry, Jamesmichael Shadd E, RN

## 2017-07-09 NOTE — Progress Notes (Signed)
Nursing Progress Note: 7p-7a D: Pt currently presents with a anxious/concrete/somatic affect and behavior. Pt states "My feet are all swollen and I need lasix. My head always hurts and I need more tylenol. I need more meds or I'm going straight back to the ED." Interacting appropriately with the milieu. Pt reports good sleep during the previous night with current medication regimen. Pt did attend wrap-up group.  A: Pt provided with medications per providers orders. Pt's labs and vitals were monitored throughout the night. Pt skin in lower extremities assessed and writer found that the pt had +3 pitting edema around interior ankles bilaterally. Pt was educated on the importance of elevation and heat/cold therapy to reduce swelling and redness. Pt supported emotionally and encouraged to express concerns and questions. Pt educated on medications.  R: Pt's safety ensured with 15 minute and environmental checks. Pt currently denies SI, HI, and AVH. Pt verbally contracts to seek staff if SI,HI, or AVH occurs and to consult with staff before acting on any harmful thoughts. Will continue to monitor.

## 2017-07-09 NOTE — Progress Notes (Signed)
[facility]  07/09/2017 11:04 AM [name] A Sar  MRN:  [id number]   Subjective:  Patient reports that she is feeling better today. She reports her depression at 2/10 and her anxiety at 2/10. She denies any AVH. She reports that her ankle hurts and has asked to have her Tylenol increased.  Objective: Patient's chart and findings reviewed and discussed with treatment team. Patient is pleasant and cooperative. She is ambulating and stable. Her right ankle is bruised. Will increase the Tylenol to 1000 mg Q8H PRN for pain. Will continue all other medications as prescribed.  Principal Problem: MDD (major depressive disorder) Diagnosis:   Patient Active Problem List   Diagnosis Date Noted  . MDD (major depressive disorder) [F32.9] 07/06/2017  . Lymphocytic colitis [K52.832] 08/22/2016  . Conversion disorder [F44.9] 06/20/2015  . Chronic daily headache [R51] 05/09/2015   Total Time spent with patient: 25 minutes  Past Psychiatric History: See H&P  Past Medical History:  Past Medical History:  Diagnosis Date  . Anxiety   . Chiari malformation   . Depression   . GERD (gastroesophageal reflux disease)   . Microscopic colitis   . Migraine headache     Past Surgical History:  Procedure Laterality Date  . APPENDECTOMY    . CHOLECYSTECTOMY    . KNEE SURGERY Left    3 times   Family History:  Family History  Problem Relation Age of Onset  . Cancer Maternal Grandmother   . Cancer Paternal Grandfather   . Allergic rhinitis Neg Hx   . Angioedema Neg Hx   . Asthma Neg Hx   . Atopy Neg Hx   . Eczema Neg Hx   . Urticaria Neg Hx   . Immunodeficiency Neg Hx    Family Psychiatric  History: See H&P Social History:  Social History   Substance and Sexual Activity  Alcohol Use No     Social History   Substance and Sexual Activity  Drug Use No    Social History   Socioeconomic History  . Marital status: Single    Spouse name: None  . Number of children: None  .  Years of education: None  . Highest education level: None  Social Needs  . Financial resource strain: None  . Food insecurity - worry: None  . Food insecurity - inability: None  . Transportation needs - medical: None  . Transportation needs - non-medical: None  Occupational History  . None  Tobacco Use  . Smoking status: Never Smoker  . Smokeless tobacco: Never Used  Substance and Sexual Activity  . Alcohol use: No  . Drug use: No  . Sexual activity: None  Other Topics Concern  . None  Social History Narrative  . None   Additional Social History:    Pain Medications: see PTA meds Prescriptions: see PTA meds Over the Counter: see PTA meds History of alcohol / drug use?: No history of alcohol / drug abuse                    Sleep: Good  Appetite:  Good  Current Medications: Current Facility-Administered Medications  Medication Dose Route Frequency Provider Last Rate Last Dose  . acetaminophen (TYLENOL) tablet 1,000 mg  1,000 mg Oral Q8H PRN Suhas Estis, Gerlene Burdockravis B, FNP      . alum & mag hydroxide-simeth (MAALOX/MYLANTA) 200-200-20 MG/5ML suspension 30 mL  30 mL Oral Q4H PRN Fransisca Kaufmannavis, Laura A, NP   30 mL at 07/09/17 0804  .  budesonide (ENTOCORT EC) 24 hr capsule 9 mg  9 mg Oral BH-q7a Simon, Spencer E, PA-C   9 mg at 07/09/17 0549  . desvenlafaxine (PRISTIQ) 24 hr tablet 50 mg  50 mg Oral Daily Armandina Stammer I, NP   50 mg at 07/09/17 0807  . gabapentin (NEURONTIN) capsule 100 mg  100 mg Oral TID Armandina Stammer I, NP   100 mg at 07/09/17 0807  . hydrOXYzine (ATARAX/VISTARIL) tablet 50 mg  50 mg Oral Q6H PRN Micheal Likens, MD   50 mg at 07/09/17 0549  . magnesium hydroxide (MILK OF MAGNESIA) suspension 30 mL  30 mL Oral Daily PRN Fransisca Kaufmann A, NP      . pantoprazole (PROTONIX) EC tablet 40 mg  40 mg Oral Daily Armandina Stammer I, NP   40 mg at 07/09/17 0807  . phenol (CHLORASEPTIC) mouth spray 1 spray  1 spray Mouth/Throat PRN Kerry Hough, PA-C   1 spray at 07/07/17  1340  . propranolol (INDERAL) tablet 10 mg  10 mg Oral TID Armandina Stammer I, NP   10 mg at 07/09/17 0807  . traZODone (DESYREL) tablet 100 mg  100 mg Oral QHS PRN Micheal Likens, MD   100 mg at 07/08/17 2104    Lab Results: No results found for this or any previous visit (from the past 48 hour(s)).  Blood Alcohol level:  No results found for: [facility]  Metabolic Disorder Labs: No results found for: HGBA1C, MPG No results found for: PROLACTIN No results found for: CHOL, TRIG, HDL, CHOLHDL, VLDL, LDLCALC  Physical Findings: AIMS: Facial and Oral Movements Muscles of Facial Expression: None, normal Lips and Perioral Area: None, normal Jaw: None, normal Tongue: None, normal,Extremity Movements Upper (arms, wrists, hands, fingers): None, normal Lower (legs, knees, ankles, toes): None, normal, Trunk Movements Neck, shoulders, hips: None, normal, Overall Severity Severity of abnormal movements (highest score from questions above): None, normal Incapacitation due to abnormal movements: None, normal Patient's awareness of abnormal movements (rate only patient's report): No Awareness, Dental Status Current problems with teeth and/or dentures?: No Does patient usually wear dentures?: No  CIWA:    COWS:     Musculoskeletal: Strength & Muscle Tone: within normal limits Gait & Station: normal Patient leans: N/A  Psychiatric Specialty Exam: Physical Exam  Nursing note and vitals reviewed. Constitutional: She is oriented to person, place, and time. She appears well-developed and well-nourished.  Respiratory: Effort normal.  Musculoskeletal: Normal range of motion.  Neurological: She is alert and oriented to person, place, and time.  Skin: Skin is warm.    Review of Systems  Constitutional: Negative.   HENT: Negative.   Eyes: Negative.   Respiratory: Negative.   Cardiovascular: Negative.   Gastrointestinal: Negative.   Genitourinary: Negative.   Musculoskeletal: Negative.    Skin: Negative.   Neurological: Negative.   Endo/Heme/Allergies: Negative.   Psychiatric/Behavioral: Positive for depression. Negative for hallucinations and suicidal ideas. The patient is nervous/anxious.     Blood pressure (!) 134/95, pulse (!) 110, temperature (!) 97.5 F (36.4 C), temperature source Oral, resp. rate 16, height 5\' 3"  (1.6 m), weight 101.2 kg (223 lb).Body mass index is 39.5 kg/m.  General Appearance: Casual  Eye Contact:  Good  Speech:  Clear and Coherent and Normal Rate  Volume:  Normal  Mood:  Depressed  Affect:  Flat  Thought Process:  Goal Directed and Descriptions of Associations: Intact  Orientation:  Full (Time, Place, and Person)  Thought Content:  WDL  Suicidal Thoughts:  No  Homicidal Thoughts:  No  Memory:  Immediate;   Good Recent;   Good Remote;   Good  Judgement:  Fair  Insight:  Fair  Psychomotor Activity:  Normal  Concentration:  Concentration: Good and Attention Span: Good  Recall:  Good  Fund of Knowledge:  Good  Language:  Good  Akathisia:  No  Handed:  Right  AIMS (if indicated):     Assets:  Communication Skills Desire for Improvement Financial Resources/Insurance Housing Physical Health Social Support Transportation  ADL's:  Intact  Cognition:  WNL  Sleep:  Number of Hours: 5.75   Problems Addressed: MDD  Pain in right ankle  Treatment Plan Summary: Daily contact with patient to assess and evaluate symptoms and progress in treatment, Medication management and Plan is to:  -Continue Pristiq 50 mg PO Daily for mood stability -Continue Neurontin 100 mg PO TID -Increase Tylenol 1000 mg PO Q8H RN for pain  -Continue Vistaril 50 mg PO Q6H PRN for anxiety -Continue Inderal 10 mg PO TID for anxiety -Continue Trazodone 100 mg PO QHS PRN for insomnia -Encourage group therapy participation  Maryfrances Bunnellravis B Tamyah Cutbirth, FNP 07/09/2017, 11:04 AM

## 2017-07-09 NOTE — Progress Notes (Signed)
Patient ID: Kayla Holden, female   DOB: [birth year], 36 y.o.   MRN: [id number]   D: Patient overly pleasant on approach. Attention seeking but appropriate at this time. Reports that the doctor was suppose to increase her trazodone and Tylenol today but no new order received. Patient denies any SI at this time and contracts for safety on the unit. Complaining of anxiety and got vistaril earlier. A: Staff will continue to monitor on q 15 minute checks, follow-treatment plan, and give medications as prescribed.  R: Cooperative on the unit.

## 2017-07-10 MED ORDER — GABAPENTIN 100 MG PO CAPS
200.0000 mg | ORAL_CAPSULE | Freq: Three times a day (TID) | ORAL | Status: DC
  Administered 2017-07-10 – 2017-07-11 (×4): 200 mg via ORAL
  Filled 2017-07-10 (×9): qty 2

## 2017-07-10 MED ORDER — TRAZODONE HCL 100 MG PO TABS
100.0000 mg | ORAL_TABLET | Freq: Every evening | ORAL | Status: DC | PRN
  Administered 2017-07-10: 100 mg via ORAL

## 2017-07-10 NOTE — Tx Team (Signed)
Interdisciplinary Treatment and Diagnostic Plan Update  07/10/2017 Time of Session: 9:00 AM Kayla Holden MRN: [id number]  Principal Diagnosis: MDD (major depressive disorder)  Secondary Diagnoses: Principal Problem:   MDD (major depressive disorder) Active Problems:   Pain in joint, ankle and foot   Current Medications:  Current Facility-Administered Medications  Medication Dose Route Frequency Provider Last Rate Last Dose  . acetaminophen (TYLENOL) tablet 1,000 mg  1,000 mg Oral Q8H PRN Money, Gerlene Burdock, FNP   1,000 mg at 07/10/17 1324  . alum & mag hydroxide-simeth (MAALOX/MYLANTA) 200-200-20 MG/5ML suspension 30 mL  30 mL Oral Q4H PRN Fransisca Kaufmann A, NP   30 mL at 07/10/17 0740  . budesonide (ENTOCORT EC) 24 hr capsule 9 mg  9 mg Oral BH-q7a Simon, Spencer E, PA-C   9 mg at 07/10/17 4010  . desvenlafaxine (PRISTIQ) 24 hr tablet 50 mg  50 mg Oral Daily Armandina Stammer I, NP   50 mg at 07/10/17 0744  . gabapentin (NEURONTIN) capsule 100 mg  100 mg Oral TID Armandina Stammer I, NP   100 mg at 07/10/17 0742  . hydrOXYzine (ATARAX/VISTARIL) tablet 50 mg  50 mg Oral Q6H PRN Micheal Likens, MD   50 mg at 07/10/17 0932  . magnesium hydroxide (MILK OF MAGNESIA) suspension 30 mL  30 mL Oral Daily PRN Fransisca Kaufmann A, NP      . pantoprazole (PROTONIX) EC tablet 40 mg  40 mg Oral Daily Armandina Stammer I, NP   40 mg at 07/10/17 0742  . phenol (CHLORASEPTIC) mouth spray 1 spray  1 spray Mouth/Throat PRN Kerry Hough, PA-C   1 spray at 07/07/17 1340  . propranolol (INDERAL) tablet 10 mg  10 mg Oral TID Armandina Stammer I, NP   10 mg at 07/10/17 0743  . traZODone (DESYREL) tablet 100 mg  100 mg Oral QHS PRN Micheal Likens, MD   100 mg at 07/09/17 2125   PTA Medications: Medications Prior to Admission  Medication Sig Dispense Refill Last Dose  . B-D 3CC LUER-LOK SYR 25GX1" 25G X 1" 3 ML MISC USE AS DIRECTED WITH B12  5 06/24/2017  . budesonide (ENTOCORT EC) 3 MG 24 hr capsule Take 9 mg  by mouth every morning.  0 07/02/2017  . cholestyramine light (PREVALITE) 4 g packet Take 4 g by mouth daily.   07/02/2017  . clonazePAM (KLONOPIN) 2 MG tablet Take 2 mg by mouth 2 (two) times daily as needed for anxiety.   2 Past Month at Unknown time  . cyanocobalamin (,VITAMIN B-12,) 1000 MCG/ML injection INJECT WEEKLY  5 06/24/2017  . desvenlafaxine (PRISTIQ) 50 MG 24 hr tablet Take 50 mg by mouth daily.   07/02/2017  . dicyclomine (BENTYL) 10 MG capsule Take 10 mg by mouth daily as needed for spasms.   Past Month at Unknown time  . HYDROmorphone (DILAUDID) 4 MG tablet Take 4 mg by mouth every 4 (four) hours as needed for severe pain.   Past Week at Unknown time  . hydrOXYzine (VISTARIL) 25 MG capsule TAKE ONE CAPSULE BY MOUTH EVERY 6 HOURS AS NEEDED for anxiety  2 Past Month at Unknown time  . ondansetron (ZOFRAN-ODT) 8 MG disintegrating tablet 1 TABLET ON THE TONGUE AND ALLOW TO DISSOLVE EVERY 8 HRS ORALLY 30 DAYS  2 Past Week at Unknown time  . propranolol (INDERAL) 40 MG tablet Take 40 mg by mouth daily.   07/02/2017 at 0800  . ranitidine (ZANTAC) 150 MG  tablet Take 150 mg by mouth 2 (two) times daily.  1 Past Week at Unknown time    Patient Stressors: Marital or family conflict Other: "medication maintenance"  Patient Strengths: Ability for insight Average or above average intelligence Communication skills General fund of knowledge  Treatment Modalities: Medication Management, Group therapy, Case management,  1 to 1 session with clinician, Psychoeducation, Recreational therapy.   Physician Treatment Plan for Primary Diagnosis: MDD (major depressive disorder) Long Term Goal(s): Improvement in symptoms so as ready for discharge Improvement in symptoms so as ready for discharge   Short Term Goals: Ability to identify changes in lifestyle to reduce recurrence of condition will improve Ability to identify and develop effective coping behaviors will improve Compliance with  prescribed medications will improve  Medication Management: Evaluate patient's response, side effects, and tolerance of medication regimen.  Therapeutic Interventions: 1 to 1 sessions, Unit Group sessions and Medication administration.  Evaluation of Outcomes: Progressing    Physician Treatment Plan for Secondary Diagnosis: Principal Problem:   MDD (major depressive disorder) Active Problems:   Pain in joint, ankle and foot  Long Term Goal(s): Improvement in symptoms so as ready for discharge Improvement in symptoms so as ready for discharge   Short Term Goals: Ability to identify changes in lifestyle to reduce recurrence of condition will improve Ability to identify and develop effective coping behaviors will improve Compliance with prescribed medications will improve     Medication Management: Evaluate patient's response, side effects, and tolerance of medication regimen.  Therapeutic Interventions: 1 to 1 sessions, Unit Group sessions and Medication administration.  Evaluation of Outcomes: Progressing   RN Treatment Plan for Primary Diagnosis: MDD (major depressive disorder) Long Term Goal(s): Knowledge of disease and therapeutic regimen to maintain health will improve  Short Term Goals: Ability to participate in decision making will improve, Ability to verbalize feelings will improve and Ability to disclose and discuss suicidal ideas  Medication Management: RN will administer medications as ordered by provider, will assess and evaluate patient's response and provide education to patient for prescribed medication. RN will report any adverse and/or side effects to prescribing provider.  Therapeutic Interventions: 1 on 1 counseling sessions, Psychoeducation, Medication administration, Evaluate responses to treatment, Monitor vital signs and CBGs as ordered, Perform/monitor CIWA, COWS, AIMS and Fall Risk screenings as ordered, Perform wound care treatments as ordered.  Evaluation  of Outcomes: Progressing   LCSW Treatment Plan for Primary Diagnosis: MDD (major depressive disorder) Long Term Goal(s): Safe transition to appropriate next level of care at discharge, Engage patient in therapeutic group addressing interpersonal concerns.  Short Term Goals: Engage patient in aftercare planning with referrals and resources, Increase ability to appropriately verbalize feelings, Increase emotional regulation, Identify triggers associated with mental health/substance abuse issues and Increase skills for wellness and recovery  Therapeutic Interventions: Assess for all discharge needs, 1 to 1 time with Social worker, Explore available resources and support systems, Assess for adequacy in community support network, Educate family and significant other(s) on suicide prevention, Complete Psychosocial Assessment, Interpersonal group therapy.  Evaluation of Outcomes: Progressing   Progress in Treatment: Attending groups: Yes. Participating in groups: Yes. Taking medication as prescribed: Yes. Toleration medication: Yes. Family/Significant other contact made: Yes, individual(s) contacted:  father Patient understands diagnosis: Yes. Discussing patient identified problems/goals with staff: Yes. Medical problems stabilized or resolved: Yes. Denies suicidal/homicidal ideation: Yes. Admitted w suicide attempt, contracts for safety on unit Issues/concerns per patient self-inventory: No. Other: NA  New problem(s) identified: No, Describe:  none  at this time, CSW assessing for any issues  New Short Term/Long Term Goal(s): "adjust medications", suicidal thoughts  Discharge Plan or Barriers: return home, follow up outpatient  Reason for Continuation of Hospitalization: Anxiety Depression Medication stabilization Suicidal ideation  Estimated Length of Stay:  3 - 5 days, 11/10/16  Attendees: Patient: Kayla Holden 07/10/2017 10:19 AM  Physician: Cristino MartesV Izediuno MD 07/10/2017 10:19 AM   Nursing: Maryjo RochesterShalita RN 07/10/2017 10:19 AM  RN Care Manager: 07/10/2017 10:19 AM  Social Worker: Governor RooksA Cunningham Richardean CanalLCSW, C Jarrett LCSW 07/10/2017 10:19 AM  Recreational Therapist:  07/10/2017 10:19 AM  Other:  07/10/2017 10:19 AM  Other:  07/10/2017 10:19 AM  Other: 07/10/2017 10:19 AM    Scribe for Treatment Team: Sallee LangeAnne C Cunningham, LCSW 07/10/2017 10:19 AM

## 2017-07-10 NOTE — Progress Notes (Signed)
Pt attended evening wrap up group and stated that today was a 2 for her she was not able to get the help she needed for her leg and was frustrated with "not knowing what was going on after speaking with the staff". Pt did speak with current nurse on shift and informed of issue with leg (foot) and is waiting to see what medication can be prescribed besides Tylenol.

## 2017-07-10 NOTE — Progress Notes (Signed)
[facility]  07/10/2017 11:02 AM Kayla Holden  MRN:  [id number]   Subjective:  Patient reports that she wants to be discharged so she can follow up with her medical provider. She wants to have her ankle looked at again, she reports new edema, but then states that she has had this for a while. She then reports that because she was intubated she vomits when coughing (not confirmed). She reports already having appointments with [facility] for next week and she states taht she has a friend coming in this weekend that could stay with her.   Objective: Patient's chart and findings reviewed and discussed with treatment team. Patient is very somatic and using medical issues for earlier than anticipated discharge. Her speech is still slightly pressured and is tangential. Due to complaint of pain will increase Gabapentin to 200 mg TID. Will possibly discharge by Sunday. Patient needs to be monitored a little longer due to pressured speech and medication changes.  Principal Problem: MDD (major depressive disorder) Diagnosis:   Patient Active Problem List   Diagnosis Date Noted  . Pain in joint, ankle and foot [M25.579] 07/09/2017  . MDD (major depressive disorder) [F32.9] 07/06/2017  . Lymphocytic colitis [K52.832] 08/22/2016  . Conversion disorder [F44.9] 06/20/2015  . Chronic daily headache [R51] 05/09/2015   Total Time spent with patient: 25 minutes  Past Psychiatric History: See H&P  Past Medical History:  Past Medical History:  Diagnosis Date  . Anxiety   . Chiari malformation   . Depression   . GERD (gastroesophageal reflux disease)   . Microscopic colitis   . Migraine headache     Past Surgical History:  Procedure Laterality Date  . APPENDECTOMY    . CHOLECYSTECTOMY    . KNEE SURGERY Left    3 times   Family History:  Family History  Problem Relation Age of Onset  . Cancer Maternal Grandmother   . Cancer Paternal Grandfather   . Allergic rhinitis Neg  Hx   . Angioedema Neg Hx   . Asthma Neg Hx   . Atopy Neg Hx   . Eczema Neg Hx   . Urticaria Neg Hx   . Immunodeficiency Neg Hx    Family Psychiatric  History: See H&P Social History:  Social History   Substance and Sexual Activity  Alcohol Use No     Social History   Substance and Sexual Activity  Drug Use No    Social History   Socioeconomic History  . Marital status: Single    Spouse name: None  . Number of children: None  . Years of education: None  . Highest education level: None  Social Needs  . Financial resource strain: None  . Food insecurity - worry: None  . Food insecurity - inability: None  . Transportation needs - medical: None  . Transportation needs - non-medical: None  Occupational History  . None  Tobacco Use  . Smoking status: Never Smoker  . Smokeless tobacco: Never Used  Substance and Sexual Activity  . Alcohol use: No  . Drug use: No  . Sexual activity: None  Other Topics Concern  . None  Social History Narrative  . None   Additional Social History:    Pain Medications: see PTA meds Prescriptions: see PTA meds Over the Counter: see PTA meds History of alcohol / drug use?: No history of alcohol / drug abuse  Sleep: Good  Appetite:  Good  Current Medications: Current Facility-Administered Medications  Medication Dose Route Frequency Provider Last Rate Last Dose  . acetaminophen (TYLENOL) tablet 1,000 mg  1,000 mg Oral Q8H PRN Mckinna Demars, Gerlene Burdockravis B, FNP   1,000 mg at 07/10/17 [id number]  . alum & mag hydroxide-simeth (MAALOX/MYLANTA) 200-200-20 MG/5ML suspension 30 mL  30 mL Oral Q4H PRN Fransisca Kaufmannavis, Laura A, NP   30 mL at 07/10/17 0740  . budesonide (ENTOCORT EC) 24 hr capsule 9 mg  9 mg Oral BH-q7a Simon, Spencer E, PA-C   9 mg at 07/10/17 [id number]  . desvenlafaxine (PRISTIQ) 24 hr tablet 50 mg  50 mg Oral Daily Armandina StammerNwoko, Agnes I, NP   50 mg at 07/10/17 0744  . gabapentin (NEURONTIN) capsule 200 mg  200 mg Oral TID Grayland Daisey, Gerlene Burdockravis  B, FNP      . hydrOXYzine (ATARAX/VISTARIL) tablet 50 mg  50 mg Oral Q6H PRN Micheal Likensainville, Christopher T, MD   50 mg at 07/10/17 0932  . magnesium hydroxide (MILK OF MAGNESIA) suspension 30 mL  30 mL Oral Daily PRN Fransisca Kaufmannavis, Laura A, NP      . pantoprazole (PROTONIX) EC tablet 40 mg  40 mg Oral Daily Armandina StammerNwoko, Agnes I, NP   40 mg at 07/10/17 0742  . phenol (CHLORASEPTIC) mouth spray 1 spray  1 spray Mouth/Throat PRN Kerry HoughSimon, Spencer E, PA-C   1 spray at 07/07/17 1340  . propranolol (INDERAL) tablet 10 mg  10 mg Oral TID Armandina StammerNwoko, Agnes I, NP   10 mg at 07/10/17 0743  . traZODone (DESYREL) tablet 100 mg  100 mg Oral QHS PRN Micheal Likensainville, Christopher T, MD   100 mg at 07/09/17 2125    Lab Results: No results found for this or any previous visit (from the past 48 hour(s)).  Blood Alcohol level:  No results found for: [facility]  Metabolic Disorder Labs: No results found for: HGBA1C, MPG No results found for: PROLACTIN No results found for: CHOL, TRIG, HDL, CHOLHDL, VLDL, LDLCALC  Physical Findings: AIMS: Facial and Oral Movements Muscles of Facial Expression: None, normal Lips and Perioral Area: None, normal Jaw: None, normal Tongue: None, normal,Extremity Movements Upper (arms, wrists, hands, fingers): None, normal Lower (legs, knees, ankles, toes): None, normal, Trunk Movements Neck, shoulders, hips: None, normal, Overall Severity Severity of abnormal movements (highest score from questions above): None, normal Incapacitation due to abnormal movements: None, normal Patient's awareness of abnormal movements (rate only patient's report): No Awareness, Dental Status Current problems with teeth and/or dentures?: No Does patient usually wear dentures?: No  CIWA:    COWS:     Musculoskeletal: Strength & Muscle Tone: within normal limits Gait & Station: normal Patient leans: N/A  Psychiatric Specialty Exam: Physical Exam  Nursing note and vitals reviewed. Constitutional: She is oriented to person, place,  and time. She appears well-developed and well-nourished.  Cardiovascular: Normal rate.  Respiratory: Effort normal.  Musculoskeletal: Normal range of motion.  Neurological: She is alert and oriented to person, place, and time.  Skin: Skin is warm.    Review of Systems  Constitutional: Negative.   HENT: Negative.   Eyes: Negative.   Respiratory: Negative.   Cardiovascular: Negative.   Gastrointestinal: Negative.   Genitourinary: Negative.   Musculoskeletal: Positive for joint pain.  Skin: Negative.   Neurological: Negative.   Endo/Heme/Allergies: Negative.     Blood pressure (!) 167/96, pulse 95, temperature 98.4 F (36.9 C), temperature source Oral, resp. rate 18, height 5\' 3"  (1.6 m), weight 101.2  kg (223 lb).Body mass index is 39.5 kg/m.  General Appearance: Casual  Eye Contact:  Good  Speech:  Slightly pressured  Volume:  Normal  Mood:  Anxious  Affect:  Congruent  Thought Process:  Goal Directed and Descriptions of Associations: Tangential  Orientation:  Full (Time, Place, and Person)  Thought Content:  WDL  Suicidal Thoughts:  No  Homicidal Thoughts:  No  Memory:  Immediate;   Good Recent;   Good Remote;   Good  Judgement:  Fair  Insight:  Fair  Psychomotor Activity:  Normal  Concentration:  Concentration: Good and Attention Span: Good  Recall:  Good  Fund of Knowledge:  Good  Language:  Good  Akathisia:  No  Handed:  Right  AIMS (if indicated):     Assets:  Communication Skills Desire for Improvement Financial Resources/Insurance Housing Physical Health Social Support Transportation  ADL's:  Intact  Cognition:  WNL  Sleep:  Number of Hours: 6.25   Problems Addressed: MDD severe Right ankle pain  Treatment Plan Summary: Daily contact with patient to assess and evaluate symptoms and progress in treatment, Medication management and Plan is to:  -Increase Gaabpentin 200 mg PO TID for withdrawal and pain -Continue Pristiq 50 mg PO Daily for mood  stability -Continue Vistaril  50 mg PO TID PRN for anxiety -Continue Propranolol 10 mg PO TID for anxiety  -Continue Trazodone 100 mg PO QHS PRN for insomnia -Encourage group therapy participation   Maryfrances Bunnell, FNP 07/10/2017, 11:02 AM

## 2017-07-10 NOTE — [facility] Group Notes (Signed)
Type of Therapy and Topic:  Group Therapy: Relapse Prevention and Recovery Participation Level:  Active   Description of Group:    In this group patients will be encouraged to explore what they see as obstacles to their own wellness and recovery. They will be guided to discuss their thoughts, feelings, and behaviors related to these obstacles. The group will process together ways to cope with barriers, with attention given to specific choices patients can make. Each patient will be challenged to identify changes they are motivated to make in order to overcome their obstacles. This group will be process-oriented, with patients participating in exploration of their own experiences as well as giving and receiving support and challenge from other group members.   Therapeutic Goals: 1. Patient will identify personal and current obstacles as they relate to admission. 2. Patient will identify barriers that currently interfere with their wellness or overcoming obstacles.  3. Patient will identify feelings, thought process and behaviors related to these barriers. 4. Patient will identify two changes they are willing to make to overcome these obstacles:      Summary of Patient Progress  Stayed the entire time. Kayla Holden was able to identify many triggers such as help problems, stress and people. She says that her risk factors includes her doctor being an enabler to her with pain medications. She was able to take responsibility for her actions as she did not have to take the medications that were prescribed if she did not need them. She reports her support system consist of her family, therapist and her puppy. She says that she plans are to dump her pills that she has at home after she leaves the hospital as well as changing her mindset to her stay focused on recovery. Mood good, grounded in reality.   Therapeutic Modalities:   Cognitive Behavioral Therapy Solution Focused Therapy Motivational  Interviewing Relapse Prevention Therapy    Kayla ShearsCassandra Fredricka Kohrs MSW, [facility] CenterCSWA 07/10/2017 3:45 PM

## 2017-07-10 NOTE — Progress Notes (Signed)
  [facility] :  Will you be returning to the same living situation after discharge:  Yes,  return home At discharge, do you have transportation home?: Yes,  sister Do you have the ability to pay for your medications: Yes,  no concerns expressed, insured  Release of information consent forms completed and in the chart;  Patient's signature needed at discharge.  Patient to Follow up at: Follow-up Information    [facility] on 07/13/2017.   Why:  Therapy appointment on 11.26 at 2 PM.  Please ask to be referred for medications management, office was closed on 11/23 so CSW could not make referral. Contact information: [address] [place] [id number] [phone]           Next level [facility] care provider [facility]no  Safety Planning and Suicide Prevention discussed: Yes,  w father  Have you used any form of tobacco in the last 30 days? (Cigarettes, Smokeless Tobacco, Cigars, and/or Pipes): No  Has patient been referred to the Quitline?: N/A patient is not a smoker  Patient has been referred for addiction treatment: Yes outpatient provider can assess and refer as appropriate  Sallee Langenne C Reyaan Thoma, LCSW 07/10/2017, 3:50 PM

## 2017-07-10 NOTE — Progress Notes (Signed)
DAR NOTE: Patient presents with anxious affect and depressed mood. Pt has been constantly at the nurses station asking to see the nurses, constantly asking for medication and very needy and somatic. Pt requires constant redirection, but pt will not follow. Pt complained of pain and pitting edema on the lower extremities bilateral. Denies auditory and visual hallucinations.  Rates depression at 2, hopelessness at 0, and anxiety at 2.  Maintained on routine safety checks.  Medications given as prescribed.  Support and encouragement offered as needed.  Attended group and participated.  States goal for today is " discharge due to medical concerns."  Patient observed socializing with peers in the dayroom.  Offered no complaint.

## 2017-07-11 MED ORDER — PROPRANOLOL HCL 10 MG PO TABS: 10.0000 mg | ORAL_TABLET | Freq: Three times a day (TID) | ORAL | 0 refills | Status: DC

## 2017-07-11 MED ORDER — TRAZODONE HCL 100 MG PO TABS: 100.0000 mg | ORAL_TABLET | Freq: Every evening | ORAL | 0 refills | Status: DC | PRN

## 2017-07-11 MED ORDER — GABAPENTIN 100 MG PO CAPS: 200.0000 mg | ORAL_CAPSULE | Freq: Three times a day (TID) | ORAL | 0 refills | Status: DC

## 2017-07-11 NOTE — Progress Notes (Signed)
Patient discharged to lobby. Patient was stable and appreciative at that time. All papers and prescriptions were given and valuables returned. Verbal understanding expressed. Denies SI/HI and A/VH. Patient given opportunity to express concerns and ask questions.  

## 2017-07-11 NOTE — Plan of Care (Signed)
  Progressing Safety: Periods of time without injury will increase 07/11/2017 0125 - Progressing by Arrie Aranhurch, Briannie Gutierrez J, RN Note Pt has not harmed self or others tonight.  She denies SI/HI and verbally contracts for safety.

## 2017-07-11 NOTE — [facility] Suicide Risk Assessment (Signed)
[facility]   Principal Problem: MDD (major depressive disorder) Discharge Diagnoses:  Patient Active Problem List   Diagnosis Date Noted  . Pain in joint, ankle and foot [M25.579] 07/09/2017  . MDD (major depressive disorder) [F32.9] 07/06/2017  . Lymphocytic colitis [K52.832] 08/22/2016  . Conversion disorder [F44.9] 06/20/2015  . Chronic daily headache [R51] 05/09/2015    Total Time spent with patient: 30 minutes  Musculoskeletal: Strength & Muscle Tone: within normal limits Gait & Station: normal Patient leans: N/A  Psychiatric Specialty Exam: Review of Systems  Constitutional: Negative.   HENT: Negative.   Eyes: Negative.   Respiratory: Negative.   Cardiovascular: Negative.   Gastrointestinal: Negative.   Genitourinary: Negative.   Musculoskeletal: Negative.   Skin: Negative.   Neurological: Negative.   Endo/Heme/Allergies: Negative.   Psychiatric/Behavioral: Negative for depression, hallucinations, memory loss, substance abuse and suicidal ideas. The patient is not nervous/anxious and does not have insomnia.     Blood pressure (!) 155/107, pulse (!) 110, temperature 98.4 F (36.9 C), temperature source Oral, resp. rate 18, height 5\' 3"  (1.6 m), weight 101.2 kg (223 lb).Body mass index is 39.5 kg/m.  General Appearance: Neatly dressed, pleasant, engaging well and cooperative. Appropriate behavior. Not in any distress. Good relatedness. Not internally stimulated.   Eye Contact::  Good  Speech:  Spontaneous, normal prosody. Normal tone and rate.   Volume:  Normal  Mood:  Euthymic  Affect:  Appropriate and Full Range  Thought Process:  Linear  Orientation:  Full (Time, Place, and Person)  Thought Content:  No delusional theme. No preoccupation with violent thoughts. No negative ruminations. No obsession.  No hallucination in any modality.   Suicidal Thoughts:  No  Homicidal Thoughts:  No  Memory:  Immediate;   Good Recent;   Good Remote;    Good  Judgement:  Good  Insight:  Good  Psychomotor Activity:  Normal  Concentration:  Good  Recall:  Good  Fund of Knowledge:Good  Language: Good  Akathisia:  Negative  Handed:    AIMS (if indicated):     Assets:  Communication Skills Desire for Improvement Housing Resilience  Sleep:  Number of Hours: 6.75  Cognition: WNL  ADL's:  Intact   Clinical Assessment::   36 y.o Caucasian female, single, lives with her dog, unemployed. Background history of MDD Recurrent and multiple medical comorbidity. [facility] Presented there following an overdose. Took 90 pills of valium and then called her father. Reported to have ran out of Clonazepam. She decided to take her left over Valium. Unclear if she wanted to feel better or kill herself at that time as she had reported suicidal thoughts the previous day. Patient was intubated and managed medically before being admitted here .   Chart reviewed today. Patient discussed at team today. Team feels that she is doing well. Patient has been focused on going home today. She has not been reporting any suicidal thoughts. She has not been observed to be internally stimulated. She was reported to have been a bit anxious yesterday. When probed today about it, patient told me that seeing a lot of people go home yesterday made her home sick. Says she was anxious as she missed her dog at that time.  At interview, patient reports being on Clonazepam and Clozapine. Says she was also on opiate medications. Says she was drowsy from the various combination. Tells me that she was not suicidal when she took the medications. Patient is glad  to be alive today. Says she feels good being weaned off most of those medications. Says her family has been visiting her regularly. Patient talked about her medical issues. She elaborated on follow up she has coming and her plans to follow it through. She reports being in good spirits. She is no longer feeling  depressed. Reports normal energy and interest. She has been maintaining normal biological functions. She is able to think clearly. She is able to focus on task. Her thoughts are not crowded or racing. No evidence of mania. No hallucination in any modality. She is not making any delusional statement. No passivity of will/thought. She is fully in touch with reality. No thoughts of suicide. No thoughts of homicide. No violent thoughts. No overwhelming anxiety. No acces to weapons.   I spoke to her father Annette StableBill on [phone]. He reports that they have been visiting her daily. Says they are confident that she is back to her old self. Says they plan to take her to their home as she wants to spend a couple of days with them. Family reports that she was messed up by medications her PCP was giving her. Says they have changed her PCP. Family has no concerns about safety.     Demographic Factors:  Caucasian  Loss Factors: Decline in physical health  Historical Factors: Impulsivity  Risk Reduction Factors:   Sense of responsibility to family, Living with another person, especially a relative, Positive social support, Positive therapeutic relationship and Positive coping skills or problem solving skills  Continued Clinical Symptoms:  As above   Cognitive Features That Contribute To Risk:  None    Suicide Risk:  Minimal: No identifiable suicidal ideation.  Patient is not having any thoughts of suicide at this time. Modifiable risk factors targeted during this admission includes depression, chronic pain and substance use. Demographical and historical risk factors cannot be modified. Patient is now engaging well. Patient is reliable and is future oriented. We have buffered patient's support structures. At this point, patient is at low risk of suicide. Patient is aware of the effects of psychoactive substances on decision making process. Patient has been provided with emergency contacts. Patient  acknowledges to use resources provided if unforseen circumstances changes their current risk stratification.   Follow-up Information    [facility] up on 07/13/2017.   Why:  Therapy appointment on 11.26 at 2 PM.  Please ask to be referred for medications management, office was closed on 11/23 so CSW could not make referral. Contact information: [address] [place] [id number] [phone]           Plan Of Care/Follow-up recommendations:  1. Continue current psychotropic medications 2. Mental health and addiction follow up as arranged.  3. Discharge in care of her family 4. Provided limited quantity of prescriptions   Georgiann CockerVincent A Ercil Cassis, MD 07/11/2017, 10:36 AM

## 2017-07-11 NOTE — Progress Notes (Signed)
D: Pt was in the dayroom upon initial approach.  Pt presents with anxious affect and mood.  Pt has multiple complaints upon initial assessment.  She reports she wants to discharge as soon as possible.  She discusses how she is more anxious and has had a difficult time sleeping.  Pt denies SI/HI, denies hallucinations, reports pain from headache of 9/10.  Pt has bilateral pedal edema.  Pt attended evening group.    A: Introduced self to pt.  Actively listened to pt and offered support and encouragement.  PRN medication administered for anxiety and pain.  On-site provider notified of pt's complaints of difficulty sleeping and repeat Trazodone was ordered and administered, see MAR.  Pt encouraged to elevate her feet and she agrees to do so.  Fall prevention techniques reviewed with pt, pt verbalized understanding.  Q15 minute safety checks maintained.  R: Pt is safe on the unit.  Pt is compliant with medications.  Pt verbally contracts for safety.  Will continue to monitor and assess.

## 2017-07-11 NOTE — [facility] Group Notes (Signed)
[facility]  Date/Time:    07/11/2017 10:00-11:00AM  Type of Therapy and Topic:  Group Therapy:  Healthy vs Unhealthy Coping Skills  Participation Level:  Active   Description of Group:  The focus of this group was to determine what unhealthy coping techniques typically are used by group members and what healthy coping techniques would be helpful in coping with various problems.  In particular we addressed the unhealthy coping techniques typically used on a rainy, gloomy day like today, and what would be a healthier coping method to use. Patients were guided in becoming aware of the differences between healthy and unhealthy coping techniques.   Patients were encouraged to consider the changes they can make in their lives including taking medication, telling their doctor when their medicine is not working, introducing new hobbies, creating a schedule and many more.  Therapeutic Goals 1. Patients learned that coping is what human beings do all day long to deal with various situations in their lives 2. Patients defined and discussed healthy vs unhealthy coping techniques 3. Patients identified their preferred coping techniques and identified whether these were healthy or unhealthy 4. Patients determined 1-2 healthy coping skills they would like to become more familiar with and use more often 5. Patients provided support and ideas to each other  Summary of Patient Progress: During group, patient expressed she was excited about the weather today because she is discharging and plans to go home and decorate for Christmas.  She was intrusive at times, wanted to focus on non-group-related items and had to be redirected, particularly when she led several other group members to start complaining about the new laws about opiate medications for pain.   Therapeutic Modalities Cognitive Behavioral Therapy Motivational Interviewing   Ambrose MantleMareida Grossman-Orr, LCSW 07/11/2017, 12:08 PM

## 2017-07-11 NOTE — Discharge Summary (Signed)
Physician Discharge Summary Note  Patient:  Kayla Holden is an 36 y.o., female MRN:  [id number] DOB:  [birth year] Patient phone:  [phone] (home)  Patient address:   [address] [place] [address],  Total Time spent with patient: 30 minutes  Date of Admission:  07/06/2017 Date of Discharge: 07/11/2017  Reason for Admission:  Per HPI-This is an admission assessment for this 36 year old Caucasian female. Admitted to the [facility] adult unit from the [facility] with complaints of Benzodiazepine overdose that rendered patient unresponsive requiring intubation & central-line placement. She is currently being admitted for mood stabilization treatments.During this assessment, Kayla Holden reports, "My dad took me to the [facility] last Thursday. I was on Clozapine for suicidal thoughts x 2 months. I recently ran out of the Clozapine for few days. I had a left over valium 10 mg, about 38 years old. I took about 10 tablets of them. It did not help my suicidal thoughts. Instead, it made me feel dizzy. It felt like the ceiling & everything in the room where spinning. I called my dad to take me to the hospital. I was diagnosed with Major depression & anxiety about a year ago. I was also screened for bipolar disorder & told it was negative. I have been going to the mood treatment center for my mental health treatment & counseling services. I was at the mood treatment center 2 weeks ago. But, my provider by the name of Milana Obey left the center. I have no one to prescribe my medicines. That was the reason that I ran out of the Clozapine. The over dose incident was not a suicide attempt. I was trying to stop feeling suicidal after I ran out of the Clozapine. I have tried on a lot of medicines for my depression, such as Cymbalta, Pristiq, Klonopin & lithium. The lithium was a disaster. It made me lethargic. The Cymbalta did not work at all. The Pristiq helped & I would like to try it again. My  father does not want me on Klonopin because it is addictive. I do not have drug abuse problems. I used to smoke weed, but, last November, I smoked some weed that were laced with Methamphetamine. It made crazy & I was hospitalized at the old [place]. That was it. I have a lot of medical issues; Chiari malformation, gives me bad headaches which I have been treated with Dilaudid. I also have microscopic colitis which I took Zonisamide. I do get bad reflux issues as well".    Principal Problem: MDD (major depressive disorder) Discharge Diagnoses: Patient Active Problem List   Diagnosis Date Noted  . Pain in joint, ankle and foot [M25.579] 07/09/2017  . MDD (major depressive disorder) [F32.9] 07/06/2017  . Lymphocytic colitis [K52.832] 08/22/2016  . Conversion disorder [F44.9] 06/20/2015  . Chronic daily headache [R51] 05/09/2015    Past Psychiatric History:   Past Medical History:  Past Medical History:  Diagnosis Date  . Anxiety   . Chiari malformation   . Depression   . GERD (gastroesophageal reflux disease)   . Microscopic colitis   . Migraine headache     Past Surgical History:  Procedure Laterality Date  . APPENDECTOMY    . CHOLECYSTECTOMY    . KNEE SURGERY Left    3 times   Family History:  Family History  Problem Relation Age of Onset  . Cancer Maternal Grandmother   . Cancer Paternal Grandfather   . Allergic rhinitis Neg Hx   .  Angioedema Neg Hx   . Asthma Neg Hx   . Atopy Neg Hx   . Eczema Neg Hx   . Urticaria Neg Hx   . Immunodeficiency Neg Hx    Family Psychiatric  History:  Social History:  Social History   Substance and Sexual Activity  Alcohol Use No     Social History   Substance and Sexual Activity  Drug Use No    Social History   Socioeconomic History  . Marital status: Single    Spouse name: None  . Number of children: None  . Years of education: None  . Highest education level: None  Social Needs  . Financial resource strain: None   . Food insecurity - worry: None  . Food insecurity - inability: None  . Transportation needs - medical: None  . Transportation needs - non-medical: None  Occupational History  . None  Tobacco Use  . Smoking status: Never Smoker  . Smokeless tobacco: Never Used  Substance and Sexual Activity  . Alcohol use: No  . Drug use: No  . Sexual activity: None  Other Topics Concern  . None  Social History Narrative  . None    Hospital Course: Kayla Holden was admitted for MDD (major depressive disorder)  and crisis management.  Pt was treated discharged with the medications listed below under Medication List.  Medical problems were identified and treated as needed.  Home medications were restarted as appropriate.  Improvement was monitored by observation and Kayla Holden 's daily report of symptom reduction.  Emotional and mental status was monitored by daily self-inventory reports completed by Kayla Holden and clinical staff.         Kayla Holden was evaluated by the treatment team for stability and plans for continued recovery upon discharge. Kayla Holden 's motivation was an integral factor for scheduling further treatment. Employment, transportation, bed availability, health status, family support, and any pending legal issues were also considered during hospital stay. Pt was offered further treatment options upon discharge including but not limited to Residential, Intensive Outpatient, and Outpatient treatment.  Kayla Holden will follow up with the services as listed below under Follow Up Information.     Upon completion of this admission the patient was both mentally and medically stable for discharge denying suicidal/homicidal ideation, auditory/visual/tactile hallucinations, delusional thoughts and paranoia. Zarrah reports follow-up appointments with therapist on Monday 07/13/2017. Reports she will schedule  a follow-up appt with her PCP [facility].    Kayla Holden responded well to treatment with trazodone, neurontin and Pristiq 50mg  without adverse effects. Pt demonstrated improvement without reported or observed adverse effects to the point of stability appropriate for outpatient management. Pertinent labs include: BMP, for which outpatient follow-up is necessary for lab recheck as mentioned below. Reviewed CBC, CMP, BAL, and UDS; all unremarkable aside from noted exceptions.   Physical Findings: AIMS: Facial and Oral Movements Muscles of Facial Expression: None, normal Lips and Perioral Area: None, normal Jaw: None, normal Tongue: None, normal,Extremity Movements Upper (arms, wrists, hands, fingers): None, normal Lower (legs, knees, ankles, toes): None, normal, Trunk Movements Neck, shoulders, hips: None, normal, Overall Severity Severity of abnormal movements (highest score from questions above): None, normal Incapacitation due to abnormal movements: None, normal Patient's awareness of abnormal movements (rate only patient's report): No Awareness, Dental Status Current problems with teeth and/or dentures?: No Does patient usually wear dentures?: No  CIWA:    COWS:  Musculoskeletal: Strength & Muscle Tone: within normal limits Gait & Station: normal Patient leans: N/A  Psychiatric Specialty Exam: See SRA by MD Physical Exam  Vitals reviewed. Constitutional: She is oriented to person, place, and time. She appears well-developed.  Neurological: She is oriented to person, place, and time.  Psychiatric: She has a normal mood and affect. Her behavior is normal.    Review of Systems  Psychiatric/Behavioral: Negative for depression (stable) and suicidal ideas. The patient is not nervous/anxious (improving ).     Blood pressure (!) 155/107, pulse (!) 110, temperature 98.4 F (36.9 C), temperature source Oral, resp. rate 18, height 5\' 3"  (1.6 m), weight 101.2 kg (223 lb).Body mass index is 39.5 kg/m.   Have  you used any form of tobacco in the last 30 days? (Cigarettes, Smokeless Tobacco, Cigars, and/or Pipes): No  Has this patient used any form of tobacco in the last 30 days? (Cigarettes, Smokeless Tobacco, Cigars, and/or Pipes)  No  Blood Alcohol level:  No results found for: [facility]  Metabolic Disorder Labs:  No results found for: HGBA1C, MPG No results found for: PROLACTIN No results found for: CHOL, TRIG, HDL, CHOLHDL, VLDL, LDLCALC  See Psychiatric Specialty Exam and Suicide Risk Assessment completed by Attending Physician prior to discharge.  Discharge destination:  Home  Is patient on multiple antipsychotic therapies at discharge:  No   Has Patient had three or more failed trials of antipsychotic monotherapy by history:  No  Recommended Plan for Multiple Antipsychotic Therapies: NA  Discharge Instructions    Diet - low sodium heart healthy   Complete by:  As directed    Discharge instructions   Complete by:  As directed    Take all medications as prescribed. Keep all follow-up appointments as scheduled.  Do not consume alcohol or use illegal drugs while on prescription medications. Report any adverse effects from your medications to your primary care provider promptly.  In the event of recurrent symptoms or worsening symptoms, call 911, a crisis hotline, or go to the nearest emergency department for evaluation.   Increase activity slowly   Complete by:  As directed      Allergies as of 07/11/2017      Reactions   Isoniazid Shortness Of Breath   Rifampin Shortness Of Breath   Butalbital-aspirin-caffeine Other (See Comments)   Patient has colitis   Ketoprofen Other (See Comments)   Abdominal pain   Nsaids Other (See Comments)   Patient with colitis    Clindamycin/lincomycin Other (See Comments)   Abdominal pain      Medication List    STOP taking these medications   B-D 3CC LUER-LOK SYR 25GX1" 25G X 1" 3 ML Misc Generic drug:  SYRINGE-NEEDLE (DISP) 3 ML    cholestyramine light 4 g packet Commonly known as:  PREVALITE   clonazePAM 2 MG tablet Commonly known as:  KLONOPIN   cyanocobalamin 1000 MCG/ML injection Commonly known as:  (VITAMIN B-12)   dicyclomine 10 MG capsule Commonly known as:  BENTYL   HYDROmorphone 4 MG tablet Commonly known as:  DILAUDID   hydrOXYzine 25 MG capsule Commonly known as:  VISTARIL   ondansetron 8 MG disintegrating tablet Commonly known as:  ZOFRAN-ODT   ranitidine 150 MG tablet Commonly known as:  ZANTAC     TAKE these medications     Indication  budesonide 3 MG 24 hr capsule Commonly known as:  ENTOCORT EC Take 9 mg by mouth every morning.  Indication:  Crohn's Disease  desvenlafaxine 50 MG 24 hr tablet Commonly known as:  PRISTIQ Take 50 mg by mouth daily.  Indication:  Major Depressive Disorder   gabapentin 100 MG capsule Commonly known as:  NEURONTIN Take 2 capsules (200 mg total) by mouth 3 (three) times daily.  Indication:  Agitation, Anxiety   propranolol 10 MG tablet Commonly known as:  INDERAL Take 1 tablet (10 mg total) by mouth 3 (three) times daily. What changed:    medication strength  how much to take  when to take this  Indication:  High Blood Pressure Disorder, Anxiety   traZODone 100 MG tablet Commonly known as:  DESYREL Take 1 tablet (100 mg total) by mouth at bedtime as needed and may repeat dose one time if needed for sleep.  Indication:  Trouble Sleeping      Follow-up Information    [facility] Follow up on 07/13/2017.   Why:  Therapy appointment on 11.26 at 2 PM.  Please ask to be referred for medications management, office was closed on 11/23 so CSW could not make referral. Contact information: [address] [place] [address] [phone]           Follow-up recommendations:  Activity:  as tolerated Diet:  heart healthy  Comments:  Take all medications as prescribed. Keep all follow-up appointments as scheduled.  Do  not consume alcohol or use illegal drugs while on prescription medications. Report any adverse effects from your medications to your primary care provider promptly.  In the event of recurrent symptoms or worsening symptoms, call 911, a crisis hotline, or go to the nearest emergency department for evaluation.   Signed: Oneta Rack, NP 07/11/2017, 9:48 AM

## 2017-07-13 DIAGNOSIS — F3340 Major depressive disorder, recurrent, in remission, unspecified: Secondary | ICD-10-CM | POA: Diagnosis not present

## 2017-07-13 DIAGNOSIS — F411 Generalized anxiety disorder: Secondary | ICD-10-CM | POA: Diagnosis not present

## 2017-07-16 DIAGNOSIS — F411 Generalized anxiety disorder: Secondary | ICD-10-CM | POA: Diagnosis not present

## 2017-07-16 DIAGNOSIS — F3340 Major depressive disorder, recurrent, in remission, unspecified: Secondary | ICD-10-CM | POA: Diagnosis not present

## 2017-07-17 DIAGNOSIS — F3340 Major depressive disorder, recurrent, in remission, unspecified: Secondary | ICD-10-CM | POA: Diagnosis not present

## 2017-07-17 DIAGNOSIS — F411 Generalized anxiety disorder: Secondary | ICD-10-CM | POA: Diagnosis not present

## 2017-07-20 DIAGNOSIS — F411 Generalized anxiety disorder: Secondary | ICD-10-CM | POA: Diagnosis not present

## 2017-07-20 DIAGNOSIS — F3340 Major depressive disorder, recurrent, in remission, unspecified: Secondary | ICD-10-CM | POA: Diagnosis not present

## 2017-07-24 DIAGNOSIS — F3340 Major depressive disorder, recurrent, in remission, unspecified: Secondary | ICD-10-CM | POA: Diagnosis not present

## 2017-07-24 DIAGNOSIS — F411 Generalized anxiety disorder: Secondary | ICD-10-CM | POA: Diagnosis not present

## 2017-08-03 DIAGNOSIS — F411 Generalized anxiety disorder: Secondary | ICD-10-CM | POA: Diagnosis not present

## 2017-08-03 DIAGNOSIS — F3340 Major depressive disorder, recurrent, in remission, unspecified: Secondary | ICD-10-CM | POA: Diagnosis not present

## 2017-08-04 DIAGNOSIS — F411 Generalized anxiety disorder: Secondary | ICD-10-CM | POA: Diagnosis not present

## 2017-08-04 DIAGNOSIS — F3340 Major depressive disorder, recurrent, in remission, unspecified: Secondary | ICD-10-CM | POA: Diagnosis not present

## 2017-08-05 DIAGNOSIS — F329 Major depressive disorder, single episode, unspecified: Secondary | ICD-10-CM | POA: Diagnosis not present

## 2017-08-05 DIAGNOSIS — K52839 Microscopic colitis, unspecified: Secondary | ICD-10-CM | POA: Diagnosis not present

## 2017-08-05 DIAGNOSIS — I2510 Atherosclerotic heart disease of native coronary artery without angina pectoris: Secondary | ICD-10-CM | POA: Diagnosis not present

## 2017-08-05 DIAGNOSIS — Z23 Encounter for immunization: Secondary | ICD-10-CM | POA: Diagnosis not present

## 2017-08-07 DIAGNOSIS — L02621 Furuncle of right foot: Secondary | ICD-10-CM | POA: Diagnosis not present

## 2017-08-07 DIAGNOSIS — M19072 Primary osteoarthritis, left ankle and foot: Secondary | ICD-10-CM | POA: Diagnosis not present

## 2017-08-07 DIAGNOSIS — L03031 Cellulitis of right toe: Secondary | ICD-10-CM | POA: Diagnosis not present

## 2017-08-21 DIAGNOSIS — F3340 Major depressive disorder, recurrent, in remission, unspecified: Secondary | ICD-10-CM | POA: Diagnosis not present

## 2017-08-21 DIAGNOSIS — F411 Generalized anxiety disorder: Secondary | ICD-10-CM | POA: Diagnosis not present

## 2017-08-25 DIAGNOSIS — Z01419 Encounter for gynecological examination (general) (routine) without abnormal findings: Secondary | ICD-10-CM | POA: Diagnosis not present

## 2017-08-25 DIAGNOSIS — Z6839 Body mass index (BMI) 39.0-39.9, adult: Secondary | ICD-10-CM | POA: Diagnosis not present

## 2017-08-25 DIAGNOSIS — Z1339 Encounter for screening examination for other mental health and behavioral disorders: Secondary | ICD-10-CM | POA: Diagnosis not present

## 2017-08-25 DIAGNOSIS — N926 Irregular menstruation, unspecified: Secondary | ICD-10-CM | POA: Diagnosis not present

## 2017-08-28 DIAGNOSIS — F411 Generalized anxiety disorder: Secondary | ICD-10-CM | POA: Diagnosis not present

## 2017-08-28 DIAGNOSIS — F3340 Major depressive disorder, recurrent, in remission, unspecified: Secondary | ICD-10-CM | POA: Diagnosis not present

## 2017-09-04 DIAGNOSIS — F411 Generalized anxiety disorder: Secondary | ICD-10-CM | POA: Diagnosis not present

## 2017-09-04 DIAGNOSIS — F3340 Major depressive disorder, recurrent, in remission, unspecified: Secondary | ICD-10-CM | POA: Diagnosis not present

## 2017-09-08 DIAGNOSIS — F3340 Major depressive disorder, recurrent, in remission, unspecified: Secondary | ICD-10-CM | POA: Diagnosis not present

## 2017-09-08 DIAGNOSIS — F411 Generalized anxiety disorder: Secondary | ICD-10-CM | POA: Diagnosis not present

## 2017-09-09 DIAGNOSIS — R1031 Right lower quadrant pain: Secondary | ICD-10-CM | POA: Diagnosis not present

## 2017-09-09 DIAGNOSIS — R3129 Other microscopic hematuria: Secondary | ICD-10-CM | POA: Diagnosis not present

## 2017-09-14 DIAGNOSIS — F3340 Major depressive disorder, recurrent, in remission, unspecified: Secondary | ICD-10-CM | POA: Diagnosis not present

## 2017-09-15 DIAGNOSIS — F3340 Major depressive disorder, recurrent, in remission, unspecified: Secondary | ICD-10-CM | POA: Diagnosis not present

## 2017-09-16 DIAGNOSIS — R110 Nausea: Secondary | ICD-10-CM | POA: Diagnosis not present

## 2017-09-16 DIAGNOSIS — R109 Unspecified abdominal pain: Secondary | ICD-10-CM | POA: Diagnosis not present

## 2017-09-16 DIAGNOSIS — R3129 Other microscopic hematuria: Secondary | ICD-10-CM | POA: Diagnosis not present

## 2017-09-18 DIAGNOSIS — F3340 Major depressive disorder, recurrent, in remission, unspecified: Secondary | ICD-10-CM | POA: Diagnosis not present

## 2017-09-21 DIAGNOSIS — F3340 Major depressive disorder, recurrent, in remission, unspecified: Secondary | ICD-10-CM | POA: Diagnosis not present

## 2017-09-23 DIAGNOSIS — R3129 Other microscopic hematuria: Secondary | ICD-10-CM | POA: Diagnosis not present

## 2017-09-24 DIAGNOSIS — K52839 Microscopic colitis, unspecified: Secondary | ICD-10-CM | POA: Diagnosis not present

## 2017-09-24 DIAGNOSIS — Z6839 Body mass index (BMI) 39.0-39.9, adult: Secondary | ICD-10-CM | POA: Diagnosis not present

## 2017-09-25 DIAGNOSIS — F3340 Major depressive disorder, recurrent, in remission, unspecified: Secondary | ICD-10-CM | POA: Diagnosis not present

## 2017-10-05 DIAGNOSIS — R112 Nausea with vomiting, unspecified: Secondary | ICD-10-CM | POA: Diagnosis not present

## 2017-10-05 DIAGNOSIS — R1084 Generalized abdominal pain: Secondary | ICD-10-CM | POA: Diagnosis not present

## 2017-10-05 DIAGNOSIS — K760 Fatty (change of) liver, not elsewhere classified: Secondary | ICD-10-CM | POA: Diagnosis not present

## 2017-10-05 DIAGNOSIS — R1012 Left upper quadrant pain: Secondary | ICD-10-CM | POA: Diagnosis not present

## 2017-10-05 DIAGNOSIS — R079 Chest pain, unspecified: Secondary | ICD-10-CM | POA: Diagnosis not present

## 2017-10-12 DIAGNOSIS — F3340 Major depressive disorder, recurrent, in remission, unspecified: Secondary | ICD-10-CM | POA: Diagnosis not present

## 2017-10-14 DIAGNOSIS — I259 Chronic ischemic heart disease, unspecified: Secondary | ICD-10-CM | POA: Diagnosis not present

## 2017-10-14 DIAGNOSIS — M79671 Pain in right foot: Secondary | ICD-10-CM | POA: Diagnosis not present

## 2017-10-14 DIAGNOSIS — R1310 Dysphagia, unspecified: Secondary | ICD-10-CM | POA: Diagnosis not present

## 2017-10-14 DIAGNOSIS — R197 Diarrhea, unspecified: Secondary | ICD-10-CM | POA: Diagnosis not present

## 2017-10-14 DIAGNOSIS — R140 Abdominal distension (gaseous): Secondary | ICD-10-CM | POA: Diagnosis not present

## 2017-10-15 DIAGNOSIS — K30 Functional dyspepsia: Secondary | ICD-10-CM | POA: Diagnosis not present

## 2017-10-15 DIAGNOSIS — K209 Esophagitis, unspecified: Secondary | ICD-10-CM | POA: Diagnosis not present

## 2017-10-15 DIAGNOSIS — F339 Major depressive disorder, recurrent, unspecified: Secondary | ICD-10-CM | POA: Diagnosis not present

## 2017-10-15 DIAGNOSIS — I2510 Atherosclerotic heart disease of native coronary artery without angina pectoris: Secondary | ICD-10-CM | POA: Diagnosis not present

## 2017-10-15 DIAGNOSIS — K208 Other esophagitis: Secondary | ICD-10-CM | POA: Diagnosis not present

## 2017-10-15 DIAGNOSIS — R12 Heartburn: Secondary | ICD-10-CM | POA: Diagnosis not present

## 2017-10-15 DIAGNOSIS — K210 Gastro-esophageal reflux disease with esophagitis: Secondary | ICD-10-CM | POA: Diagnosis not present

## 2017-10-15 DIAGNOSIS — R197 Diarrhea, unspecified: Secondary | ICD-10-CM | POA: Diagnosis not present

## 2017-10-15 DIAGNOSIS — R140 Abdominal distension (gaseous): Secondary | ICD-10-CM | POA: Diagnosis not present

## 2017-10-15 DIAGNOSIS — K219 Gastro-esophageal reflux disease without esophagitis: Secondary | ICD-10-CM | POA: Diagnosis not present

## 2017-10-16 DIAGNOSIS — R140 Abdominal distension (gaseous): Secondary | ICD-10-CM | POA: Diagnosis not present

## 2017-10-16 DIAGNOSIS — I259 Chronic ischemic heart disease, unspecified: Secondary | ICD-10-CM | POA: Diagnosis not present

## 2017-10-16 DIAGNOSIS — I10 Essential (primary) hypertension: Secondary | ICD-10-CM | POA: Diagnosis not present

## 2017-10-16 DIAGNOSIS — I2510 Atherosclerotic heart disease of native coronary artery without angina pectoris: Secondary | ICD-10-CM | POA: Diagnosis not present

## 2017-10-16 DIAGNOSIS — R55 Syncope and collapse: Secondary | ICD-10-CM | POA: Diagnosis not present

## 2017-10-19 DIAGNOSIS — F439 Reaction to severe stress, unspecified: Secondary | ICD-10-CM | POA: Diagnosis not present

## 2017-10-19 DIAGNOSIS — F419 Anxiety disorder, unspecified: Secondary | ICD-10-CM | POA: Diagnosis not present

## 2017-10-19 DIAGNOSIS — F332 Major depressive disorder, recurrent severe without psychotic features: Secondary | ICD-10-CM | POA: Diagnosis not present

## 2017-10-19 DIAGNOSIS — F1211 Cannabis abuse, in remission: Secondary | ICD-10-CM | POA: Diagnosis not present

## 2017-10-20 DIAGNOSIS — R55 Syncope and collapse: Secondary | ICD-10-CM | POA: Diagnosis not present

## 2017-10-22 DIAGNOSIS — R197 Diarrhea, unspecified: Secondary | ICD-10-CM | POA: Diagnosis not present

## 2017-10-26 DIAGNOSIS — M26609 Unspecified temporomandibular joint disorder, unspecified side: Secondary | ICD-10-CM | POA: Diagnosis not present

## 2017-10-26 DIAGNOSIS — Z87891 Personal history of nicotine dependence: Secondary | ICD-10-CM | POA: Diagnosis not present

## 2017-10-26 DIAGNOSIS — E785 Hyperlipidemia, unspecified: Secondary | ICD-10-CM | POA: Diagnosis not present

## 2017-10-26 DIAGNOSIS — I2510 Atherosclerotic heart disease of native coronary artery without angina pectoris: Secondary | ICD-10-CM | POA: Diagnosis not present

## 2017-10-26 DIAGNOSIS — I10 Essential (primary) hypertension: Secondary | ICD-10-CM | POA: Diagnosis not present

## 2017-10-26 DIAGNOSIS — K5090 Crohn's disease, unspecified, without complications: Secondary | ICD-10-CM | POA: Diagnosis not present

## 2017-10-26 DIAGNOSIS — G894 Chronic pain syndrome: Secondary | ICD-10-CM | POA: Diagnosis not present

## 2017-10-26 DIAGNOSIS — K219 Gastro-esophageal reflux disease without esophagitis: Secondary | ICD-10-CM | POA: Diagnosis not present

## 2017-10-26 DIAGNOSIS — F332 Major depressive disorder, recurrent severe without psychotic features: Secondary | ICD-10-CM | POA: Diagnosis not present

## 2017-10-26 DIAGNOSIS — M1990 Unspecified osteoarthritis, unspecified site: Secondary | ICD-10-CM | POA: Diagnosis not present

## 2017-10-26 DIAGNOSIS — F439 Reaction to severe stress, unspecified: Secondary | ICD-10-CM | POA: Diagnosis not present

## 2017-10-26 DIAGNOSIS — R45851 Suicidal ideations: Secondary | ICD-10-CM | POA: Diagnosis not present

## 2017-10-26 DIAGNOSIS — M2141 Flat foot [pes planus] (acquired), right foot: Secondary | ICD-10-CM | POA: Diagnosis not present

## 2017-10-26 DIAGNOSIS — F419 Anxiety disorder, unspecified: Secondary | ICD-10-CM | POA: Diagnosis not present

## 2017-10-26 DIAGNOSIS — Z915 Personal history of self-harm: Secondary | ICD-10-CM | POA: Diagnosis not present

## 2017-10-26 DIAGNOSIS — M25571 Pain in right ankle and joints of right foot: Secondary | ICD-10-CM | POA: Diagnosis not present

## 2017-10-26 DIAGNOSIS — G43909 Migraine, unspecified, not intractable, without status migrainosus: Secondary | ICD-10-CM | POA: Diagnosis not present

## 2017-10-26 DIAGNOSIS — F339 Major depressive disorder, recurrent, unspecified: Secondary | ICD-10-CM | POA: Diagnosis not present

## 2017-10-26 DIAGNOSIS — F329 Major depressive disorder, single episode, unspecified: Secondary | ICD-10-CM | POA: Diagnosis not present

## 2017-10-26 DIAGNOSIS — K589 Irritable bowel syndrome without diarrhea: Secondary | ICD-10-CM | POA: Diagnosis not present

## 2017-10-26 DIAGNOSIS — F1211 Cannabis abuse, in remission: Secondary | ICD-10-CM | POA: Diagnosis not present

## 2017-10-27 DIAGNOSIS — F329 Major depressive disorder, single episode, unspecified: Secondary | ICD-10-CM | POA: Diagnosis not present

## 2017-10-27 DIAGNOSIS — F332 Major depressive disorder, recurrent severe without psychotic features: Secondary | ICD-10-CM | POA: Diagnosis not present

## 2017-10-28 DIAGNOSIS — F332 Major depressive disorder, recurrent severe without psychotic features: Secondary | ICD-10-CM | POA: Diagnosis not present

## 2017-10-29 DIAGNOSIS — F332 Major depressive disorder, recurrent severe without psychotic features: Secondary | ICD-10-CM | POA: Diagnosis not present

## 2017-10-30 DIAGNOSIS — F332 Major depressive disorder, recurrent severe without psychotic features: Secondary | ICD-10-CM | POA: Diagnosis not present

## 2017-11-02 DIAGNOSIS — F332 Major depressive disorder, recurrent severe without psychotic features: Secondary | ICD-10-CM | POA: Diagnosis not present

## 2017-11-04 DIAGNOSIS — M2141 Flat foot [pes planus] (acquired), right foot: Secondary | ICD-10-CM | POA: Diagnosis not present

## 2017-11-04 DIAGNOSIS — M25571 Pain in right ankle and joints of right foot: Secondary | ICD-10-CM | POA: Diagnosis not present

## 2017-11-06 DIAGNOSIS — F332 Major depressive disorder, recurrent severe without psychotic features: Secondary | ICD-10-CM | POA: Diagnosis not present

## 2017-11-09 DIAGNOSIS — F332 Major depressive disorder, recurrent severe without psychotic features: Secondary | ICD-10-CM | POA: Diagnosis not present

## 2017-11-13 DIAGNOSIS — M272 Inflammatory conditions of jaws: Secondary | ICD-10-CM | POA: Diagnosis not present

## 2017-11-13 DIAGNOSIS — R51 Headache: Secondary | ICD-10-CM | POA: Diagnosis not present

## 2017-11-13 DIAGNOSIS — M26609 Unspecified temporomandibular joint disorder, unspecified side: Secondary | ICD-10-CM | POA: Diagnosis not present

## 2017-11-25 DIAGNOSIS — F329 Major depressive disorder, single episode, unspecified: Secondary | ICD-10-CM | POA: Diagnosis not present

## 2017-11-25 DIAGNOSIS — Z6837 Body mass index (BMI) 37.0-37.9, adult: Secondary | ICD-10-CM | POA: Diagnosis not present

## 2017-11-25 DIAGNOSIS — Z23 Encounter for immunization: Secondary | ICD-10-CM | POA: Diagnosis not present

## 2017-11-25 DIAGNOSIS — G935 Compression of brain: Secondary | ICD-10-CM | POA: Diagnosis not present

## 2017-11-25 DIAGNOSIS — I2510 Atherosclerotic heart disease of native coronary artery without angina pectoris: Secondary | ICD-10-CM | POA: Diagnosis not present

## 2017-12-25 DIAGNOSIS — F3340 Major depressive disorder, recurrent, in remission, unspecified: Secondary | ICD-10-CM | POA: Diagnosis not present

## 2017-12-28 DIAGNOSIS — F3340 Major depressive disorder, recurrent, in remission, unspecified: Secondary | ICD-10-CM | POA: Diagnosis not present

## 2018-02-03 DIAGNOSIS — R51 Headache: Secondary | ICD-10-CM | POA: Diagnosis not present

## 2018-02-03 DIAGNOSIS — M26609 Unspecified temporomandibular joint disorder, unspecified side: Secondary | ICD-10-CM | POA: Diagnosis not present

## 2018-02-04 DIAGNOSIS — R51 Headache: Secondary | ICD-10-CM | POA: Diagnosis not present

## 2018-02-04 DIAGNOSIS — M26609 Unspecified temporomandibular joint disorder, unspecified side: Secondary | ICD-10-CM | POA: Diagnosis not present

## 2018-02-05 DIAGNOSIS — F1211 Cannabis abuse, in remission: Secondary | ICD-10-CM | POA: Diagnosis not present

## 2018-02-05 DIAGNOSIS — G4700 Insomnia, unspecified: Secondary | ICD-10-CM | POA: Diagnosis not present

## 2018-02-05 DIAGNOSIS — R45851 Suicidal ideations: Secondary | ICD-10-CM | POA: Diagnosis not present

## 2018-02-05 DIAGNOSIS — I2510 Atherosclerotic heart disease of native coronary artery without angina pectoris: Secondary | ICD-10-CM | POA: Diagnosis not present

## 2018-02-05 DIAGNOSIS — I10 Essential (primary) hypertension: Secondary | ICD-10-CM | POA: Diagnosis not present

## 2018-02-05 DIAGNOSIS — Z87891 Personal history of nicotine dependence: Secondary | ICD-10-CM | POA: Diagnosis not present

## 2018-02-05 DIAGNOSIS — M26621 Arthralgia of right temporomandibular joint: Secondary | ICD-10-CM | POA: Diagnosis not present

## 2018-02-05 DIAGNOSIS — F419 Anxiety disorder, unspecified: Secondary | ICD-10-CM | POA: Diagnosis not present

## 2018-02-05 DIAGNOSIS — Q282 Arteriovenous malformation of cerebral vessels: Secondary | ICD-10-CM | POA: Diagnosis not present

## 2018-02-05 DIAGNOSIS — R110 Nausea: Secondary | ICD-10-CM | POA: Diagnosis not present

## 2018-02-05 DIAGNOSIS — R51 Headache: Secondary | ICD-10-CM | POA: Diagnosis not present

## 2018-02-05 DIAGNOSIS — F332 Major depressive disorder, recurrent severe without psychotic features: Secondary | ICD-10-CM | POA: Diagnosis not present

## 2018-02-05 DIAGNOSIS — F439 Reaction to severe stress, unspecified: Secondary | ICD-10-CM | POA: Diagnosis not present

## 2018-02-06 DIAGNOSIS — R45851 Suicidal ideations: Secondary | ICD-10-CM | POA: Diagnosis not present

## 2018-02-07 DIAGNOSIS — R45851 Suicidal ideations: Secondary | ICD-10-CM | POA: Diagnosis not present

## 2018-02-08 DIAGNOSIS — F332 Major depressive disorder, recurrent severe without psychotic features: Secondary | ICD-10-CM | POA: Diagnosis not present

## 2018-02-08 DIAGNOSIS — F419 Anxiety disorder, unspecified: Secondary | ICD-10-CM | POA: Diagnosis not present

## 2018-02-08 DIAGNOSIS — R45851 Suicidal ideations: Secondary | ICD-10-CM | POA: Diagnosis not present

## 2018-02-09 DIAGNOSIS — F419 Anxiety disorder, unspecified: Secondary | ICD-10-CM | POA: Diagnosis not present

## 2018-02-09 DIAGNOSIS — F332 Major depressive disorder, recurrent severe without psychotic features: Secondary | ICD-10-CM | POA: Diagnosis not present

## 2018-02-10 DIAGNOSIS — R45851 Suicidal ideations: Secondary | ICD-10-CM | POA: Diagnosis not present

## 2018-02-10 DIAGNOSIS — F419 Anxiety disorder, unspecified: Secondary | ICD-10-CM | POA: Diagnosis not present

## 2018-02-10 DIAGNOSIS — F332 Major depressive disorder, recurrent severe without psychotic features: Secondary | ICD-10-CM | POA: Diagnosis not present

## 2018-02-11 DIAGNOSIS — F332 Major depressive disorder, recurrent severe without psychotic features: Secondary | ICD-10-CM | POA: Diagnosis not present

## 2018-02-12 DIAGNOSIS — F332 Major depressive disorder, recurrent severe without psychotic features: Secondary | ICD-10-CM | POA: Diagnosis not present

## 2018-02-12 DIAGNOSIS — R45851 Suicidal ideations: Secondary | ICD-10-CM | POA: Diagnosis not present

## 2018-02-13 DIAGNOSIS — R45851 Suicidal ideations: Secondary | ICD-10-CM | POA: Diagnosis not present

## 2018-02-13 DIAGNOSIS — F332 Major depressive disorder, recurrent severe without psychotic features: Secondary | ICD-10-CM | POA: Diagnosis not present

## 2018-02-13 DIAGNOSIS — F419 Anxiety disorder, unspecified: Secondary | ICD-10-CM | POA: Diagnosis not present

## 2018-02-14 DIAGNOSIS — F332 Major depressive disorder, recurrent severe without psychotic features: Secondary | ICD-10-CM | POA: Diagnosis not present

## 2018-02-14 DIAGNOSIS — F419 Anxiety disorder, unspecified: Secondary | ICD-10-CM | POA: Diagnosis not present

## 2018-02-14 DIAGNOSIS — R45851 Suicidal ideations: Secondary | ICD-10-CM | POA: Diagnosis not present

## 2018-02-15 DIAGNOSIS — F419 Anxiety disorder, unspecified: Secondary | ICD-10-CM | POA: Diagnosis not present

## 2018-02-15 DIAGNOSIS — F332 Major depressive disorder, recurrent severe without psychotic features: Secondary | ICD-10-CM | POA: Diagnosis not present

## 2018-02-15 DIAGNOSIS — R45851 Suicidal ideations: Secondary | ICD-10-CM | POA: Diagnosis not present

## 2018-02-16 DIAGNOSIS — F332 Major depressive disorder, recurrent severe without psychotic features: Secondary | ICD-10-CM | POA: Diagnosis not present

## 2018-02-16 DIAGNOSIS — F419 Anxiety disorder, unspecified: Secondary | ICD-10-CM | POA: Diagnosis not present

## 2018-02-17 DIAGNOSIS — F332 Major depressive disorder, recurrent severe without psychotic features: Secondary | ICD-10-CM | POA: Diagnosis not present

## 2018-02-17 DIAGNOSIS — F419 Anxiety disorder, unspecified: Secondary | ICD-10-CM | POA: Diagnosis not present

## 2018-02-17 DIAGNOSIS — R45851 Suicidal ideations: Secondary | ICD-10-CM | POA: Diagnosis not present

## 2018-02-18 DIAGNOSIS — F332 Major depressive disorder, recurrent severe without psychotic features: Secondary | ICD-10-CM | POA: Diagnosis not present

## 2018-02-19 DIAGNOSIS — F332 Major depressive disorder, recurrent severe without psychotic features: Secondary | ICD-10-CM | POA: Diagnosis not present

## 2018-02-19 DIAGNOSIS — R45851 Suicidal ideations: Secondary | ICD-10-CM | POA: Diagnosis not present

## 2018-02-20 DIAGNOSIS — R110 Nausea: Secondary | ICD-10-CM | POA: Diagnosis not present

## 2018-02-20 DIAGNOSIS — R45851 Suicidal ideations: Secondary | ICD-10-CM | POA: Diagnosis not present

## 2018-02-20 DIAGNOSIS — F419 Anxiety disorder, unspecified: Secondary | ICD-10-CM | POA: Diagnosis not present

## 2018-02-20 DIAGNOSIS — F332 Major depressive disorder, recurrent severe without psychotic features: Secondary | ICD-10-CM | POA: Diagnosis not present

## 2018-02-21 DIAGNOSIS — R45851 Suicidal ideations: Secondary | ICD-10-CM | POA: Diagnosis not present

## 2018-02-21 DIAGNOSIS — F419 Anxiety disorder, unspecified: Secondary | ICD-10-CM | POA: Diagnosis not present

## 2018-02-21 DIAGNOSIS — F332 Major depressive disorder, recurrent severe without psychotic features: Secondary | ICD-10-CM | POA: Diagnosis not present

## 2018-02-21 DIAGNOSIS — R110 Nausea: Secondary | ICD-10-CM | POA: Diagnosis not present

## 2018-02-22 DIAGNOSIS — F332 Major depressive disorder, recurrent severe without psychotic features: Secondary | ICD-10-CM | POA: Diagnosis not present

## 2018-02-23 DIAGNOSIS — F332 Major depressive disorder, recurrent severe without psychotic features: Secondary | ICD-10-CM | POA: Diagnosis not present

## 2018-02-24 DIAGNOSIS — F332 Major depressive disorder, recurrent severe without psychotic features: Secondary | ICD-10-CM | POA: Diagnosis not present

## 2018-02-25 DIAGNOSIS — M26601 Right temporomandibular joint disorder, unspecified: Secondary | ICD-10-CM | POA: Diagnosis not present

## 2018-02-25 DIAGNOSIS — M26621 Arthralgia of right temporomandibular joint: Secondary | ICD-10-CM | POA: Diagnosis not present

## 2018-03-03 DIAGNOSIS — F419 Anxiety disorder, unspecified: Secondary | ICD-10-CM | POA: Diagnosis not present

## 2018-03-03 DIAGNOSIS — F332 Major depressive disorder, recurrent severe without psychotic features: Secondary | ICD-10-CM | POA: Diagnosis not present

## 2018-03-04 DIAGNOSIS — F332 Major depressive disorder, recurrent severe without psychotic features: Secondary | ICD-10-CM | POA: Diagnosis not present

## 2018-03-10 DIAGNOSIS — Z87891 Personal history of nicotine dependence: Secondary | ICD-10-CM | POA: Diagnosis not present

## 2018-03-10 DIAGNOSIS — G8918 Other acute postprocedural pain: Secondary | ICD-10-CM | POA: Diagnosis not present

## 2018-03-10 DIAGNOSIS — M26621 Arthralgia of right temporomandibular joint: Secondary | ICD-10-CM | POA: Diagnosis not present

## 2018-03-10 DIAGNOSIS — Q0700 Arnold-Chiari syndrome without spina bifida or hydrocephalus: Secondary | ICD-10-CM | POA: Diagnosis not present

## 2018-03-15 DIAGNOSIS — Z09 Encounter for follow-up examination after completed treatment for conditions other than malignant neoplasm: Secondary | ICD-10-CM | POA: Diagnosis not present

## 2018-03-15 DIAGNOSIS — M26609 Unspecified temporomandibular joint disorder, unspecified side: Secondary | ICD-10-CM | POA: Diagnosis not present

## 2018-03-24 DIAGNOSIS — L03114 Cellulitis of left upper limb: Secondary | ICD-10-CM | POA: Diagnosis not present

## 2018-03-24 DIAGNOSIS — Z6837 Body mass index (BMI) 37.0-37.9, adult: Secondary | ICD-10-CM | POA: Diagnosis not present

## 2018-03-24 DIAGNOSIS — F329 Major depressive disorder, single episode, unspecified: Secondary | ICD-10-CM | POA: Diagnosis not present

## 2018-04-26 DIAGNOSIS — F3340 Major depressive disorder, recurrent, in remission, unspecified: Secondary | ICD-10-CM | POA: Diagnosis not present

## 2018-05-03 DIAGNOSIS — F3340 Major depressive disorder, recurrent, in remission, unspecified: Secondary | ICD-10-CM | POA: Diagnosis not present

## 2018-05-10 DIAGNOSIS — F3340 Major depressive disorder, recurrent, in remission, unspecified: Secondary | ICD-10-CM | POA: Diagnosis not present

## 2018-05-17 DIAGNOSIS — F3340 Major depressive disorder, recurrent, in remission, unspecified: Secondary | ICD-10-CM | POA: Diagnosis not present

## 2018-05-18 DIAGNOSIS — R1084 Generalized abdominal pain: Secondary | ICD-10-CM | POA: Diagnosis not present

## 2018-05-18 DIAGNOSIS — R1110 Vomiting, unspecified: Secondary | ICD-10-CM | POA: Diagnosis not present

## 2018-05-18 DIAGNOSIS — R51 Headache: Secondary | ICD-10-CM | POA: Diagnosis not present

## 2018-05-19 DIAGNOSIS — R1110 Vomiting, unspecified: Secondary | ICD-10-CM | POA: Diagnosis not present

## 2018-05-19 DIAGNOSIS — R51 Headache: Secondary | ICD-10-CM | POA: Diagnosis not present

## 2018-05-20 DIAGNOSIS — R51 Headache: Secondary | ICD-10-CM | POA: Diagnosis not present

## 2018-05-20 DIAGNOSIS — R1084 Generalized abdominal pain: Secondary | ICD-10-CM | POA: Diagnosis not present

## 2018-05-20 DIAGNOSIS — R112 Nausea with vomiting, unspecified: Secondary | ICD-10-CM | POA: Diagnosis not present

## 2018-05-20 DIAGNOSIS — Z6834 Body mass index (BMI) 34.0-34.9, adult: Secondary | ICD-10-CM | POA: Diagnosis not present

## 2018-06-03 DIAGNOSIS — R197 Diarrhea, unspecified: Secondary | ICD-10-CM | POA: Diagnosis not present

## 2018-06-03 DIAGNOSIS — Z23 Encounter for immunization: Secondary | ICD-10-CM | POA: Diagnosis not present

## 2018-06-03 DIAGNOSIS — E669 Obesity, unspecified: Secondary | ICD-10-CM | POA: Diagnosis not present

## 2018-06-03 DIAGNOSIS — Z6835 Body mass index (BMI) 35.0-35.9, adult: Secondary | ICD-10-CM | POA: Diagnosis not present

## 2018-06-09 DIAGNOSIS — F3340 Major depressive disorder, recurrent, in remission, unspecified: Secondary | ICD-10-CM | POA: Diagnosis not present

## 2018-06-14 DIAGNOSIS — F3340 Major depressive disorder, recurrent, in remission, unspecified: Secondary | ICD-10-CM | POA: Diagnosis not present

## 2018-07-09 DIAGNOSIS — K580 Irritable bowel syndrome with diarrhea: Secondary | ICD-10-CM | POA: Diagnosis not present

## 2018-07-09 DIAGNOSIS — Z6835 Body mass index (BMI) 35.0-35.9, adult: Secondary | ICD-10-CM | POA: Diagnosis not present

## 2018-07-29 DIAGNOSIS — M9901 Segmental and somatic dysfunction of cervical region: Secondary | ICD-10-CM | POA: Diagnosis not present

## 2018-07-29 DIAGNOSIS — M9902 Segmental and somatic dysfunction of thoracic region: Secondary | ICD-10-CM | POA: Diagnosis not present

## 2018-07-29 DIAGNOSIS — M5382 Other specified dorsopathies, cervical region: Secondary | ICD-10-CM | POA: Diagnosis not present

## 2018-07-29 DIAGNOSIS — M62830 Muscle spasm of back: Secondary | ICD-10-CM | POA: Diagnosis not present

## 2018-08-02 DIAGNOSIS — M62830 Muscle spasm of back: Secondary | ICD-10-CM | POA: Diagnosis not present

## 2018-08-02 DIAGNOSIS — M9901 Segmental and somatic dysfunction of cervical region: Secondary | ICD-10-CM | POA: Diagnosis not present

## 2018-08-02 DIAGNOSIS — M5382 Other specified dorsopathies, cervical region: Secondary | ICD-10-CM | POA: Diagnosis not present

## 2018-08-02 DIAGNOSIS — M9902 Segmental and somatic dysfunction of thoracic region: Secondary | ICD-10-CM | POA: Diagnosis not present

## 2018-08-04 DIAGNOSIS — M9902 Segmental and somatic dysfunction of thoracic region: Secondary | ICD-10-CM | POA: Diagnosis not present

## 2018-08-04 DIAGNOSIS — M9901 Segmental and somatic dysfunction of cervical region: Secondary | ICD-10-CM | POA: Diagnosis not present

## 2018-08-04 DIAGNOSIS — M62830 Muscle spasm of back: Secondary | ICD-10-CM | POA: Diagnosis not present

## 2018-08-04 DIAGNOSIS — M5382 Other specified dorsopathies, cervical region: Secondary | ICD-10-CM | POA: Diagnosis not present

## 2018-08-16 DIAGNOSIS — M5382 Other specified dorsopathies, cervical region: Secondary | ICD-10-CM | POA: Diagnosis not present

## 2018-08-16 DIAGNOSIS — M62830 Muscle spasm of back: Secondary | ICD-10-CM | POA: Diagnosis not present

## 2018-08-16 DIAGNOSIS — M9901 Segmental and somatic dysfunction of cervical region: Secondary | ICD-10-CM | POA: Diagnosis not present

## 2018-08-16 DIAGNOSIS — M9902 Segmental and somatic dysfunction of thoracic region: Secondary | ICD-10-CM | POA: Diagnosis not present

## 2018-08-19 DIAGNOSIS — R197 Diarrhea, unspecified: Secondary | ICD-10-CM | POA: Diagnosis not present

## 2018-08-19 DIAGNOSIS — R112 Nausea with vomiting, unspecified: Secondary | ICD-10-CM | POA: Diagnosis not present

## 2018-08-19 DIAGNOSIS — R1012 Left upper quadrant pain: Secondary | ICD-10-CM | POA: Diagnosis not present

## 2018-08-23 DIAGNOSIS — M9903 Segmental and somatic dysfunction of lumbar region: Secondary | ICD-10-CM | POA: Diagnosis not present

## 2018-08-23 DIAGNOSIS — M62830 Muscle spasm of back: Secondary | ICD-10-CM | POA: Diagnosis not present

## 2018-08-23 DIAGNOSIS — M5382 Other specified dorsopathies, cervical region: Secondary | ICD-10-CM | POA: Diagnosis not present

## 2018-08-23 DIAGNOSIS — M9901 Segmental and somatic dysfunction of cervical region: Secondary | ICD-10-CM | POA: Diagnosis not present

## 2018-08-23 DIAGNOSIS — M9902 Segmental and somatic dysfunction of thoracic region: Secondary | ICD-10-CM | POA: Diagnosis not present

## 2018-08-26 DIAGNOSIS — M62830 Muscle spasm of back: Secondary | ICD-10-CM | POA: Diagnosis not present

## 2018-08-26 DIAGNOSIS — M9902 Segmental and somatic dysfunction of thoracic region: Secondary | ICD-10-CM | POA: Diagnosis not present

## 2018-08-26 DIAGNOSIS — M5382 Other specified dorsopathies, cervical region: Secondary | ICD-10-CM | POA: Diagnosis not present

## 2018-08-26 DIAGNOSIS — M9901 Segmental and somatic dysfunction of cervical region: Secondary | ICD-10-CM | POA: Diagnosis not present

## 2018-08-26 DIAGNOSIS — M9903 Segmental and somatic dysfunction of lumbar region: Secondary | ICD-10-CM | POA: Diagnosis not present

## 2018-08-27 DIAGNOSIS — R609 Edema, unspecified: Secondary | ICD-10-CM | POA: Diagnosis not present

## 2018-08-27 DIAGNOSIS — Z6835 Body mass index (BMI) 35.0-35.9, adult: Secondary | ICD-10-CM | POA: Diagnosis not present

## 2018-08-31 DIAGNOSIS — M9903 Segmental and somatic dysfunction of lumbar region: Secondary | ICD-10-CM | POA: Diagnosis not present

## 2018-08-31 DIAGNOSIS — M5382 Other specified dorsopathies, cervical region: Secondary | ICD-10-CM | POA: Diagnosis not present

## 2018-08-31 DIAGNOSIS — M62830 Muscle spasm of back: Secondary | ICD-10-CM | POA: Diagnosis not present

## 2018-08-31 DIAGNOSIS — M9902 Segmental and somatic dysfunction of thoracic region: Secondary | ICD-10-CM | POA: Diagnosis not present

## 2018-08-31 DIAGNOSIS — M9901 Segmental and somatic dysfunction of cervical region: Secondary | ICD-10-CM | POA: Diagnosis not present

## 2018-09-02 DIAGNOSIS — M9901 Segmental and somatic dysfunction of cervical region: Secondary | ICD-10-CM | POA: Diagnosis not present

## 2018-09-02 DIAGNOSIS — M9902 Segmental and somatic dysfunction of thoracic region: Secondary | ICD-10-CM | POA: Diagnosis not present

## 2018-09-02 DIAGNOSIS — M9903 Segmental and somatic dysfunction of lumbar region: Secondary | ICD-10-CM | POA: Diagnosis not present

## 2018-09-02 DIAGNOSIS — M5382 Other specified dorsopathies, cervical region: Secondary | ICD-10-CM | POA: Diagnosis not present

## 2018-09-02 DIAGNOSIS — M62830 Muscle spasm of back: Secondary | ICD-10-CM | POA: Diagnosis not present

## 2018-09-06 DIAGNOSIS — F3340 Major depressive disorder, recurrent, in remission, unspecified: Secondary | ICD-10-CM | POA: Diagnosis not present

## 2018-09-09 DIAGNOSIS — M9901 Segmental and somatic dysfunction of cervical region: Secondary | ICD-10-CM | POA: Diagnosis not present

## 2018-09-09 DIAGNOSIS — M5382 Other specified dorsopathies, cervical region: Secondary | ICD-10-CM | POA: Diagnosis not present

## 2018-09-09 DIAGNOSIS — M9902 Segmental and somatic dysfunction of thoracic region: Secondary | ICD-10-CM | POA: Diagnosis not present

## 2018-09-09 DIAGNOSIS — M9903 Segmental and somatic dysfunction of lumbar region: Secondary | ICD-10-CM | POA: Diagnosis not present

## 2018-09-09 DIAGNOSIS — M62830 Muscle spasm of back: Secondary | ICD-10-CM | POA: Diagnosis not present

## 2018-09-13 DIAGNOSIS — F3340 Major depressive disorder, recurrent, in remission, unspecified: Secondary | ICD-10-CM | POA: Diagnosis not present

## 2018-09-15 DIAGNOSIS — M9901 Segmental and somatic dysfunction of cervical region: Secondary | ICD-10-CM | POA: Diagnosis not present

## 2018-09-15 DIAGNOSIS — M9903 Segmental and somatic dysfunction of lumbar region: Secondary | ICD-10-CM | POA: Diagnosis not present

## 2018-09-15 DIAGNOSIS — M62830 Muscle spasm of back: Secondary | ICD-10-CM | POA: Diagnosis not present

## 2018-09-15 DIAGNOSIS — M9902 Segmental and somatic dysfunction of thoracic region: Secondary | ICD-10-CM | POA: Diagnosis not present

## 2018-09-15 DIAGNOSIS — M5382 Other specified dorsopathies, cervical region: Secondary | ICD-10-CM | POA: Diagnosis not present

## 2018-09-16 DIAGNOSIS — M9903 Segmental and somatic dysfunction of lumbar region: Secondary | ICD-10-CM | POA: Diagnosis not present

## 2018-09-16 DIAGNOSIS — M5382 Other specified dorsopathies, cervical region: Secondary | ICD-10-CM | POA: Diagnosis not present

## 2018-09-16 DIAGNOSIS — M9902 Segmental and somatic dysfunction of thoracic region: Secondary | ICD-10-CM | POA: Diagnosis not present

## 2018-09-16 DIAGNOSIS — M62830 Muscle spasm of back: Secondary | ICD-10-CM | POA: Diagnosis not present

## 2018-09-16 DIAGNOSIS — M9901 Segmental and somatic dysfunction of cervical region: Secondary | ICD-10-CM | POA: Diagnosis not present

## 2018-09-20 DIAGNOSIS — M9901 Segmental and somatic dysfunction of cervical region: Secondary | ICD-10-CM | POA: Diagnosis not present

## 2018-09-20 DIAGNOSIS — M9903 Segmental and somatic dysfunction of lumbar region: Secondary | ICD-10-CM | POA: Diagnosis not present

## 2018-09-20 DIAGNOSIS — M9902 Segmental and somatic dysfunction of thoracic region: Secondary | ICD-10-CM | POA: Diagnosis not present

## 2018-09-20 DIAGNOSIS — M5382 Other specified dorsopathies, cervical region: Secondary | ICD-10-CM | POA: Diagnosis not present

## 2018-09-20 DIAGNOSIS — M62830 Muscle spasm of back: Secondary | ICD-10-CM | POA: Diagnosis not present

## 2018-09-21 DIAGNOSIS — R112 Nausea with vomiting, unspecified: Secondary | ICD-10-CM | POA: Diagnosis not present

## 2018-09-21 DIAGNOSIS — K210 Gastro-esophageal reflux disease with esophagitis: Secondary | ICD-10-CM | POA: Diagnosis not present

## 2018-09-21 DIAGNOSIS — R197 Diarrhea, unspecified: Secondary | ICD-10-CM | POA: Diagnosis not present

## 2018-09-23 DIAGNOSIS — M5382 Other specified dorsopathies, cervical region: Secondary | ICD-10-CM | POA: Diagnosis not present

## 2018-09-23 DIAGNOSIS — M9902 Segmental and somatic dysfunction of thoracic region: Secondary | ICD-10-CM | POA: Diagnosis not present

## 2018-09-23 DIAGNOSIS — M62830 Muscle spasm of back: Secondary | ICD-10-CM | POA: Diagnosis not present

## 2018-09-23 DIAGNOSIS — M9903 Segmental and somatic dysfunction of lumbar region: Secondary | ICD-10-CM | POA: Diagnosis not present

## 2018-09-23 DIAGNOSIS — M9901 Segmental and somatic dysfunction of cervical region: Secondary | ICD-10-CM | POA: Diagnosis not present

## 2018-10-01 DIAGNOSIS — Z6834 Body mass index (BMI) 34.0-34.9, adult: Secondary | ICD-10-CM | POA: Diagnosis not present

## 2018-10-01 DIAGNOSIS — L0291 Cutaneous abscess, unspecified: Secondary | ICD-10-CM | POA: Diagnosis not present

## 2018-10-12 DIAGNOSIS — K52839 Microscopic colitis, unspecified: Secondary | ICD-10-CM | POA: Diagnosis not present

## 2018-10-12 DIAGNOSIS — A0472 Enterocolitis due to Clostridium difficile, not specified as recurrent: Secondary | ICD-10-CM | POA: Diagnosis not present

## 2018-10-12 DIAGNOSIS — K210 Gastro-esophageal reflux disease with esophagitis: Secondary | ICD-10-CM | POA: Diagnosis not present

## 2018-10-19 DIAGNOSIS — F408 Other phobic anxiety disorders: Secondary | ICD-10-CM | POA: Diagnosis not present

## 2018-10-19 DIAGNOSIS — F411 Generalized anxiety disorder: Secondary | ICD-10-CM | POA: Diagnosis not present

## 2018-10-19 DIAGNOSIS — F3340 Major depressive disorder, recurrent, in remission, unspecified: Secondary | ICD-10-CM | POA: Diagnosis not present

## 2018-11-01 DIAGNOSIS — Z1211 Encounter for screening for malignant neoplasm of colon: Secondary | ICD-10-CM | POA: Diagnosis not present

## 2018-11-01 DIAGNOSIS — R197 Diarrhea, unspecified: Secondary | ICD-10-CM | POA: Diagnosis not present

## 2018-11-01 DIAGNOSIS — Z86010 Personal history of colonic polyps: Secondary | ICD-10-CM | POA: Diagnosis not present

## 2018-11-24 DIAGNOSIS — J3089 Other allergic rhinitis: Secondary | ICD-10-CM | POA: Diagnosis not present

## 2018-11-24 DIAGNOSIS — N183 Chronic kidney disease, stage 3 unspecified: Secondary | ICD-10-CM | POA: Diagnosis not present

## 2018-11-24 DIAGNOSIS — R110 Nausea: Secondary | ICD-10-CM | POA: Diagnosis not present

## 2018-11-24 DIAGNOSIS — I2584 Coronary atherosclerosis due to calcified coronary lesion: Secondary | ICD-10-CM | POA: Diagnosis not present

## 2018-11-24 DIAGNOSIS — I10 Essential (primary) hypertension: Secondary | ICD-10-CM | POA: Diagnosis not present

## 2018-11-24 DIAGNOSIS — E1122 Type 2 diabetes mellitus with diabetic chronic kidney disease: Secondary | ICD-10-CM | POA: Diagnosis not present

## 2018-11-24 DIAGNOSIS — I129 Hypertensive chronic kidney disease with stage 1 through stage 4 chronic kidney disease, or unspecified chronic kidney disease: Secondary | ICD-10-CM | POA: Diagnosis not present

## 2018-11-24 DIAGNOSIS — D649 Anemia, unspecified: Secondary | ICD-10-CM | POA: Diagnosis not present

## 2018-11-24 DIAGNOSIS — Z6834 Body mass index (BMI) 34.0-34.9, adult: Secondary | ICD-10-CM | POA: Diagnosis not present

## 2018-11-24 DIAGNOSIS — E119 Type 2 diabetes mellitus without complications: Secondary | ICD-10-CM | POA: Diagnosis not present

## 2018-11-24 DIAGNOSIS — G43909 Migraine, unspecified, not intractable, without status migrainosus: Secondary | ICD-10-CM | POA: Diagnosis not present

## 2018-11-24 DIAGNOSIS — I2510 Atherosclerotic heart disease of native coronary artery without angina pectoris: Secondary | ICD-10-CM | POA: Diagnosis not present

## 2018-11-24 DIAGNOSIS — Z87891 Personal history of nicotine dependence: Secondary | ICD-10-CM | POA: Diagnosis not present

## 2018-11-24 DIAGNOSIS — F419 Anxiety disorder, unspecified: Secondary | ICD-10-CM | POA: Diagnosis not present

## 2018-11-24 DIAGNOSIS — R0609 Other forms of dyspnea: Secondary | ICD-10-CM | POA: Diagnosis not present

## 2018-11-24 DIAGNOSIS — E785 Hyperlipidemia, unspecified: Secondary | ICD-10-CM | POA: Diagnosis not present

## 2018-11-24 DIAGNOSIS — R112 Nausea with vomiting, unspecified: Secondary | ICD-10-CM | POA: Diagnosis not present

## 2018-11-24 DIAGNOSIS — F329 Major depressive disorder, single episode, unspecified: Secondary | ICD-10-CM | POA: Diagnosis not present

## 2018-11-24 DIAGNOSIS — I350 Nonrheumatic aortic (valve) stenosis: Secondary | ICD-10-CM | POA: Diagnosis not present

## 2018-11-24 DIAGNOSIS — Z955 Presence of coronary angioplasty implant and graft: Secondary | ICD-10-CM | POA: Diagnosis not present

## 2018-11-24 DIAGNOSIS — K449 Diaphragmatic hernia without obstruction or gangrene: Secondary | ICD-10-CM | POA: Diagnosis not present

## 2018-11-24 DIAGNOSIS — E669 Obesity, unspecified: Secondary | ICD-10-CM | POA: Diagnosis not present

## 2018-11-24 DIAGNOSIS — Z0000 Encounter for general adult medical examination without abnormal findings: Secondary | ICD-10-CM | POA: Diagnosis not present

## 2018-11-24 DIAGNOSIS — R6889 Other general symptoms and signs: Secondary | ICD-10-CM | POA: Diagnosis not present

## 2018-11-24 DIAGNOSIS — Z20828 Contact with and (suspected) exposure to other viral communicable diseases: Secondary | ICD-10-CM | POA: Diagnosis not present

## 2018-11-24 DIAGNOSIS — S199XXA Unspecified injury of neck, initial encounter: Secondary | ICD-10-CM | POA: Diagnosis not present

## 2018-11-24 DIAGNOSIS — N390 Urinary tract infection, site not specified: Secondary | ICD-10-CM | POA: Diagnosis not present

## 2018-11-24 DIAGNOSIS — N179 Acute kidney failure, unspecified: Secondary | ICD-10-CM | POA: Diagnosis not present

## 2018-11-24 DIAGNOSIS — J301 Allergic rhinitis due to pollen: Secondary | ICD-10-CM | POA: Diagnosis not present

## 2018-11-24 DIAGNOSIS — Z5181 Encounter for therapeutic drug level monitoring: Secondary | ICD-10-CM | POA: Diagnosis not present

## 2018-12-03 DIAGNOSIS — F3340 Major depressive disorder, recurrent, in remission, unspecified: Secondary | ICD-10-CM | POA: Diagnosis not present

## 2018-12-03 DIAGNOSIS — F408 Other phobic anxiety disorders: Secondary | ICD-10-CM | POA: Diagnosis not present

## 2018-12-03 DIAGNOSIS — F411 Generalized anxiety disorder: Secondary | ICD-10-CM | POA: Diagnosis not present

## 2018-12-07 DIAGNOSIS — K580 Irritable bowel syndrome with diarrhea: Secondary | ICD-10-CM | POA: Diagnosis not present

## 2018-12-07 DIAGNOSIS — K210 Gastro-esophageal reflux disease with esophagitis: Secondary | ICD-10-CM | POA: Diagnosis not present

## 2018-12-10 DIAGNOSIS — F408 Other phobic anxiety disorders: Secondary | ICD-10-CM | POA: Diagnosis not present

## 2018-12-10 DIAGNOSIS — F3340 Major depressive disorder, recurrent, in remission, unspecified: Secondary | ICD-10-CM | POA: Diagnosis not present

## 2018-12-10 DIAGNOSIS — F411 Generalized anxiety disorder: Secondary | ICD-10-CM | POA: Diagnosis not present

## 2018-12-31 DIAGNOSIS — F411 Generalized anxiety disorder: Secondary | ICD-10-CM | POA: Diagnosis not present

## 2018-12-31 DIAGNOSIS — F408 Other phobic anxiety disorders: Secondary | ICD-10-CM | POA: Diagnosis not present

## 2018-12-31 DIAGNOSIS — F3340 Major depressive disorder, recurrent, in remission, unspecified: Secondary | ICD-10-CM | POA: Diagnosis not present

## 2019-01-04 DIAGNOSIS — S70352A Superficial foreign body, left thigh, initial encounter: Secondary | ICD-10-CM | POA: Diagnosis not present

## 2019-01-05 DIAGNOSIS — R0781 Pleurodynia: Secondary | ICD-10-CM | POA: Diagnosis not present

## 2019-01-05 DIAGNOSIS — Z6834 Body mass index (BMI) 34.0-34.9, adult: Secondary | ICD-10-CM | POA: Diagnosis not present

## 2019-01-24 DIAGNOSIS — F3340 Major depressive disorder, recurrent, in remission, unspecified: Secondary | ICD-10-CM | POA: Diagnosis not present

## 2019-01-24 DIAGNOSIS — F411 Generalized anxiety disorder: Secondary | ICD-10-CM | POA: Diagnosis not present

## 2019-01-24 DIAGNOSIS — F408 Other phobic anxiety disorders: Secondary | ICD-10-CM | POA: Diagnosis not present

## 2019-01-25 DIAGNOSIS — F408 Other phobic anxiety disorders: Secondary | ICD-10-CM | POA: Diagnosis not present

## 2019-01-25 DIAGNOSIS — F3340 Major depressive disorder, recurrent, in remission, unspecified: Secondary | ICD-10-CM | POA: Diagnosis not present

## 2019-01-25 DIAGNOSIS — F411 Generalized anxiety disorder: Secondary | ICD-10-CM | POA: Diagnosis not present

## 2019-02-07 DIAGNOSIS — F411 Generalized anxiety disorder: Secondary | ICD-10-CM | POA: Diagnosis not present

## 2019-02-07 DIAGNOSIS — F3340 Major depressive disorder, recurrent, in remission, unspecified: Secondary | ICD-10-CM | POA: Diagnosis not present

## 2019-02-07 DIAGNOSIS — F408 Other phobic anxiety disorders: Secondary | ICD-10-CM | POA: Diagnosis not present

## 2019-02-21 DIAGNOSIS — F3340 Major depressive disorder, recurrent, in remission, unspecified: Secondary | ICD-10-CM | POA: Diagnosis not present

## 2019-02-21 DIAGNOSIS — F411 Generalized anxiety disorder: Secondary | ICD-10-CM | POA: Diagnosis not present

## 2019-02-21 DIAGNOSIS — F408 Other phobic anxiety disorders: Secondary | ICD-10-CM | POA: Diagnosis not present

## 2019-03-07 DIAGNOSIS — F408 Other phobic anxiety disorders: Secondary | ICD-10-CM | POA: Diagnosis not present

## 2019-03-07 DIAGNOSIS — F3340 Major depressive disorder, recurrent, in remission, unspecified: Secondary | ICD-10-CM | POA: Diagnosis not present

## 2019-03-07 DIAGNOSIS — L02214 Cutaneous abscess of groin: Secondary | ICD-10-CM | POA: Diagnosis not present

## 2019-03-07 DIAGNOSIS — F411 Generalized anxiety disorder: Secondary | ICD-10-CM | POA: Diagnosis not present

## 2019-03-16 DIAGNOSIS — Z1159 Encounter for screening for other viral diseases: Secondary | ICD-10-CM | POA: Diagnosis not present

## 2019-03-18 DIAGNOSIS — K219 Gastro-esophageal reflux disease without esophagitis: Secondary | ICD-10-CM | POA: Diagnosis not present

## 2019-03-21 DIAGNOSIS — F411 Generalized anxiety disorder: Secondary | ICD-10-CM | POA: Diagnosis not present

## 2019-03-21 DIAGNOSIS — F3340 Major depressive disorder, recurrent, in remission, unspecified: Secondary | ICD-10-CM | POA: Diagnosis not present

## 2019-03-21 DIAGNOSIS — F408 Other phobic anxiety disorders: Secondary | ICD-10-CM | POA: Diagnosis not present

## 2019-04-11 DIAGNOSIS — F411 Generalized anxiety disorder: Secondary | ICD-10-CM | POA: Diagnosis not present

## 2019-04-11 DIAGNOSIS — F3340 Major depressive disorder, recurrent, in remission, unspecified: Secondary | ICD-10-CM | POA: Diagnosis not present

## 2019-04-11 DIAGNOSIS — F408 Other phobic anxiety disorders: Secondary | ICD-10-CM | POA: Diagnosis not present

## 2019-04-14 DIAGNOSIS — F3340 Major depressive disorder, recurrent, in remission, unspecified: Secondary | ICD-10-CM | POA: Diagnosis not present

## 2019-04-14 DIAGNOSIS — F408 Other phobic anxiety disorders: Secondary | ICD-10-CM | POA: Diagnosis not present

## 2019-04-14 DIAGNOSIS — F411 Generalized anxiety disorder: Secondary | ICD-10-CM | POA: Diagnosis not present

## 2019-05-02 DIAGNOSIS — F411 Generalized anxiety disorder: Secondary | ICD-10-CM | POA: Diagnosis not present

## 2019-05-02 DIAGNOSIS — F408 Other phobic anxiety disorders: Secondary | ICD-10-CM | POA: Diagnosis not present

## 2019-05-02 DIAGNOSIS — F3340 Major depressive disorder, recurrent, in remission, unspecified: Secondary | ICD-10-CM | POA: Diagnosis not present

## 2019-05-05 DIAGNOSIS — Z6835 Body mass index (BMI) 35.0-35.9, adult: Secondary | ICD-10-CM | POA: Diagnosis not present

## 2019-05-05 DIAGNOSIS — F329 Major depressive disorder, single episode, unspecified: Secondary | ICD-10-CM | POA: Diagnosis not present

## 2019-05-05 DIAGNOSIS — L0292 Furuncle, unspecified: Secondary | ICD-10-CM | POA: Diagnosis not present

## 2019-05-05 DIAGNOSIS — Z760 Encounter for issue of repeat prescription: Secondary | ICD-10-CM | POA: Diagnosis not present

## 2019-05-18 DIAGNOSIS — F408 Other phobic anxiety disorders: Secondary | ICD-10-CM | POA: Diagnosis not present

## 2019-05-18 DIAGNOSIS — F411 Generalized anxiety disorder: Secondary | ICD-10-CM | POA: Diagnosis not present

## 2019-05-18 DIAGNOSIS — F3340 Major depressive disorder, recurrent, in remission, unspecified: Secondary | ICD-10-CM | POA: Diagnosis not present

## 2019-05-23 DIAGNOSIS — Z01818 Encounter for other preprocedural examination: Secondary | ICD-10-CM | POA: Diagnosis not present

## 2019-05-25 DIAGNOSIS — K219 Gastro-esophageal reflux disease without esophagitis: Secondary | ICD-10-CM | POA: Diagnosis not present

## 2019-05-25 DIAGNOSIS — R109 Unspecified abdominal pain: Secondary | ICD-10-CM | POA: Diagnosis not present

## 2019-05-26 DIAGNOSIS — F408 Other phobic anxiety disorders: Secondary | ICD-10-CM | POA: Diagnosis not present

## 2019-05-26 DIAGNOSIS — F3340 Major depressive disorder, recurrent, in remission, unspecified: Secondary | ICD-10-CM | POA: Diagnosis not present

## 2019-05-26 DIAGNOSIS — F411 Generalized anxiety disorder: Secondary | ICD-10-CM | POA: Diagnosis not present

## 2019-06-09 DIAGNOSIS — K2100 Gastro-esophageal reflux disease with esophagitis, without bleeding: Secondary | ICD-10-CM | POA: Diagnosis not present

## 2019-11-10 DIAGNOSIS — F4310 Post-traumatic stress disorder, unspecified: Secondary | ICD-10-CM | POA: Diagnosis not present

## 2019-11-16 DIAGNOSIS — R197 Diarrhea, unspecified: Secondary | ICD-10-CM | POA: Diagnosis not present

## 2019-11-17 DIAGNOSIS — R1013 Epigastric pain: Secondary | ICD-10-CM | POA: Diagnosis not present

## 2019-11-23 DIAGNOSIS — F4310 Post-traumatic stress disorder, unspecified: Secondary | ICD-10-CM | POA: Diagnosis not present

## 2019-11-28 ENCOUNTER — Encounter: Payer: Self-pay | Admitting: Emergency Medicine

## 2019-11-28 ENCOUNTER — Emergency Department
Admission: EM | Admit: 2019-11-28 | Discharge: 2019-11-29 | Disposition: A | Discharge status: Home / Self Care | Payer: PPO | Attending: Student | Admitting: Student

## 2019-11-28 ENCOUNTER — Other Ambulatory Visit: Payer: Self-pay

## 2019-11-28 DIAGNOSIS — F329 Major depressive disorder, single episode, unspecified: Secondary | ICD-10-CM | POA: Diagnosis not present

## 2019-11-28 DIAGNOSIS — F419 Anxiety disorder, unspecified: Secondary | ICD-10-CM | POA: Insufficient documentation

## 2019-11-28 DIAGNOSIS — K219 Gastro-esophageal reflux disease without esophagitis: Secondary | ICD-10-CM | POA: Insufficient documentation

## 2019-11-28 DIAGNOSIS — F319 Bipolar disorder, unspecified: Secondary | ICD-10-CM | POA: Insufficient documentation

## 2019-11-28 DIAGNOSIS — Z03818 Encounter for observation for suspected exposure to other biological agents ruled out: Secondary | ICD-10-CM | POA: Diagnosis not present

## 2019-11-28 DIAGNOSIS — F418 Other specified anxiety disorders: Secondary | ICD-10-CM | POA: Diagnosis not present

## 2019-11-28 DIAGNOSIS — Z79899 Other long term (current) drug therapy: Secondary | ICD-10-CM | POA: Diagnosis not present

## 2019-11-28 DIAGNOSIS — F32A Depression, unspecified: Secondary | ICD-10-CM

## 2019-11-28 DIAGNOSIS — F332 Major depressive disorder, recurrent severe without psychotic features: Secondary | ICD-10-CM | POA: Diagnosis not present

## 2019-11-28 DIAGNOSIS — R45851 Suicidal ideations: Secondary | ICD-10-CM | POA: Insufficient documentation

## 2019-11-28 DIAGNOSIS — G43909 Migraine, unspecified, not intractable, without status migrainosus: Secondary | ICD-10-CM | POA: Diagnosis not present

## 2019-11-28 DIAGNOSIS — Z20822 Contact with and (suspected) exposure to covid-19: Secondary | ICD-10-CM | POA: Diagnosis not present

## 2019-11-28 HISTORY — DX: Bipolar disorder, unspecified: F31.9

## 2019-11-28 HISTORY — DX: Major depressive disorder, single episode, unspecified: F32.9

## 2019-11-28 LAB — CBC
HCT: 37.2 % (ref 36.0–46.0)
Hemoglobin: 12.7 g/dL (ref 12.0–15.0)
MCH: 27.3 pg (ref 26.0–34.0)
MCHC: 34.1 g/dL (ref 30.0–36.0)
MCV: 80 fL (ref 80.0–100.0)
Platelets: 353 10*3/uL (ref 150–400)
RBC: 4.65 MIL/uL (ref 3.87–5.11)
RDW: 14.4 % (ref 11.5–15.5)
WBC: 9.9 10*3/uL (ref 4.0–10.5)
nRBC: 0 % (ref 0.0–0.2)

## 2019-11-28 LAB — COMPREHENSIVE METABOLIC PANEL
ALT: 20 U/L (ref 0–44)
AST: 24 U/L (ref 15–41)
Albumin: 4.1 g/dL (ref 3.5–5.0)
Alkaline Phosphatase: 76 U/L (ref 38–126)
Anion gap: 13 (ref 5–15)
BUN: 8 mg/dL (ref 6–20)
CO2: 20 mmol/L — ABNORMAL LOW (ref 22–32)
Calcium: 10.1 mg/dL (ref 8.9–10.3)
Chloride: 105 mmol/L (ref 98–111)
Creatinine, Ser: 0.86 mg/dL (ref 0.44–1.00)
GFR calc Af Amer: >60 mL/min (ref >60)
GFR calc non Af Amer: >60 mL/min (ref >60)
Glucose, Bld: 131 mg/dL — ABNORMAL HIGH (ref 70–99)
Potassium: 3.7 mmol/L (ref 3.5–5.1)
Sodium: 138 mmol/L (ref 135–145)
Total Bilirubin: 0.5 mg/dL (ref 0.3–1.2)
Total Protein: 7.7 g/dL (ref 6.5–8.1)

## 2019-11-28 LAB — RESPIRATORY PANEL BY RT PCR (FLU A&B, COVID)
Influenza A by PCR: NEGATIVE
Influenza B by PCR: NEGATIVE
SARS Coronavirus 2 by RT PCR: NEGATIVE

## 2019-11-28 LAB — URINE DRUG SCREEN, QUALITATIVE (ARMC ONLY)
Amphetamines, Ur Screen: NOT DETECTED
Barbiturates, Ur Screen: NOT DETECTED
Benzodiazepine, Ur Scrn: NOT DETECTED
Cannabinoid 50 Ng, Ur ~~LOC~~: POSITIVE — AB
Cocaine Metabolite,Ur ~~LOC~~: NOT DETECTED
MDMA (Ecstasy)Ur Screen: NOT DETECTED
Methadone Scn, Ur: NOT DETECTED
Opiate, Ur Screen: NOT DETECTED
Phencyclidine (PCP) Ur S: NOT DETECTED
Tricyclic, Ur Screen: NOT DETECTED

## 2019-11-28 LAB — SALICYLATE LEVEL: Salicylate Lvl: <7 mg/dL — ABNORMAL LOW (ref 7.0–30.0)

## 2019-11-28 LAB — POCT PREGNANCY, URINE: Preg Test, Ur: NEGATIVE

## 2019-11-28 LAB — ACETAMINOPHEN LEVEL: Acetaminophen (Tylenol), Serum: <10 ug/mL — ABNORMAL LOW (ref 10–30)

## 2019-11-28 LAB — ETHANOL: Alcohol, Ethyl (B): <10 mg/dL (ref <10)

## 2019-11-28 MED ORDER — ZOLPIDEM TARTRATE 5 MG PO TABS
5.0000 mg | ORAL_TABLET | Freq: Every evening | ORAL | Status: DC | PRN
  Administered 2019-11-28: 21:00:00 5 mg via ORAL
  Filled 2019-11-28: qty 1

## 2019-11-28 MED ORDER — PANTOPRAZOLE SODIUM 40 MG PO TBEC: 80.0000 mg | DELAYED_RELEASE_TABLET | Freq: Two times a day (BID) | ORAL | Status: DC

## 2019-11-28 MED ORDER — HYDROXYZINE HCL 25 MG PO TABS
50.0000 mg | ORAL_TABLET | Freq: Four times a day (QID) | ORAL | Status: DC | PRN
  Administered 2019-11-28 – 2019-11-29 (×3): 50 mg via ORAL
  Filled 2019-11-28 (×3): qty 2

## 2019-11-28 MED ORDER — PROPRANOLOL HCL 10 MG PO TABS
10.0000 mg | ORAL_TABLET | Freq: Four times a day (QID) | ORAL | Status: DC | PRN
  Administered 2019-11-29 (×2): 10 mg via ORAL
  Filled 2019-11-28 (×3): qty 1

## 2019-11-28 MED ORDER — PANTOPRAZOLE SODIUM 40 MG PO TBEC
80.0000 mg | DELAYED_RELEASE_TABLET | Freq: Two times a day (BID) | ORAL | Status: DC
  Administered 2019-11-28 – 2019-11-29 (×2): 80 mg via ORAL
  Filled 2019-11-28 (×3): qty 2

## 2019-11-28 MED ORDER — ROSUVASTATIN CALCIUM 10 MG PO TABS
10.0000 mg | ORAL_TABLET | Freq: Every day | ORAL | Status: DC
  Administered 2019-11-29: 10 mg via ORAL
  Filled 2019-11-28 (×3): qty 1

## 2019-11-28 MED ORDER — LORAZEPAM 1 MG PO TABS
1.0000 mg | ORAL_TABLET | Freq: Once | ORAL | Status: AC
  Administered 2019-11-28: 1 mg via ORAL
  Filled 2019-11-28: qty 1

## 2019-11-28 NOTE — ED Notes (Signed)
Psychiatrist speaking with patient. Pt calm.

## 2019-11-28 NOTE — ED Triage Notes (Addendum)
Arrives to ED for evaluation .  Patient anxious and tearful in traige..  Seen by Juleen China this morning and patient c/o feeling suicidal since 2018, after last suicide attempt.  Patient is voluntary.  States feelings have been progressively worsening and the last 6 months has continued to worsen.  Patient states she has been off all antidepressant therapy since October 2020, due to treatment resistance.  Patient has had ECT in the past.

## 2019-11-28 NOTE — ED Notes (Addendum)
Pt dressed out into hospital provided scrubs with the writer and Herbert Seta, Charity fundraiser.   Pt belongings to include:  Chilton Si dress Black flip flops Grey panties  Black bra Green vera bradley purse Black Iphone Keys Red duffle bag Fuzzy blanket  Pt has daith piercing and cartilage piercing that she is unable to get out.

## 2019-11-28 NOTE — ED Notes (Signed)
PT  VOL  PENDING  CONSULT/  PT  MOVED  TO  [facility]
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
## 2019-11-28 NOTE — ED Provider Notes (Signed)
Patient initially on [facility] but is now trying to leave the behavioral health unit stating that she wants to go home and kill herself.  Will place under IVC.   Willy Eddy, MD 11/28/19 Mikle Bosworth

## 2019-11-28 NOTE — Consult Note (Signed)
[facility] Face-to-Face Psychiatry Consult   Reason for Consult:  suicidality Referring Physician: ER Patient Identification: Kayla Holden MRN:  [id number] Principal Diagnosis: <principal problem not specified> Diagnosis:  Active Problems:   MDD (major depressive disorder)   Total Time spent with patient: 30 minutes  Subjective:   Kayla Holden is a 39 y.o. female patient admitted with depression and suicidality  HPI:  Kayla A Colemanis a 39 y.o.femalewith a past medical history anxiety, bipolar, depression, presents to the emergency department for anxiety and depression. Patient states she has been experiencing worsening depression since 2018 with intermittent suicidal thoughts although no active plan at this time. States she feels like her depression and anxiety have been worsening over the past few months. She is engaged in more risk-taking activities such as cocaine and mushroom use 2 weeks ago. States she has been off her antidepressants for the past 6 months. States she has been prescribed ECT in the past which has helped her. She states her prior psychiatrist did not believe an ECT which is one of the reasons why she chose to come to this hospital hoping to be evaluated for possible ECT treatments going forward. Patient denies any acute medical complaints. Patient is quite anxious in appearance. Patient is here currently on a voluntary basis.  Recent suicidality included putting a noose around her neck in an attempt to kill herself. The only thing that stopped her was the sound of her dog. She has also been planning to overdose on pills.   Past Psychiatric History: Past efforts at cutting behavior since the age of 89. Past DBT treatment for borderline personality disorder. She describes multiple medication trials all without success.   Risk to Self: Suicidal Ideation: Yes-Currently Present Suicidal Intent: Yes-Currently Present Is patient at risk for suicide?:  Yes Suicidal Plan?: Yes-Currently Present Specify Current Suicidal Plan: hanging self, over dosing, dricing car into tree at  Access to Means: Yes(Car, any pills on hand ) What has been your use of drugs/alcohol within the last 12 months?: marijauna , cocaine and mushrooms  How many times?: 1 Triggers for Past Attempts: Unknown Intentional Self Injurious Behavior: None Risk to Others: Homicidal Ideation: No Thoughts of Harm to Others: No Current Homicidal Intent: No Current Homicidal Plan: No Access to Homicidal Means: No History of harm to others?: No Assessment of Violence: None Noted Does patient have access to weapons?: No Criminal Charges Pending?: No Does patient have a court date: No Prior Inpatient Therapy: Prior Inpatient [facility]: [facility] ) Prior Therapy Dates: None reported  Prior Therapy Facilty/Provider(s): None reported  Reason for Treatment: unknown  Prior Outpatient Therapy: Prior Outpatient Therapy: Yes Prior Therapy Dates: unknown  Prior Therapy Facilty/Provider(s): unknown  Reason for Treatment: unknown  Does patient have an ACCT team?: No Does patient have Intensive In-House Services?  : No Does patient have [facility] services? : No Does patient have P4CC services?: No  Past Medical History:  Past Medical History:  Diagnosis Date  . Anxiety   . Anxiety   . Bipolar 1 disorder (HCC)   . Chiari malformation   . Depression   . GERD (gastroesophageal reflux disease)   . MDD (major depressive disorder)   . Microscopic colitis   . Migraine headache     Past Surgical History:  Procedure Laterality Date  . APPENDECTOMY    . CHOLECYSTECTOMY    . KNEE SURGERY Left    3 times  . MANDIBLE SURGERY  Family History:  Family History  Problem Relation Age of Onset  . Cancer Maternal Grandmother   . Cancer Paternal Grandfather   . Allergic rhinitis Neg Hx   . Angioedema Neg Hx   . Asthma Neg Hx   . Atopy Neg Hx   . Eczema Neg Hx    . Urticaria Neg Hx   . Immunodeficiency Neg Hx    Family Psychiatric  History:  Social History:  Social History   Substance and Sexual Activity  Alcohol Use No     Social History   Substance and Sexual Activity  Drug Use No    Social History   Socioeconomic History  . Marital status: Single    Spouse name: Not on file  . Number of children: Not on file  . Years of education: Not on file  . Highest education level: Not on file  Occupational History  . Not on file  Tobacco Use  . Smoking status: Never Smoker  . Smokeless tobacco: Never Used  Substance and Sexual Activity  . Alcohol use: No  . Drug use: No  . Sexual activity: Not on file  Other Topics Concern  . Not on file  Social History Narrative  . Not on file   Social Determinants of Health   Financial Resource Strain:   . Difficulty of Paying Living Expenses:   Food Insecurity:   . Worried About Programme researcher, broadcasting/film/video in the Last Year:   . Barista in the Last Year:   Transportation Needs:   . Freight forwarder (Medical):   Marland Kitchen Lack of Transportation (Non-Medical):   Physical Activity:   . Days of Exercise per Week:   . Minutes of Exercise per Session:   Stress:   . Feeling of Stress :   Social Connections:   . Frequency of Communication with Friends and Family:   . Frequency of Social Gatherings with Friends and Family:   . Attends Religious Services:   . Active Member of Clubs or Organizations:   . Attends Banker Meetings:   Marland Kitchen Marital Status:    Additional Social History:    Allergies:   Allergies  Allergen Reactions  . Isoniazid Shortness Of Breath  . Rifampin Shortness Of Breath  . Butalbital-Aspirin-Caffeine Other (See Comments)    Patient has colitis  . Ketoprofen Other (See Comments)    Abdominal pain  . Nsaids Other (See Comments)    Patient with colitis   . Clindamycin/Lincomycin Other (See Comments)    Abdominal pain    Labs:  Results for orders placed  or performed during the hospital encounter of 11/28/19 (from the past 48 hour(s))  Comprehensive metabolic panel     Status: Abnormal   Collection Time: 11/28/19  2:20 PM  Result Value Ref Range   Sodium 138 135 - 145 mmol/L   Potassium 3.7 3.5 - 5.1 mmol/L   Chloride 105 98 - 111 mmol/L   CO2 20 (L) 22 - 32 mmol/L   Glucose, Bld 131 (H) 70 - 99 mg/dL    Comment: Glucose reference range applies only to samples taken after fasting for at least 8 hours.   BUN 8 6 - 20 mg/dL   Creatinine, Ser 4.09 0.44 - 1.00 mg/dL   Calcium 81.1 8.9 - 91.4 mg/dL   Total Protein 7.7 6.5 - 8.1 g/dL   Albumin 4.1 3.5 - 5.0 g/dL   AST 24 15 - 41 U/L   ALT 20 0 -  44 U/L   Alkaline Phosphatase 76 38 - 126 U/L   Total Bilirubin 0.5 0.3 - 1.2 mg/dL   GFR calc non Af Amer >60 >60 mL/min   GFR calc Af Amer >60 >60 mL/min   Anion gap 13 5 - 15    Comment: Performed at [facility], [address] [place], [place] [address]  Ethanol     Status: None   Collection Time: 11/28/19  2:20 PM  Result Value Ref Range   Alcohol, Ethyl (B) <10 <10 mg/dL    Comment: (NOTE) Lowest detectable limit for serum alcohol is 10 mg/dL. For medical purposes only. Performed at [facility], [address] [place], [place] [address]   Salicylate level     Status: Abnormal   Collection Time: 11/28/19  2:20 PM  Result Value Ref Range   Salicylate Lvl <7.0 (L) 7.0 - 30.0 mg/dL    Comment: Performed at [facility], [address] [place], [place] [address]  Acetaminophen level     Status: Abnormal   Collection Time: 11/28/19  2:20 PM  Result Value Ref Range   Acetaminophen (Tylenol), Serum <10 (L) 10 - 30 ug/mL    Comment: (NOTE) Therapeutic concentrations vary significantly. A range of 10-30 ug/mL  may be an effective concentration for many patients. However, some  are best treated at concentrations outside of this range. Acetaminophen concentrations >150 ug/mL at 4 hours after ingestion  and  >50 ug/mL at 12 hours after ingestion are often associated with  toxic reactions. Performed at [facility], [address] [place], [place] [address]   cbc     Status: None   Collection Time: 11/28/19  2:20 PM  Result Value Ref Range   WBC 9.9 4.0 - 10.5 K/uL   RBC 4.65 3.87 - 5.11 MIL/uL   Hemoglobin 12.7 12.0 - 15.0 g/dL   HCT 29.537.2 62.136.0 - 30.846.0 %   MCV 80.0 80.0 - 100.0 fL   MCH 27.3 26.0 - 34.0 pg   MCHC 34.1 30.0 - 36.0 g/dL   RDW 65.714.4 84.611.5 - 96.215.5 %   Platelets 353 150 - 400 K/uL   nRBC 0.0 0.0 - 0.2 %    Comment: Performed at [facility], [address] [place], [place] [address]  Urine Drug Screen, Qualitative     Status: Abnormal   Collection Time: 11/28/19  2:20 PM  Result Value Ref Range   Tricyclic, Ur Screen NONE DETECTED NONE DETECTED   Amphetamines, Ur Screen NONE DETECTED NONE DETECTED   MDMA (Ecstasy)Ur Screen NONE DETECTED NONE DETECTED   Cocaine Metabolite,Ur [place] NONE DETECTED NONE DETECTED   Opiate, Ur Screen NONE DETECTED NONE DETECTED   Phencyclidine (PCP) Ur S NONE DETECTED NONE DETECTED   Cannabinoid 50 Ng, Ur [place] POSITIVE (A) NONE DETECTED   Barbiturates, Ur Screen NONE DETECTED NONE DETECTED   Benzodiazepine, Ur Scrn NONE DETECTED NONE DETECTED   Methadone Scn, Ur NONE DETECTED NONE DETECTED    Comment: (NOTE) Tricyclics + metabolites, urine    Cutoff 1000 ng/mL Amphetamines + metabolites, urine  Cutoff 1000 ng/mL MDMA (Ecstasy), urine              Cutoff 500 ng/mL Cocaine Metabolite, urine          Cutoff 300 ng/mL Opiate + metabolites, urine        Cutoff 300 ng/mL Phencyclidine (PCP), urine         Cutoff 25 ng/mL Cannabinoid, urine  Cutoff 50 ng/mL Barbiturates + metabolites, urine  Cutoff 200 ng/mL Benzodiazepine, urine              Cutoff 200 ng/mL Methadone, urine                   Cutoff 300 ng/mL The urine drug screen provides only a preliminary, unconfirmed analytical test result and should not be used  for non-medical purposes. Clinical consideration and professional judgment should be applied to any positive drug screen result due to possible interfering substances. A more specific alternate chemical method must be used in order to obtain a confirmed analytical result. Gas chromatography / mass spectrometry (GC/MS) is the preferred confirmat ory method. Performed at [facility], [address] [place], [place] [address]   Pregnancy, urine POC     Status: None   Collection Time: 11/28/19  2:23 PM  Result Value Ref Range   Preg Test, Ur NEGATIVE NEGATIVE    Comment:        THE SENSITIVITY OF THIS METHODOLOGY IS >24 mIU/mL   Respiratory Panel by RT PCR (Flu A&B, Covid) - Nasopharyngeal Swab     Status: None   Collection Time: 11/28/19  3:43 PM   Specimen: Nasopharyngeal Swab  Result Value Ref Range   SARS Coronavirus 2 by RT PCR NEGATIVE NEGATIVE    Comment: (NOTE) SARS-CoV-2 target nucleic acids are NOT DETECTED. The SARS-CoV-2 RNA is generally detectable in upper respiratoy specimens during the acute phase of infection. The lowest concentration of SARS-CoV-2 viral copies this assay can detect is 131 copies/mL. A negative result does not preclude SARS-Cov-2 infection and should not be used as the sole basis for treatment or other patient management decisions. A negative result may occur with  improper specimen collection/handling, submission of specimen other than nasopharyngeal swab, presence of viral mutation(s) within the areas targeted by this assay, and inadequate number of viral copies (<131 copies/mL). A negative result must be combined with clinical observations, patient history, and epidemiological information. The expected result is Negative. Fact Sheet for Patients:  https://www.moore.com/ Fact Sheet for Healthcare Providers:  https://www.young.biz/ This test is not yet ap proved or cleared by the [place] FDA  and  has been authorized for detection and/or diagnosis of SARS-CoV-2 by FDA under an Emergency Use Authorization (EUA). This EUA will remain  in effect (meaning this test can be used) for the duration of the COVID-19 declaration under Section 564(b)(1) of the Act, 21 U.S.C. section 360bbb-3(b)(1), unless the authorization is terminated or revoked sooner.    Influenza A by PCR NEGATIVE NEGATIVE   Influenza B by PCR NEGATIVE NEGATIVE    Comment: (NOTE) The Xpert Xpress SARS-CoV-2/FLU/RSV assay is intended as an aid in  the diagnosis of influenza from Nasopharyngeal swab specimens and  should not be used as a sole basis for treatment. Nasal washings and  aspirates are unacceptable for Xpert Xpress SARS-CoV-2/FLU/RSV  testing. Fact Sheet for Patients: https://www.moore.com/ Fact Sheet for Healthcare Providers: https://www.young.biz/ This test is not yet approved or cleared by the [place] FDA and  has been authorized for detection and/or diagnosis of SARS-CoV-2 by  FDA under an Emergency Use Authorization (EUA). This EUA will remain  in effect (meaning this test can be used) for the duration of the  Covid-19 declaration under Section 564(b)(1) of the Act, 21  U.S.C. section 360bbb-3(b)(1), unless the authorization is  terminated or revoked. Performed at [facility], [address] [place], [place] [address]  Current Facility-Administered Medications  Medication Dose Route Frequency Provider Last Rate Last Admin  . pantoprazole (PROTONIX) EC tablet 80 mg  80 mg Oral BID Harvest Dark, MD       Current Outpatient Medications  Medication Sig Dispense Refill  . desvenlafaxine (PRISTIQ) 50 MG 24 hr tablet Take 50 mg by mouth daily.    Marland Kitchen gabapentin (NEURONTIN) 100 MG capsule Take 2 capsules (200 mg total) by mouth 3 (three) times daily. 180 capsule 0  . pantoprazole (PROTONIX) 40 MG tablet Take 80 mg by mouth 2 (two) times  daily.    . propranolol (INDERAL) 10 MG tablet Take 1 tablet (10 mg total) by mouth 3 (three) times daily. 90 tablet 0  . traZODone (DESYREL) 100 MG tablet Take 1 tablet (100 mg total) by mouth at bedtime as needed and may repeat dose one time if needed for sleep. 30 tablet 0     Psychiatric Specialty Exam: Physical Exam  Review of Systems  Blood pressure (!) 194/105, pulse 81, temperature 98.3 F (36.8 C), temperature source Oral, resp. rate 16, height 5\' 3"  (1.6 m), weight 101.2 kg, last menstrual period 11/14/2019, SpO2 98 %.Body mass index is 39.52 kg/m.  General Appearance: Fairly Groomed  Eye Contact:  Good  Speech:  Normal Rate  Volume:  Normal  Mood:  Depressed  Affect:  Appropriate  Thought Process:  Goal Directed  Orientation:  Full (Time, Place, and Person)  Thought Content:  WDL  Suicidal Thoughts:  Yes.  with intent/plan  Homicidal Thoughts:  No  Memory:  NA  Judgement:  Poor  Insight:  Lacking  Psychomotor Activity:  increased  Concentration:  Concentration: Good  Recall:  Good  Fund of Knowledge:  Good  Language:  Good  Akathisia:  No  Handed:  Ambidextrous  AIMS (if indicated):     Assets:  Social Support  ADL's:  Intact  Cognition: nl  Sleep:   l    Dx: MDD vs. Bipolar. Likely Borderline Personality Disorder based on patient history and prior diagnosis,  Treatment Plan Summary: Follow suicidality and assess social supports to establish a safe discharge plan.  Disposition: Reassess in am Alesia Morin, MD 11/28/2019 6:23 PM

## 2019-11-28 NOTE — ED Notes (Signed)
Pt upset there is no bed available in the [facility] tonight. "I called ahead and was told there were beds."   RN told the pt the psychiatrist wants her to stay the night to be re-assessed in the morning.  Pt stated, "I would rather just go home. Ill just go home and kill myself."

## 2019-11-28 NOTE — ED Provider Notes (Addendum)
[facility] Emergency Department Provider Note  Time seen: 2:48 PM  I have reviewed the triage vital signs and the nursing notes.   HISTORY  Chief Complaint Suicidal   HPI Kayla Holden is a 39 y.o. female with a past medical history anxiety, bipolar, depression, presents to the emergency department for anxiety and depression.  Patient states she has been experiencing worsening depression since 2018 with intermittent suicidal thoughts although no active plan at this time.  States she feels like her depression and anxiety have been worsening over the past few months.  She is engaged in more risk-taking activities such as cocaine and mushroom use 2 weeks ago.  States she has been off her antidepressants for the past 6 months.  States she has been prescribed ECT in the past which has helped her.  She states her prior psychiatrist did not believe an ECT which is one of the reasons why she chose to come to this hospital hoping to be evaluated for possible ECT treatments going forward.  Patient denies any acute medical complaints.  Patient is quite anxious in appearance.  Patient is here currently on a voluntary basis.   Past Medical History:  Diagnosis Date  . Anxiety   . Anxiety   . Bipolar 1 disorder (HCC)   . Chiari malformation   . Depression   . GERD (gastroesophageal reflux disease)   . MDD (major depressive disorder)   . Microscopic colitis   . Migraine headache     Patient Active Problem List   Diagnosis Date Noted  . Pain in joint, ankle and foot 07/09/2017  . MDD (major depressive disorder) 07/06/2017  . Lymphocytic colitis 08/22/2016  . Conversion disorder 06/20/2015  . Chronic daily headache 05/09/2015    Past Surgical History:  Procedure Laterality Date  . APPENDECTOMY    . CHOLECYSTECTOMY    . KNEE SURGERY Left    3 times  . MANDIBLE SURGERY      Prior to Admission medications   Medication Sig Start Date End Date Taking? Authorizing  Provider  budesonide (ENTOCORT EC) 3 MG 24 hr capsule Take 9 mg by mouth every morning. 06/03/16   [provider]  desvenlafaxine (PRISTIQ) 50 MG 24 hr tablet Take 50 mg by mouth daily.    [provider]  gabapentin (NEURONTIN) 100 MG capsule Take 2 capsules (200 mg total) by mouth 3 (three) times daily. 07/11/17   Oneta Rack, NP  propranolol (INDERAL) 10 MG tablet Take 1 tablet (10 mg total) by mouth 3 (three) times daily. 07/11/17   Oneta Rack, NP  traZODone (DESYREL) 100 MG tablet Take 1 tablet (100 mg total) by mouth at bedtime as needed and may repeat dose one time if needed for sleep. 07/11/17   Oneta Rack, NP    Allergies  Allergen Reactions  . Isoniazid Shortness Of Breath  . Rifampin Shortness Of Breath  . Butalbital-Aspirin-Caffeine Other (See Comments)    Patient has colitis  . Ketoprofen Other (See Comments)    Abdominal pain  . Nsaids Other (See Comments)    Patient with colitis   . Clindamycin/Lincomycin Other (See Comments)    Abdominal pain    Family History  Problem Relation Age of Onset  . Cancer Maternal Grandmother   . Cancer Paternal Grandfather   . Allergic rhinitis Neg Hx   . Angioedema Neg Hx   . Asthma Neg Hx   . Atopy Neg Hx   . Eczema  Neg Hx   . Urticaria Neg Hx   . Immunodeficiency Neg Hx     Social History Social History   Tobacco Use  . Smoking status: Never Smoker  . Smokeless tobacco: Never Used  Substance Use Topics  . Alcohol use: No  . Drug use: No    Review of Systems Constitutional: Negative for fever. Cardiovascular: Negative for chest pain. Respiratory: Negative for shortness of breath. Gastrointestinal: Negative for abdominal pain Genitourinary: Last menstrual period 2 weeks ago. Musculoskeletal: Negative for musculoskeletal complaints Neurological: Negative for headache All other ROS negative  ____________________________________________   PHYSICAL EXAM:  VITAL SIGNS: ED Triage  Vitals  Enc Vitals Group     BP 11/28/19 1414 (!) 194/105     Pulse Rate 11/28/19 1414 81     Resp 11/28/19 1414 16     Temp 11/28/19 1414 98.3 F (36.8 C)     Temp Source 11/28/19 1414 Oral     SpO2 11/28/19 1414 98 %     Weight 11/28/19 1407 223 lb 1.7 oz (101.2 kg)     Height 11/28/19 1407 5\' 3"  (1.6 m)     Head Circumference --      Peak Flow --      Pain Score 11/28/19 1407 0     Pain Loc --      Pain Edu? --      Excl. in [place]? --    Constitutional: Alert and oriented. Well appearing and in no distress. Eyes: Normal exam ENT      Head: Normocephalic and atraumatic.      Mouth/Throat: Mucous membranes are moist. Cardiovascular: Normal rate, regular rhythm. Respiratory: Normal respiratory effort without tachypnea nor retractions. Breath sounds are clear  Gastrointestinal: Soft and nontender. No distention.   Musculoskeletal: Nontender with normal range of motion in all extremities. Neurologic:  Normal speech and language. No gross focal neurologic deficits Skin:  Skin is warm, dry and intact.  Psychiatric: Patient states depression.  Mildly anxious in appearance.  No suicidal plans.  ____________________________________________    INITIAL IMPRESSION / ASSESSMENT AND PLAN / ED COURSE  Pertinent labs & imaging results that were available during my care of the patient were reviewed by me and considered in my medical decision making (see chart for details).   Patient presents emergency department for worsening depression and anxiety.  Patient signed a therapist today admitted recent cocaine in mushroom use in addition to worsening anxiety and depression was referred to the emergency department for further evaluation.  Patient states intermittent suicidal feelings for the past several years but denies any acute worsening of that today.  Denies any active suicidal plans.  Patient has no acute medical concerns.  We will continue to closely monitor in the emergency department.   Patient will remain voluntary at this time and she does not appear to meet IVC criteria currently.  We will check labs and consult TTS and psychiatry.  Kayla Holden was evaluated in Emergency Department on 11/28/2019 for the symptoms described in the history of present illness. She was evaluated in the context of the global COVID-19 pandemic, which necessitated consideration that the patient might be at risk for infection with the SARS-CoV-2 virus that causes COVID-19. Institutional protocols and algorithms that pertain to the evaluation of patients at risk for COVID-19 are in a state of rapid change based on information released by regulatory bodies including the CDC and federal and state organizations. These policies and algorithms were followed during the patient's  care in the ED.  The patient has been placed in psychiatric observation due to the need to provide a safe environment for the patient while obtaining psychiatric consultation and evaluation, as well as ongoing medical and medication management to treat the patient's condition.  The patient has not been placed under full IVC at this time.  ____________________________________________   FINAL CLINICAL IMPRESSION(S) / ED DIAGNOSES  Depression Anxiety   Minna Antis, MD 11/28/19 1453    Minna Antis, MD 11/28/19 1453

## 2019-11-28 NOTE — ED Notes (Signed)
PT  VOL  PENDING  CONSULT/  PT  MOVED  TO  [facility] 

## 2019-11-29 MED ORDER — SUMATRIPTAN SUCCINATE 50 MG PO TABS
25.0000 mg | ORAL_TABLET | Freq: Once | ORAL | Status: AC | PRN
  Administered 2019-11-29: 10:00:00 25 mg via ORAL
  Filled 2019-11-29: qty 1

## 2019-11-29 MED ORDER — ACETAMINOPHEN 325 MG PO TABS
650.0000 mg | ORAL_TABLET | Freq: Once | ORAL | Status: AC
  Administered 2019-11-29: 03:00:00 650 mg via ORAL
  Filled 2019-11-29: qty 2

## 2019-11-29 MED ORDER — LORAZEPAM 1 MG PO TABS
1.0000 mg | ORAL_TABLET | Freq: Once | ORAL | Status: AC
  Administered 2019-11-29: 1 mg via ORAL
  Filled 2019-11-29: qty 1

## 2019-11-29 MED ORDER — ONDANSETRON 4 MG PO TBDP
4.0000 mg | ORAL_TABLET | Freq: Once | ORAL | Status: AC
  Administered 2019-11-29: 10:00:00 4 mg via ORAL
  Filled 2019-11-29 (×2): qty 1

## 2019-11-29 MED ORDER — ACETAMINOPHEN 325 MG PO TABS
650.0000 mg | ORAL_TABLET | Freq: Once | ORAL | Status: AC
  Administered 2019-11-29: 09:00:00 650 mg via ORAL
  Filled 2019-11-29: qty 2

## 2019-11-29 NOTE — ED Provider Notes (Signed)
1:35 PM Patient has been seen and evaluated by psychiatry team and deemed appropriate for discharge. Patient determined not to be a threat to themselves or others. IVC rescinded by psychiatry team. Patient is otherwise medically clear and stable for discharge.    Miguel Aschoff., MD 11/29/19 1336

## 2019-11-29 NOTE — ED Provider Notes (Signed)
Emergency Medicine Observation Re-evaluation Note  Kayla Holden is a 39 y.o. female, seen on rounds today.  Pt initially presented to the ED for complaints of Suicidal Currently, the patient is resting in no acute distress.  Physical Exam  BP (!) 194/105 (BP Location: Left Arm)   Pulse 81   Temp 98.3 F (36.8 C) (Oral)   Resp 16   Ht 5\' 3"  (1.6 m)   Wt 101.2 kg   LMP 11/14/2019   SpO2 98%   BMI 39.52 kg/m  Physical Exam  ED Course / MDM  EKG:    I have reviewed the labs performed to date as well as medications administered while in observation.  No events overnight.   Plan  Current plan is for psychiatric disposition. Patient is under full IVC at this time.   11/16/2019, MD 11/29/19 [phone]

## 2019-11-29 NOTE — ED Notes (Signed)
Patient refused breakfast 

## 2019-11-29 NOTE — ED Notes (Signed)
IVC  PAPERS  RESCINDED  INFORMED  RN  AMY  B

## 2019-11-29 NOTE — ED Notes (Addendum)
Pt given clothing to dress for discharged. Pt is agitated and states she is ready to leave.  Pt worried her father will be waiting on her.

## 2019-11-29 NOTE — ED Notes (Signed)
Pt discharged home. Refused VS.  Discharge instructions given to patient.  Patient signed hard copy for discharge. All patient belongings and paperwork returned to patient.

## 2019-11-29 NOTE — ED Notes (Signed)
Pt approached nursing station and stated that she couldn't sleep and had a headache. Dr Dolores Frame notified and placed orders for tylenol and ativan.

## 2019-11-29 NOTE — Discharge Instructions (Signed)
Follow-up with your regular doctor and psychiatrist.  Continue taking your medications as prescribed.  Return to the ER for new or recurrent thoughts of suicide, any other thoughts of wanting to hurt yourself or anyone else, or any other new or worsening symptoms that concern you.  

## 2019-11-29 NOTE — ED Notes (Signed)
Pt given medications requested, but was not happy with the ordered doses.  Patient unhappy with phone liminatations in place on the unit.  Patient stated, "This is the worst care I have ever received."

## 2019-11-29 NOTE — Consult Note (Signed)
[facility] Face-to-Face Psychiatry Consult   Reason for Consult:  suicidality Referring Physician: ER Patient Identification: Kayla Holden MRN:  [id number] Principal Diagnosis: <principal problem not specified> Diagnosis:  Active Problems:   MDD (major depressive disorder)   Total Time spent with patient: 15 minutes  Subjective:   Kayla Holden is a 39 y.o. female patient admitted with depression and suicidality. Since the prior evaluation she has denied any ongoing suicidal thoughts or intent. No homicidal thoughts or intent to harm herself or others. She has a positive outlook based on a desire to reseek treatment at the [facility] clinic where she received ECT in the past. She talked with her father and he was going to come and pick her up and help her.  HPI:  Kayla Stallworth Colemanis a 39 y.o.femalewith a past medical history anxiety, bipolar, depression, presents to the emergency department for anxiety and depression. Patient states she has been experiencing worsening depression since 2018 with intermittent suicidal thoughts although no active plan at this time. States she feels like her depression and anxiety have been worsening over the past few months. She is engaged in more risk-taking activities such as cocaine and mushroom use 2 weeks ago. States she has been off her antidepressants for the past 6 months. States she has been prescribed ECT in the past which has helped her. She states her prior psychiatrist did not believe an ECT which is one of the reasons why she chose to come to this hospital hoping to be evaluated for possible ECT treatments going forward. Patient denies any acute medical complaints. Patient is quite anxious in appearance. Patient is here currently on a voluntary basis.  Recent suicidality included putting a noose around her neck in an attempt to kill herself. The only thing that stopped her was the sound of her dog. She has also been planning to overdose on pills.    Past Psychiatric History: Past efforts at cutting behavior since the age of 12. Past DBT treatment for borderline personality disorder. She describes multiple medication trials all without success.   Risk to Self: Suicidal Ideation: Yes-Currently Present Suicidal Intent: Yes-Currently Present Is patient at risk for suicide?: Yes Suicidal Plan?: Yes-Currently Present Specify Current Suicidal Plan: hanging self, over dosing, dricing car into tree at  Access to Means: Yes(Car, any pills on hand ) What has been your use of drugs/alcohol within the last 12 months?: marijauna , cocaine and mushrooms  How many times?: 1 Triggers for Past Attempts: Unknown Intentional Self Injurious Behavior: None Risk to Others: Homicidal Ideation: No Thoughts of Harm to Others: No Current Homicidal Intent: No Current Homicidal Plan: No Access to Homicidal Means: No History of harm to others?: No Assessment of Violence: None Noted Does patient have access to weapons?: No Criminal Charges Pending?: No Does patient have a court date: No Prior Inpatient Therapy: Prior Inpatient [facility]: [facility] ) Prior Therapy Dates: None reported  Prior Therapy Facilty/Provider(s): None reported  Reason for Treatment: unknown  Prior Outpatient Therapy: Prior Outpatient Therapy: Yes Prior Therapy Dates: unknown  Prior Therapy Facilty/Provider(s): unknown  Reason for Treatment: unknown  Does patient have an ACCT team?: No Does patient have Intensive In-House Services?  : No Does patient have [facility] services? : No Does patient have P4CC services?: No  Past Medical History:  Past Medical History:  Diagnosis Date  . Anxiety   . Anxiety   . Bipolar 1 disorder (HCC)   . Chiari malformation   .  Depression   . GERD (gastroesophageal reflux disease)   . MDD (major depressive disorder)   . Microscopic colitis   . Migraine headache     Past Surgical History:  Procedure Laterality Date  .  APPENDECTOMY    . CHOLECYSTECTOMY    . KNEE SURGERY Left    3 times  . MANDIBLE SURGERY     Family History:  Family History  Problem Relation Age of Onset  . Cancer Maternal Grandmother   . Cancer Paternal Grandfather   . Allergic rhinitis Neg Hx   . Angioedema Neg Hx   . Asthma Neg Hx   . Atopy Neg Hx   . Eczema Neg Hx   . Urticaria Neg Hx   . Immunodeficiency Neg Hx    Family Psychiatric  History:  Social History:  Social History   Substance and Sexual Activity  Alcohol Use No     Social History   Substance and Sexual Activity  Drug Use No    Social History   Socioeconomic History  . Marital status: Single    Spouse name: Not on file  . Number of children: Not on file  . Years of education: Not on file  . Highest education level: Not on file  Occupational History  . Not on file  Tobacco Use  . Smoking status: Never Smoker  . Smokeless tobacco: Never Used  Substance and Sexual Activity  . Alcohol use: No  . Drug use: No  . Sexual activity: Not on file  Other Topics Concern  . Not on file  Social History Narrative  . Not on file   Social Determinants of Health   Financial Resource Strain:   . Difficulty of Paying Living Expenses:   Food Insecurity:   . Worried About Programme researcher, broadcasting/film/video in the Last Year:   . Barista in the Last Year:   Transportation Needs:   . Freight forwarder (Medical):   Marland Kitchen Lack of Transportation (Non-Medical):   Physical Activity:   . Days of Exercise per Week:   . Minutes of Exercise per Session:   Stress:   . Feeling of Stress :   Social Connections:   . Frequency of Communication with Friends and Family:   . Frequency of Social Gatherings with Friends and Family:   . Attends Religious Services:   . Active Member of Clubs or Organizations:   . Attends Banker Meetings:   Marland Kitchen Marital Status:    Additional Social History:    Allergies:   Allergies  Allergen Reactions  . Isoniazid Shortness Of  Breath  . Rifampin Shortness Of Breath  . Butalbital-Aspirin-Caffeine Other (See Comments)    Patient has colitis  . Ketoprofen Other (See Comments)    Abdominal pain  . Nsaids Other (See Comments)    Patient with colitis   . Clindamycin/Lincomycin Other (See Comments)    Abdominal pain    Labs:  Results for orders placed or performed during the hospital encounter of 11/28/19 (from the past 48 hour(s))  Comprehensive metabolic panel     Status: Abnormal   Collection Time: 11/28/19  2:20 PM  Result Value Ref Range   Sodium 138 135 - 145 mmol/L   Potassium 3.7 3.5 - 5.1 mmol/L   Chloride 105 98 - 111 mmol/L   CO2 20 (L) 22 - 32 mmol/L   Glucose, Bld 131 (H) 70 - 99 mg/dL    Comment: Glucose reference range applies only to  samples taken after fasting for at least 8 hours.   BUN 8 6 - 20 mg/dL   Creatinine, Ser 5.64 0.44 - 1.00 mg/dL   Calcium 33.2 8.9 - 95.1 mg/dL   Total Protein 7.7 6.5 - 8.1 g/dL   Albumin 4.1 3.5 - 5.0 g/dL   AST 24 15 - 41 U/L   ALT 20 0 - 44 U/L   Alkaline Phosphatase 76 38 - 126 U/L   Total Bilirubin 0.5 0.3 - 1.2 mg/dL   GFR calc non Af Amer >60 >60 mL/min   GFR calc Af Amer >60 >60 mL/min   Anion gap 13 5 - 15    Comment: Performed at [facility], [address], [place], [place] [address]  Ethanol     Status: None   Collection Time: 11/28/19  2:20 PM  Result Value Ref Range   Alcohol, Ethyl (B) <10 <10 mg/dL    Comment: (NOTE) Lowest detectable limit for serum alcohol is 10 mg/dL. For medical purposes only. Performed at [facility], [address] [place], [place] [address]   Salicylate level     Status: Abnormal   Collection Time: 11/28/19  2:20 PM  Result Value Ref Range   Salicylate Lvl <7.0 (L) 7.0 - 30.0 mg/dL    Comment: Performed at [facility], [address] [place], [place] [address]  Acetaminophen level     Status: Abnormal   Collection Time: 11/28/19  2:20 PM  Result Value Ref Range    Acetaminophen (Tylenol), Serum <10 (L) 10 - 30 ug/mL    Comment: (NOTE) Therapeutic concentrations vary significantly. A range of 10-30 ug/mL  may be an effective concentration for many patients. However, some  are best treated at concentrations outside of this range. Acetaminophen concentrations >150 ug/mL at 4 hours after ingestion  and >50 ug/mL at 12 hours after ingestion are often associated with  toxic reactions. Performed at [facility], [address] [place], [place] [address]   cbc     Status: None   Collection Time: 11/28/19  2:20 PM  Result Value Ref Range   WBC 9.9 4.0 - 10.5 K/uL   RBC 4.65 3.87 - 5.11 MIL/uL   Hemoglobin 12.7 12.0 - 15.0 g/dL   HCT 57.3 22.0 - 25.4 %   MCV 80.0 80.0 - 100.0 fL   MCH 27.3 26.0 - 34.0 pg   MCHC 34.1 30.0 - 36.0 g/dL   RDW 27.0 62.3 - 76.2 %   Platelets 353 150 - 400 K/uL   nRBC 0.0 0.0 - 0.2 %    Comment: Performed at [facility], [address], [place], [place] [address]  Urine Drug Screen, Qualitative     Status: Abnormal   Collection Time: 11/28/19  2:20 PM  Result Value Ref Range   Tricyclic, Ur Screen NONE DETECTED NONE DETECTED   Amphetamines, Ur Screen NONE DETECTED NONE DETECTED   MDMA (Ecstasy)Ur Screen NONE DETECTED NONE DETECTED   Cocaine Metabolite,Ur [place] NONE DETECTED NONE DETECTED   Opiate, Ur Screen NONE DETECTED NONE DETECTED   Phencyclidine (PCP) Ur S NONE DETECTED NONE DETECTED   Cannabinoid 50 Ng, Ur [place] POSITIVE (A) NONE DETECTED   Barbiturates, Ur Screen NONE DETECTED NONE DETECTED   Benzodiazepine, Ur Scrn NONE DETECTED NONE DETECTED   Methadone Scn, Ur NONE DETECTED NONE DETECTED    Comment: (NOTE) Tricyclics + metabolites, urine    Cutoff 1000 ng/mL Amphetamines + metabolites, urine  Cutoff 1000 ng/mL  MDMA (Ecstasy), urine              Cutoff 500 ng/mL Cocaine Metabolite, urine          Cutoff 300 ng/mL Opiate + metabolites, urine        Cutoff 300 ng/mL Phencyclidine (PCP), urine          Cutoff 25 ng/mL Cannabinoid, urine                 Cutoff 50 ng/mL Barbiturates + metabolites, urine  Cutoff 200 ng/mL Benzodiazepine, urine              Cutoff 200 ng/mL Methadone, urine                   Cutoff 300 ng/mL The urine drug screen provides only a preliminary, unconfirmed analytical test result and should not be used for non-medical purposes. Clinical consideration and professional judgment should be applied to any positive drug screen result due to possible interfering substances. A more specific alternate chemical method must be used in order to obtain a confirmed analytical result. Gas chromatography / mass spectrometry (GC/MS) is the preferred confirmat ory method. Performed at [facility], [address] [place], [place] [address]   Pregnancy, urine POC     Status: None   Collection Time: 11/28/19  2:23 PM  Result Value Ref Range   Preg Test, Ur NEGATIVE NEGATIVE    Comment:        THE SENSITIVITY OF THIS METHODOLOGY IS >24 mIU/mL   Respiratory Panel by RT PCR (Flu A&B, Covid) - Nasopharyngeal Swab     Status: None   Collection Time: 11/28/19  3:43 PM   Specimen: Nasopharyngeal Swab  Result Value Ref Range   SARS Coronavirus 2 by RT PCR NEGATIVE NEGATIVE    Comment: (NOTE) SARS-CoV-2 target nucleic acids are NOT DETECTED. The SARS-CoV-2 RNA is generally detectable in upper respiratoy specimens during the acute phase of infection. The lowest concentration of SARS-CoV-2 viral copies this assay can detect is 131 copies/mL. A negative result does not preclude SARS-Cov-2 infection and should not be used as the sole basis for treatment or other patient management decisions. A negative result may occur with  improper specimen collection/handling, submission of specimen other than nasopharyngeal swab, presence of viral mutation(s) within the areas targeted by this assay, and inadequate number of viral copies (<131 copies/mL). A negative result must  be combined with clinical observations, patient history, and epidemiological information. The expected result is Negative. Fact Sheet for Patients:  PinkCheek.be Fact Sheet for Healthcare Providers:  GravelBags.it This test is not yet ap proved or cleared by the [place] FDA and  has been authorized for detection and/or diagnosis of SARS-CoV-2 by FDA under an Emergency Use Authorization (EUA). This EUA will remain  in effect (meaning this test can be used) for the duration of the COVID-19 declaration under Section 564(b)(1) of the Act, 21 U.S.C. section 360bbb-3(b)(1), unless the authorization is terminated or revoked sooner.    Influenza A by PCR NEGATIVE NEGATIVE   Influenza B by PCR NEGATIVE NEGATIVE    Comment: (NOTE) The Xpert Xpress SARS-CoV-2/FLU/RSV assay is intended as an aid in  the diagnosis of influenza from Nasopharyngeal swab specimens and  should not be used as a sole basis for treatment. Nasal washings and  aspirates are unacceptable for Xpert Xpress SARS-CoV-2/FLU/RSV  testing. Fact Sheet for Patients: PinkCheek.be Fact Sheet for Healthcare Providers: GravelBags.it This test is not yet approved  or cleared by the [place] and  has been authorized for detection and/or diagnosis of SARS-CoV-2 by  FDA under an Emergency Use Authorization (EUA). This EUA will remain  in effect (meaning this test can be used) for the duration of the  Covid-19 declaration under Section 564(b)(1) of the Act, 21  U.S.C. section 360bbb-3(b)(1), unless the authorization is  terminated or revoked. Performed at [facility], [address] [place], [place] [address]     Current Facility-Administered Medications  Medication Dose Route Frequency Provider Last Rate Last Admin  . hydrOXYzine (ATARAX/VISTARIL) tablet 50 mg  50 mg Oral Q6H PRN Willy Eddy, MD   50 mg at 11/29/19 1314  . pantoprazole (PROTONIX) EC tablet 80 mg  80 mg Oral BID Minna Antis, MD   80 mg at 11/29/19 0645  . propranolol (INDERAL) tablet 10 mg  10 mg Oral QID PRN Willy Eddy, MD   10 mg at 11/29/19 1314  . rosuvastatin (CRESTOR) tablet 10 mg  10 mg Oral QHS Willy Eddy, MD   10 mg at 11/29/19 0651  . zolpidem (AMBIEN) tablet 5 mg  5 mg Oral QHS PRN Willy Eddy, MD   5 mg at 11/28/19 2125   Current Outpatient Medications  Medication Sig Dispense Refill  . drospirenone-ethinyl estradiol (YAZ) 3-0.02 MG tablet Take 1 tablet by mouth daily.    . hydrOXYzine (VISTARIL) 50 MG capsule Take 50 mg by mouth every 6 (six) hours as needed for anxiety.    . ondansetron (ZOFRAN) 4 MG tablet Take 4 mg by mouth every 8 (eight) hours as needed for nausea/vomiting.    . pantoprazole (PROTONIX) 40 MG tablet Take 80 mg by mouth 2 (two) times daily.    . promethazine (PHENERGAN) 25 MG tablet Take 25 mg by mouth every 6 (six) hours as needed for nausea/vomiting.    . propranolol (INDERAL) 10 MG tablet Take 10 mg by mouth 4 (four) times daily as needed for heart rate control.    . rosuvastatin (CRESTOR) 10 MG tablet Take 10 mg by mouth at bedtime.    Marland Kitchen zolpidem (AMBIEN) 5 MG tablet Take 5 mg by mouth at bedtime as needed for sleep.       Psychiatric Specialty Exam: Physical Exam  Review of Systems  Blood pressure (!) 194/105, pulse 81, temperature 98.3 F (36.8 C), temperature source Oral, resp. rate 16, height  (1.6 m), weight 101.2 kg, last menstrual period 11/14/2019, SpO2 98 %.Body mass index is 39.52 kg/m.  General Appearance: Fairly Groomed  Eye Contact:  Good  Speech:  Normal Rate  Volume:  Normal  Mood:  Euthymic  Affect:  Appropriate  Thought Process:  Goal Directed  Orientation:  Full (Time, Place, and Person)  Thought Content:  WDL  Suicidal Thoughts:  None  Homicidal Thoughts:  None  Memory:  NA  Judgement:  Good  Insight:   good  Psychomotor Activity:  normal  Concentration:  Concentration: Good  Recall:  Good  Fund of Knowledge:  Good  Language:  Good  Akathisia:  No  Handed:  Ambidextrous  AIMS (if indicated):     Assets:  Social Support  ADL's:  Intact  Cognition: nl  Sleep:   l    Dx: MDD vs. Bipolar. Likely Borderline Personality Disorder based on patient history and prior diagnosis,  Treatment Plan Summary: Pt has an excellent plan to return to a treatment strategy that has historically worked well for her. She is positive  about the plan and optimistic. She denies any suicidality. She conversed calmly with her father and it appears he is supportive of the plan as well.  Given her rapid improvement and optimism, I feel that the least restrictive care environment is as an outpatient on a voluntary basis. I see no need to pursue an outpatient commitment as she was in treatment and agrees to continue treatment.  Disposition: D/C from ED. She will arrange care at a prior provider.  Cindy HazyBradley Dru , MD 11/29/2019 2:10 PM

## 2019-11-29 NOTE — ED Notes (Signed)
Drink given to patient at this time.

## 2019-11-29 NOTE — BH Assessment (Signed)
TTS and psych assessment completed. Pt presented oriented x 3. Pt denies SI/HI or psychosis and expressed preference to leave today. Pt confirmed safety and agreed to follow up with services already in place.   Per Cindy Hazy, MD Pt will be discharged with the recommendation to follow up with resources in place.

## 2019-12-13 DIAGNOSIS — K2100 Gastro-esophageal reflux disease with esophagitis, without bleeding: Secondary | ICD-10-CM | POA: Diagnosis not present

## 2019-12-13 DIAGNOSIS — K580 Irritable bowel syndrome with diarrhea: Secondary | ICD-10-CM | POA: Diagnosis not present

## 2019-12-16 DIAGNOSIS — F4310 Post-traumatic stress disorder, unspecified: Secondary | ICD-10-CM | POA: Diagnosis not present

## 2019-12-19 DIAGNOSIS — Z6834 Body mass index (BMI) 34.0-34.9, adult: Secondary | ICD-10-CM | POA: Diagnosis not present

## 2019-12-19 DIAGNOSIS — F329 Major depressive disorder, single episode, unspecified: Secondary | ICD-10-CM | POA: Diagnosis not present

## 2019-12-19 DIAGNOSIS — F419 Anxiety disorder, unspecified: Secondary | ICD-10-CM | POA: Diagnosis not present

## 2019-12-23 DIAGNOSIS — Z202 Contact with and (suspected) exposure to infections with a predominantly sexual mode of transmission: Secondary | ICD-10-CM | POA: Diagnosis not present

## 2019-12-23 DIAGNOSIS — N898 Other specified noninflammatory disorders of vagina: Secondary | ICD-10-CM | POA: Diagnosis not present

## 2019-12-23 DIAGNOSIS — F4310 Post-traumatic stress disorder, unspecified: Secondary | ICD-10-CM | POA: Diagnosis not present

## 2019-12-23 DIAGNOSIS — S3993XA Unspecified injury of pelvis, initial encounter: Secondary | ICD-10-CM | POA: Diagnosis not present

## 2019-12-26 DIAGNOSIS — Z01419 Encounter for gynecological examination (general) (routine) without abnormal findings: Secondary | ICD-10-CM | POA: Diagnosis not present

## 2019-12-30 DIAGNOSIS — R87610 Atypical squamous cells of undetermined significance on cytologic smear of cervix (ASC-US): Secondary | ICD-10-CM | POA: Diagnosis not present

## 2019-12-30 DIAGNOSIS — N72 Inflammatory disease of cervix uteri: Secondary | ICD-10-CM | POA: Diagnosis not present

## 2020-01-06 DIAGNOSIS — F4310 Post-traumatic stress disorder, unspecified: Secondary | ICD-10-CM | POA: Diagnosis not present

## 2020-01-13 DIAGNOSIS — F4310 Post-traumatic stress disorder, unspecified: Secondary | ICD-10-CM | POA: Diagnosis not present

## 2020-01-20 DIAGNOSIS — F4310 Post-traumatic stress disorder, unspecified: Secondary | ICD-10-CM | POA: Diagnosis not present

## 2020-02-03 DIAGNOSIS — F4310 Post-traumatic stress disorder, unspecified: Secondary | ICD-10-CM | POA: Diagnosis not present

## 2020-02-09 DIAGNOSIS — F4310 Post-traumatic stress disorder, unspecified: Secondary | ICD-10-CM | POA: Diagnosis not present

## 2020-02-15 DIAGNOSIS — S99911A Unspecified injury of right ankle, initial encounter: Secondary | ICD-10-CM | POA: Diagnosis not present

## 2020-02-15 DIAGNOSIS — E669 Obesity, unspecified: Secondary | ICD-10-CM | POA: Diagnosis not present

## 2020-02-27 DIAGNOSIS — F4310 Post-traumatic stress disorder, unspecified: Secondary | ICD-10-CM | POA: Diagnosis not present

## 2020-03-07 DIAGNOSIS — F4310 Post-traumatic stress disorder, unspecified: Secondary | ICD-10-CM | POA: Diagnosis not present

## 2020-03-08 DIAGNOSIS — Q6689 Other  specified congenital deformities of feet: Secondary | ICD-10-CM | POA: Diagnosis not present

## 2020-03-08 DIAGNOSIS — M25571 Pain in right ankle and joints of right foot: Secondary | ICD-10-CM | POA: Diagnosis not present

## 2020-03-10 DIAGNOSIS — M25571 Pain in right ankle and joints of right foot: Secondary | ICD-10-CM | POA: Diagnosis not present

## 2020-03-12 DIAGNOSIS — F4310 Post-traumatic stress disorder, unspecified: Secondary | ICD-10-CM | POA: Diagnosis not present

## 2020-03-14 DIAGNOSIS — Q6689 Other  specified congenital deformities of feet: Secondary | ICD-10-CM | POA: Diagnosis not present

## 2020-03-14 DIAGNOSIS — M79671 Pain in right foot: Secondary | ICD-10-CM | POA: Diagnosis not present

## 2020-03-19 DIAGNOSIS — F4310 Post-traumatic stress disorder, unspecified: Secondary | ICD-10-CM | POA: Diagnosis not present

## 2020-03-28 DIAGNOSIS — Q6689 Other  specified congenital deformities of feet: Secondary | ICD-10-CM | POA: Diagnosis not present

## 2020-04-02 DIAGNOSIS — F4310 Post-traumatic stress disorder, unspecified: Secondary | ICD-10-CM | POA: Diagnosis not present

## 2020-04-13 DIAGNOSIS — Q6689 Other  specified congenital deformities of feet: Secondary | ICD-10-CM | POA: Diagnosis not present

## 2020-04-18 DIAGNOSIS — F4310 Post-traumatic stress disorder, unspecified: Secondary | ICD-10-CM | POA: Diagnosis not present

## 2020-04-27 DIAGNOSIS — F4310 Post-traumatic stress disorder, unspecified: Secondary | ICD-10-CM | POA: Diagnosis not present

## 2020-04-30 DIAGNOSIS — Q6689 Other  specified congenital deformities of feet: Secondary | ICD-10-CM | POA: Diagnosis not present

## 2020-05-02 DIAGNOSIS — F4310 Post-traumatic stress disorder, unspecified: Secondary | ICD-10-CM | POA: Diagnosis not present

## 2020-05-03 DIAGNOSIS — Z6834 Body mass index (BMI) 34.0-34.9, adult: Secondary | ICD-10-CM | POA: Diagnosis not present

## 2020-05-03 DIAGNOSIS — R7982 Elevated C-reactive protein (CRP): Secondary | ICD-10-CM | POA: Diagnosis not present

## 2020-05-03 DIAGNOSIS — F419 Anxiety disorder, unspecified: Secondary | ICD-10-CM | POA: Diagnosis not present

## 2020-05-03 DIAGNOSIS — R7309 Other abnormal glucose: Secondary | ICD-10-CM | POA: Diagnosis not present

## 2020-05-03 DIAGNOSIS — M25561 Pain in right knee: Secondary | ICD-10-CM | POA: Diagnosis not present

## 2020-05-03 DIAGNOSIS — E785 Hyperlipidemia, unspecified: Secondary | ICD-10-CM | POA: Diagnosis not present

## 2020-05-03 DIAGNOSIS — R110 Nausea: Secondary | ICD-10-CM | POA: Diagnosis not present

## 2020-05-13 DIAGNOSIS — M25572 Pain in left ankle and joints of left foot: Secondary | ICD-10-CM | POA: Diagnosis not present

## 2020-05-16 DIAGNOSIS — M25561 Pain in right knee: Secondary | ICD-10-CM | POA: Diagnosis not present

## 2020-05-17 DIAGNOSIS — F4310 Post-traumatic stress disorder, unspecified: Secondary | ICD-10-CM | POA: Diagnosis not present

## 2020-05-18 DIAGNOSIS — Z79899 Other long term (current) drug therapy: Secondary | ICD-10-CM | POA: Diagnosis not present

## 2020-05-21 DIAGNOSIS — Z79899 Other long term (current) drug therapy: Secondary | ICD-10-CM | POA: Diagnosis not present

## 2020-05-25 DIAGNOSIS — Z1231 Encounter for screening mammogram for malignant neoplasm of breast: Secondary | ICD-10-CM | POA: Diagnosis not present

## 2020-06-08 DIAGNOSIS — F4310 Post-traumatic stress disorder, unspecified: Secondary | ICD-10-CM | POA: Diagnosis not present

## 2020-06-11 DIAGNOSIS — M79671 Pain in right foot: Secondary | ICD-10-CM | POA: Diagnosis not present

## 2020-06-11 DIAGNOSIS — Q6689 Other  specified congenital deformities of feet: Secondary | ICD-10-CM | POA: Diagnosis not present

## 2020-06-15 DIAGNOSIS — F4310 Post-traumatic stress disorder, unspecified: Secondary | ICD-10-CM | POA: Diagnosis not present

## 2020-06-16 DIAGNOSIS — F329 Major depressive disorder, single episode, unspecified: Secondary | ICD-10-CM | POA: Diagnosis not present

## 2020-06-16 DIAGNOSIS — E7849 Other hyperlipidemia: Secondary | ICD-10-CM | POA: Diagnosis not present

## 2020-06-25 DIAGNOSIS — Z6832 Body mass index (BMI) 32.0-32.9, adult: Secondary | ICD-10-CM | POA: Diagnosis not present

## 2020-06-25 DIAGNOSIS — E785 Hyperlipidemia, unspecified: Secondary | ICD-10-CM | POA: Diagnosis not present

## 2020-06-25 DIAGNOSIS — F3130 Bipolar disorder, current episode depressed, mild or moderate severity, unspecified: Secondary | ICD-10-CM | POA: Diagnosis not present

## 2020-07-04 DIAGNOSIS — F4310 Post-traumatic stress disorder, unspecified: Secondary | ICD-10-CM | POA: Diagnosis not present

## 2020-07-10 DIAGNOSIS — F4310 Post-traumatic stress disorder, unspecified: Secondary | ICD-10-CM | POA: Diagnosis not present

## 2020-07-17 DIAGNOSIS — E7849 Other hyperlipidemia: Secondary | ICD-10-CM | POA: Diagnosis not present

## 2020-07-17 DIAGNOSIS — F329 Major depressive disorder, single episode, unspecified: Secondary | ICD-10-CM | POA: Diagnosis not present

## 2020-08-01 DIAGNOSIS — M79671 Pain in right foot: Secondary | ICD-10-CM | POA: Diagnosis not present

## 2020-08-01 DIAGNOSIS — Q6689 Other  specified congenital deformities of feet: Secondary | ICD-10-CM | POA: Diagnosis not present

## 2020-08-15 DIAGNOSIS — Q6689 Other  specified congenital deformities of feet: Secondary | ICD-10-CM | POA: Diagnosis not present

## 2020-08-15 DIAGNOSIS — M79671 Pain in right foot: Secondary | ICD-10-CM | POA: Diagnosis not present

## 2020-08-16 DIAGNOSIS — F4310 Post-traumatic stress disorder, unspecified: Secondary | ICD-10-CM | POA: Diagnosis not present

## 2020-08-21 DIAGNOSIS — G8918 Other acute postprocedural pain: Secondary | ICD-10-CM | POA: Diagnosis not present

## 2020-08-21 DIAGNOSIS — M19071 Primary osteoarthritis, right ankle and foot: Secondary | ICD-10-CM | POA: Diagnosis not present

## 2020-08-21 DIAGNOSIS — Q6689 Other  specified congenital deformities of feet: Secondary | ICD-10-CM | POA: Diagnosis not present

## 2020-09-05 DIAGNOSIS — Z4889 Encounter for other specified surgical aftercare: Secondary | ICD-10-CM | POA: Diagnosis not present

## 2020-09-05 DIAGNOSIS — Q6689 Other  specified congenital deformities of feet: Secondary | ICD-10-CM | POA: Diagnosis not present

## 2020-10-03 DIAGNOSIS — Q6689 Other  specified congenital deformities of feet: Secondary | ICD-10-CM | POA: Diagnosis not present

## 2020-10-03 DIAGNOSIS — Z4889 Encounter for other specified surgical aftercare: Secondary | ICD-10-CM | POA: Diagnosis not present

## 2020-11-05 DIAGNOSIS — Q6689 Other  specified congenital deformities of feet: Secondary | ICD-10-CM | POA: Diagnosis not present

## 2020-11-06 DIAGNOSIS — G4700 Insomnia, unspecified: Secondary | ICD-10-CM | POA: Diagnosis not present

## 2020-11-06 DIAGNOSIS — Z6829 Body mass index (BMI) 29.0-29.9, adult: Secondary | ICD-10-CM | POA: Diagnosis not present

## 2020-11-06 DIAGNOSIS — G43909 Migraine, unspecified, not intractable, without status migrainosus: Secondary | ICD-10-CM | POA: Diagnosis not present

## 2020-11-06 DIAGNOSIS — Z0000 Encounter for general adult medical examination without abnormal findings: Secondary | ICD-10-CM | POA: Diagnosis not present

## 2020-12-18 DIAGNOSIS — K2100 Gastro-esophageal reflux disease with esophagitis, without bleeding: Secondary | ICD-10-CM | POA: Diagnosis not present

## 2020-12-18 DIAGNOSIS — N760 Acute vaginitis: Secondary | ICD-10-CM | POA: Diagnosis not present

## 2020-12-18 DIAGNOSIS — K580 Irritable bowel syndrome with diarrhea: Secondary | ICD-10-CM | POA: Diagnosis not present

## 2020-12-19 DIAGNOSIS — M546 Pain in thoracic spine: Secondary | ICD-10-CM | POA: Diagnosis not present

## 2020-12-19 DIAGNOSIS — Z6827 Body mass index (BMI) 27.0-27.9, adult: Secondary | ICD-10-CM | POA: Diagnosis not present

## 2021-01-07 DIAGNOSIS — M79671 Pain in right foot: Secondary | ICD-10-CM | POA: Diagnosis not present

## 2021-01-07 DIAGNOSIS — T8484XA Pain due to internal orthopedic prosthetic devices, implants and grafts, initial encounter: Secondary | ICD-10-CM | POA: Diagnosis not present

## 2021-02-21 DIAGNOSIS — L02214 Cutaneous abscess of groin: Secondary | ICD-10-CM | POA: Diagnosis not present

## 2021-04-08 DIAGNOSIS — Q6689 Other  specified congenital deformities of feet: Secondary | ICD-10-CM | POA: Diagnosis not present

## 2021-04-10 ENCOUNTER — Other Ambulatory Visit: Payer: Self-pay | Admitting: Orthopedic Surgery

## 2021-04-10 DIAGNOSIS — Q6689 Other  specified congenital deformities of feet: Secondary | ICD-10-CM

## 2021-04-29 ENCOUNTER — Ambulatory Visit
Admission: RE | Admit: 2021-04-29 | Discharge: 2021-04-29 | Disposition: A | Discharge status: Home / Self Care | Payer: PPO | Source: Ambulatory Visit | Attending: Orthopedic Surgery | Admitting: Orthopedic Surgery

## 2021-04-29 DIAGNOSIS — Q6689 Other  specified congenital deformities of feet: Secondary | ICD-10-CM | POA: Diagnosis not present

## 2021-04-29 DIAGNOSIS — Z9889 Other specified postprocedural states: Secondary | ICD-10-CM | POA: Diagnosis not present

## 2021-04-29 DIAGNOSIS — R600 Localized edema: Secondary | ICD-10-CM | POA: Diagnosis not present

## 2021-04-29 DIAGNOSIS — Z981 Arthrodesis status: Secondary | ICD-10-CM | POA: Diagnosis not present

## 2021-05-06 DIAGNOSIS — L732 Hidradenitis suppurativa: Secondary | ICD-10-CM | POA: Diagnosis not present

## 2021-05-08 DIAGNOSIS — F419 Anxiety disorder, unspecified: Secondary | ICD-10-CM | POA: Diagnosis not present

## 2021-05-08 DIAGNOSIS — R519 Headache, unspecified: Secondary | ICD-10-CM | POA: Diagnosis not present

## 2021-05-08 DIAGNOSIS — R509 Fever, unspecified: Secondary | ICD-10-CM | POA: Diagnosis not present

## 2021-05-08 DIAGNOSIS — Z20828 Contact with and (suspected) exposure to other viral communicable diseases: Secondary | ICD-10-CM | POA: Diagnosis not present

## 2021-05-13 DIAGNOSIS — R0781 Pleurodynia: Secondary | ICD-10-CM | POA: Diagnosis not present

## 2021-05-13 DIAGNOSIS — S2242XA Multiple fractures of ribs, left side, initial encounter for closed fracture: Secondary | ICD-10-CM | POA: Diagnosis not present

## 2021-05-13 DIAGNOSIS — R059 Cough, unspecified: Secondary | ICD-10-CM | POA: Diagnosis not present

## 2021-05-13 DIAGNOSIS — Z6824 Body mass index (BMI) 24.0-24.9, adult: Secondary | ICD-10-CM | POA: Diagnosis not present

## 2021-05-13 DIAGNOSIS — U071 COVID-19: Secondary | ICD-10-CM | POA: Diagnosis not present

## 2021-05-15 DIAGNOSIS — T8484XA Pain due to internal orthopedic prosthetic devices, implants and grafts, initial encounter: Secondary | ICD-10-CM | POA: Diagnosis not present

## 2021-05-29 DIAGNOSIS — Z23 Encounter for immunization: Secondary | ICD-10-CM | POA: Diagnosis not present

## 2021-05-29 DIAGNOSIS — S2242XA Multiple fractures of ribs, left side, initial encounter for closed fracture: Secondary | ICD-10-CM | POA: Diagnosis not present

## 2021-05-29 DIAGNOSIS — E538 Deficiency of other specified B group vitamins: Secondary | ICD-10-CM | POA: Diagnosis not present

## 2021-05-29 DIAGNOSIS — M8580 Other specified disorders of bone density and structure, unspecified site: Secondary | ICD-10-CM | POA: Diagnosis not present

## 2021-05-29 DIAGNOSIS — Z6824 Body mass index (BMI) 24.0-24.9, adult: Secondary | ICD-10-CM | POA: Diagnosis not present

## 2021-05-29 DIAGNOSIS — Z01419 Encounter for gynecological examination (general) (routine) without abnormal findings: Secondary | ICD-10-CM | POA: Diagnosis not present

## 2021-05-30 DIAGNOSIS — Z124 Encounter for screening for malignant neoplasm of cervix: Secondary | ICD-10-CM | POA: Diagnosis not present

## 2021-06-03 DIAGNOSIS — M25562 Pain in left knee: Secondary | ICD-10-CM | POA: Diagnosis not present

## 2021-06-04 DIAGNOSIS — Z1231 Encounter for screening mammogram for malignant neoplasm of breast: Secondary | ICD-10-CM | POA: Diagnosis not present

## 2021-07-02 DIAGNOSIS — Z6824 Body mass index (BMI) 24.0-24.9, adult: Secondary | ICD-10-CM | POA: Diagnosis not present

## 2021-07-02 DIAGNOSIS — F3130 Bipolar disorder, current episode depressed, mild or moderate severity, unspecified: Secondary | ICD-10-CM | POA: Diagnosis not present

## 2021-07-02 DIAGNOSIS — R110 Nausea: Secondary | ICD-10-CM | POA: Diagnosis not present

## 2021-07-07 DIAGNOSIS — M25562 Pain in left knee: Secondary | ICD-10-CM | POA: Diagnosis not present

## 2021-07-09 DIAGNOSIS — M25562 Pain in left knee: Secondary | ICD-10-CM | POA: Diagnosis not present

## 2021-07-30 DIAGNOSIS — M545 Low back pain, unspecified: Secondary | ICD-10-CM | POA: Diagnosis not present

## 2021-07-30 DIAGNOSIS — Z6824 Body mass index (BMI) 24.0-24.9, adult: Secondary | ICD-10-CM | POA: Diagnosis not present

## 2021-08-05 DIAGNOSIS — M25562 Pain in left knee: Secondary | ICD-10-CM | POA: Diagnosis not present

## 2021-08-05 DIAGNOSIS — M6752 Plica syndrome, left knee: Secondary | ICD-10-CM | POA: Diagnosis not present

## 2021-08-07 DIAGNOSIS — Z20828 Contact with and (suspected) exposure to other viral communicable diseases: Secondary | ICD-10-CM | POA: Diagnosis not present

## 2021-08-07 DIAGNOSIS — R509 Fever, unspecified: Secondary | ICD-10-CM | POA: Diagnosis not present

## 2021-08-14 DIAGNOSIS — L732 Hidradenitis suppurativa: Secondary | ICD-10-CM | POA: Diagnosis not present

## 2021-08-20 DIAGNOSIS — G8918 Other acute postprocedural pain: Secondary | ICD-10-CM | POA: Diagnosis not present

## 2021-08-20 DIAGNOSIS — T8484XA Pain due to internal orthopedic prosthetic devices, implants and grafts, initial encounter: Secondary | ICD-10-CM | POA: Diagnosis not present

## 2021-09-24 DIAGNOSIS — Y999 Unspecified external cause status: Secondary | ICD-10-CM | POA: Diagnosis not present

## 2021-09-24 DIAGNOSIS — G8918 Other acute postprocedural pain: Secondary | ICD-10-CM | POA: Diagnosis not present

## 2021-09-24 DIAGNOSIS — Z9889 Other specified postprocedural states: Secondary | ICD-10-CM | POA: Diagnosis not present

## 2021-09-24 DIAGNOSIS — S83282A Other tear of lateral meniscus, current injury, left knee, initial encounter: Secondary | ICD-10-CM | POA: Diagnosis not present

## 2021-09-24 DIAGNOSIS — M6752 Plica syndrome, left knee: Secondary | ICD-10-CM | POA: Diagnosis not present

## 2021-09-24 DIAGNOSIS — X58XXXA Exposure to other specified factors, initial encounter: Secondary | ICD-10-CM | POA: Diagnosis not present

## 2021-10-02 DIAGNOSIS — M25562 Pain in left knee: Secondary | ICD-10-CM | POA: Diagnosis not present

## 2021-10-08 DIAGNOSIS — M25562 Pain in left knee: Secondary | ICD-10-CM | POA: Diagnosis not present

## 2021-10-10 DIAGNOSIS — M25562 Pain in left knee: Secondary | ICD-10-CM | POA: Diagnosis not present

## 2021-10-14 DIAGNOSIS — Z4789 Encounter for other orthopedic aftercare: Secondary | ICD-10-CM | POA: Diagnosis not present

## 2021-10-18 DIAGNOSIS — Z4889 Encounter for other specified surgical aftercare: Secondary | ICD-10-CM | POA: Diagnosis not present

## 2021-10-18 DIAGNOSIS — M25562 Pain in left knee: Secondary | ICD-10-CM | POA: Diagnosis not present

## 2021-10-18 DIAGNOSIS — T8484XA Pain due to internal orthopedic prosthetic devices, implants and grafts, initial encounter: Secondary | ICD-10-CM | POA: Diagnosis not present

## 2021-10-25 DIAGNOSIS — M25562 Pain in left knee: Secondary | ICD-10-CM | POA: Diagnosis not present

## 2021-10-25 DIAGNOSIS — Z4789 Encounter for other orthopedic aftercare: Secondary | ICD-10-CM | POA: Diagnosis not present

## 2021-10-28 ENCOUNTER — Other Ambulatory Visit: Payer: Self-pay

## 2021-10-28 ENCOUNTER — Observation Stay (HOSPITAL_COMMUNITY)
Admission: AD | Admit: 2021-10-28 | Discharge: 2021-10-30 | Disposition: A | Discharge status: Home / Self Care | Payer: PPO | Source: Ambulatory Visit | Attending: Specialist | Admitting: Specialist

## 2021-10-28 ENCOUNTER — Ambulatory Visit (HOSPITAL_BASED_OUTPATIENT_CLINIC_OR_DEPARTMENT_OTHER): Payer: PPO | Admitting: Anesthesiology

## 2021-10-28 ENCOUNTER — Encounter (HOSPITAL_COMMUNITY): Payer: Self-pay | Admitting: Specialist

## 2021-10-28 ENCOUNTER — Ambulatory Visit (HOSPITAL_COMMUNITY): Payer: PPO | Admitting: Anesthesiology

## 2021-10-28 ENCOUNTER — Encounter (HOSPITAL_COMMUNITY)
Admission: AD | Disposition: A | Discharge status: Home / Self Care | Payer: Self-pay | Source: Ambulatory Visit | Attending: Specialist

## 2021-10-28 DIAGNOSIS — Z87891 Personal history of nicotine dependence: Secondary | ICD-10-CM | POA: Diagnosis not present

## 2021-10-28 DIAGNOSIS — Z79899 Other long term (current) drug therapy: Secondary | ICD-10-CM | POA: Insufficient documentation

## 2021-10-28 DIAGNOSIS — M659 Synovitis and tenosynovitis, unspecified: Secondary | ICD-10-CM

## 2021-10-28 DIAGNOSIS — T8140XA Infection following a procedure, unspecified, initial encounter: Secondary | ICD-10-CM | POA: Diagnosis not present

## 2021-10-28 DIAGNOSIS — Z01818 Encounter for other preprocedural examination: Secondary | ICD-10-CM

## 2021-10-28 DIAGNOSIS — M009 Pyogenic arthritis, unspecified: Secondary | ICD-10-CM | POA: Diagnosis present

## 2021-10-28 DIAGNOSIS — M1712 Unilateral primary osteoarthritis, left knee: Secondary | ICD-10-CM | POA: Diagnosis not present

## 2021-10-28 DIAGNOSIS — Z20822 Contact with and (suspected) exposure to covid-19: Secondary | ICD-10-CM | POA: Insufficient documentation

## 2021-10-28 HISTORY — DX: Other specified postprocedural states: Z98.890

## 2021-10-28 HISTORY — PX: INCISION AND DRAINAGE OF WOUND: SHX1803

## 2021-10-28 HISTORY — DX: Nausea with vomiting, unspecified: R11.2

## 2021-10-28 HISTORY — DX: Other complications of anesthesia, initial encounter: T88.59XA

## 2021-10-28 LAB — COMPREHENSIVE METABOLIC PANEL
ALT: 12 U/L (ref 0–44)
ALT: 14 U/L (ref 0–44)
AST: 12 U/L — ABNORMAL LOW (ref 15–41)
AST: 14 U/L — ABNORMAL LOW (ref 15–41)
Albumin: 3.7 g/dL (ref 3.5–5.0)
Albumin: 4.1 g/dL (ref 3.5–5.0)
Alkaline Phosphatase: 41 U/L (ref 38–126)
Alkaline Phosphatase: 47 U/L (ref 38–126)
Anion gap: 10 (ref 5–15)
Anion gap: 9 (ref 5–15)
BUN: 13 mg/dL (ref 6–20)
BUN: 11 mg/dL (ref 6–20)
CO2: 23 mmol/L (ref 22–32)
CO2: 23 mmol/L (ref 22–32)
Calcium: 9.9 mg/dL (ref 8.9–10.3)
Calcium: 9.7 mg/dL (ref 8.9–10.3)
Chloride: 105 mmol/L (ref 98–111)
Chloride: 102 mmol/L (ref 98–111)
Creatinine, Ser: 0.72 mg/dL (ref 0.44–1.00)
Creatinine, Ser: 0.81 mg/dL (ref 0.44–1.00)
GFR, Estimated: >60 mL/min (ref >60)
GFR, Estimated: >60 mL/min (ref >60)
Glucose, Bld: 88 mg/dL (ref 70–99)
Glucose, Bld: 105 mg/dL — ABNORMAL HIGH (ref 70–99)
Potassium: 4 mmol/L (ref 3.5–5.1)
Potassium: 3.7 mmol/L (ref 3.5–5.1)
Sodium: 138 mmol/L (ref 135–145)
Sodium: 134 mmol/L — ABNORMAL LOW (ref 135–145)
Total Bilirubin: 0.5 mg/dL (ref 0.3–1.2)
Total Bilirubin: 0.5 mg/dL (ref 0.3–1.2)
Total Protein: 6.9 g/dL (ref 6.5–8.1)
Total Protein: 7.5 g/dL (ref 6.5–8.1)

## 2021-10-28 LAB — CBC
HCT: 36.1 % (ref 36.0–46.0)
HCT: 40.9 % (ref 36.0–46.0)
Hemoglobin: 12.5 g/dL (ref 12.0–15.0)
Hemoglobin: 14.1 g/dL (ref 12.0–15.0)
MCH: 30.5 pg (ref 26.0–34.0)
MCH: 30.9 pg (ref 26.0–34.0)
MCHC: 34.6 g/dL (ref 30.0–36.0)
MCHC: 34.5 g/dL (ref 30.0–36.0)
MCV: 88 fL (ref 80.0–100.0)
MCV: 89.5 fL (ref 80.0–100.0)
Platelets: 243 10*3/uL (ref 150–400)
Platelets: 283 10*3/uL (ref 150–400)
RBC: 4.1 MIL/uL (ref 3.87–5.11)
RBC: 4.57 MIL/uL (ref 3.87–5.11)
RDW: 14.2 % (ref 11.5–15.5)
RDW: 14.1 % (ref 11.5–15.5)
WBC: 8 10*3/uL (ref 4.0–10.5)
WBC: 7.1 10*3/uL (ref 4.0–10.5)
nRBC: 0 % (ref 0.0–0.2)
nRBC: 0 % (ref 0.0–0.2)

## 2021-10-28 LAB — PREGNANCY, URINE: Preg Test, Ur: NEGATIVE

## 2021-10-28 LAB — SEDIMENTATION RATE: Sed Rate: 13 mm/hr (ref 0–22)

## 2021-10-28 LAB — SARS CORONAVIRUS 2 BY RT PCR (HOSPITAL ORDER, PERFORMED IN ~~LOC~~ HOSPITAL LAB): SARS Coronavirus 2: NEGATIVE

## 2021-10-28 SURGERY — IRRIGATION AND DEBRIDEMENT WOUND
Anesthesia: General | Laterality: Left

## 2021-10-28 MED ORDER — MIDAZOLAM HCL 2 MG/2ML IJ SOLN
INTRAMUSCULAR | Status: AC
  Filled 2021-10-28: qty 2

## 2021-10-28 MED ORDER — ONDANSETRON HCL 4 MG/2ML IJ SOLN
4.0000 mg | Freq: Four times a day (QID) | INTRAMUSCULAR | Status: DC | PRN
  Administered 2021-10-28 – 2021-10-29 (×2): 4 mg via INTRAVENOUS
  Filled 2021-10-28 (×2): qty 2

## 2021-10-28 MED ORDER — DIPHENHYDRAMINE HCL 12.5 MG/5ML PO ELIX
12.5000 mg | ORAL_SOLUTION | ORAL | Status: DC | PRN
  Filled 2021-10-28: qty 10

## 2021-10-28 MED ORDER — ENOXAPARIN SODIUM 40 MG/0.4ML IJ SOSY
40.0000 mg | PREFILLED_SYRINGE | INTRAMUSCULAR | Status: DC
  Administered 2021-10-29 – 2021-10-30 (×2): 40 mg via SUBCUTANEOUS
  Filled 2021-10-28 (×2): qty 0.4

## 2021-10-28 MED ORDER — CHLORHEXIDINE GLUCONATE 0.12 % MT SOLN
15.0000 mL | Freq: Once | OROMUCOSAL | Status: AC
  Administered 2021-10-28: 15 mL via OROMUCOSAL

## 2021-10-28 MED ORDER — OXYCODONE HCL 5 MG PO TABS: 5.0000 mg | ORAL_TABLET | ORAL | Status: DC | PRN

## 2021-10-28 MED ORDER — ONDANSETRON HCL 4 MG PO TABS
4.0000 mg | ORAL_TABLET | Freq: Four times a day (QID) | ORAL | Status: DC | PRN
Start: 2021-10-28 — End: 2021-10-30
  Filled 2021-10-28: qty 1

## 2021-10-28 MED ORDER — METHOCARBAMOL 500 MG IVPB - SIMPLE MED
INTRAVENOUS | Status: AC
  Filled 2021-10-28: qty 50

## 2021-10-28 MED ORDER — TRAMADOL HCL 50 MG PO TABS
50.0000 mg | ORAL_TABLET | Freq: Four times a day (QID) | ORAL | Status: DC
  Administered 2021-10-28 – 2021-10-30 (×6): 50 mg via ORAL
  Filled 2021-10-28 (×6): qty 1

## 2021-10-28 MED ORDER — ACETAMINOPHEN 325 MG PO TABS: 325.0000 mg | ORAL_TABLET | Freq: Four times a day (QID) | ORAL | Status: DC | PRN

## 2021-10-28 MED ORDER — SUMATRIPTAN SUCCINATE 50 MG PO TABS
50.0000 mg | ORAL_TABLET | ORAL | Status: DC | PRN
  Filled 2021-10-28: qty 1

## 2021-10-28 MED ORDER — ACETAMINOPHEN 500 MG PO TABS
1000.0000 mg | ORAL_TABLET | Freq: Once | ORAL | Status: AC
  Administered 2021-10-28: 1000 mg via ORAL
  Filled 2021-10-28: qty 2

## 2021-10-28 MED ORDER — OXYCODONE HCL 5 MG PO TABS
5.0000 mg | ORAL_TABLET | Freq: Once | ORAL | Status: AC | PRN
  Administered 2021-10-28: 5 mg via ORAL

## 2021-10-28 MED ORDER — OXYCODONE HCL 5 MG/5ML PO SOLN: 5.0000 mg | Freq: Once | ORAL | Status: AC | PRN

## 2021-10-28 MED ORDER — SENNOSIDES-DOCUSATE SODIUM 8.6-50 MG PO TABS
1.0000 | ORAL_TABLET | Freq: Every evening | ORAL | Status: DC | PRN
  Filled 2021-10-28: qty 1

## 2021-10-28 MED ORDER — ZOLPIDEM TARTRATE 5 MG PO TABS
5.0000 mg | ORAL_TABLET | Freq: Every evening | ORAL | Status: DC | PRN
Start: 2021-10-28 — End: 2021-10-30
  Administered 2021-10-28 – 2021-10-30 (×2): 5 mg via ORAL
  Filled 2021-10-28 (×2): qty 1

## 2021-10-28 MED ORDER — BISACODYL 5 MG PO TBEC
5.0000 mg | DELAYED_RELEASE_TABLET | Freq: Every day | ORAL | Status: DC | PRN
Start: 2021-10-28 — End: 2021-10-30
  Filled 2021-10-28: qty 1

## 2021-10-28 MED ORDER — LURASIDONE HCL 40 MG PO TABS
40.0000 mg | ORAL_TABLET | Freq: Every day | ORAL | Status: DC
  Administered 2021-10-28 – 2021-10-29 (×2): 40 mg via ORAL
  Filled 2021-10-28 (×2): qty 1

## 2021-10-28 MED ORDER — SODIUM CHLORIDE 0.9 % IR SOLN
Status: DC | PRN
Start: 2021-10-28 — End: 2021-10-28
  Administered 2021-10-28: 3000 mL
  Administered 2021-10-28: 6000 mL

## 2021-10-28 MED ORDER — SODIUM CHLORIDE 0.9 % IV SOLN: INTRAVENOUS | Status: DC

## 2021-10-28 MED ORDER — MIDAZOLAM HCL 2 MG/2ML IJ SOLN: 0.5000 mg | Freq: Once | INTRAMUSCULAR | Status: DC | PRN

## 2021-10-28 MED ORDER — METHOCARBAMOL 500 MG IVPB - SIMPLE MED
500.0000 mg | Freq: Four times a day (QID) | INTRAVENOUS | Status: DC | PRN
  Administered 2021-10-28: 500 mg via INTRAVENOUS
  Filled 2021-10-28: qty 50

## 2021-10-28 MED ORDER — LORAZEPAM 1 MG PO TABS
1.0000 mg | ORAL_TABLET | Freq: Four times a day (QID) | ORAL | Status: DC
  Administered 2021-10-28 – 2021-10-30 (×6): 1 mg via ORAL
  Filled 2021-10-28 (×6): qty 1

## 2021-10-28 MED ORDER — OXYCODONE HCL 5 MG PO TABS
ORAL_TABLET | ORAL | Status: AC
  Filled 2021-10-28: qty 1

## 2021-10-28 MED ORDER — DEXAMETHASONE SODIUM PHOSPHATE 10 MG/ML IJ SOLN
INTRAMUSCULAR | Status: DC | PRN
  Administered 2021-10-28: 10 mg via INTRAVENOUS

## 2021-10-28 MED ORDER — PROMETHAZINE HCL 25 MG/ML IJ SOLN: 6.2500 mg | INTRAMUSCULAR | Status: DC | PRN

## 2021-10-28 MED ORDER — CEFAZOLIN SODIUM-DEXTROSE 1-4 GM/50ML-% IV SOLN
1.0000 g | Freq: Four times a day (QID) | INTRAVENOUS | Status: DC
  Administered 2021-10-28: 1 g via INTRAVENOUS
  Filled 2021-10-28: qty 50

## 2021-10-28 MED ORDER — MEPERIDINE HCL 50 MG/ML IJ SOLN: 6.2500 mg | INTRAMUSCULAR | Status: DC | PRN

## 2021-10-28 MED ORDER — CEFAZOLIN SODIUM-DEXTROSE 2-3 GM-%(50ML) IV SOLR
INTRAVENOUS | Status: DC | PRN
  Administered 2021-10-28: 2 g via INTRAVENOUS

## 2021-10-28 MED ORDER — CEFAZOLIN SODIUM-DEXTROSE 2-4 GM/100ML-% IV SOLN
INTRAVENOUS | Status: AC
  Filled 2021-10-28: qty 100

## 2021-10-28 MED ORDER — BUPIVACAINE HCL 0.25 % IJ SOLN
INTRAMUSCULAR | Status: AC
  Filled 2021-10-28: qty 1

## 2021-10-28 MED ORDER — SPIRONOLACTONE 100 MG PO TABS
100.0000 mg | ORAL_TABLET | Freq: Every day | ORAL | Status: DC
  Administered 2021-10-29: 100 mg via ORAL
  Filled 2021-10-28 (×2): qty 1

## 2021-10-28 MED ORDER — HYDROMORPHONE HCL 1 MG/ML IJ SOLN: 0.5000 mg | INTRAMUSCULAR | Status: DC | PRN

## 2021-10-28 MED ORDER — LIDOCAINE 2% (20 MG/ML) 5 ML SYRINGE
INTRAMUSCULAR | Status: DC | PRN
  Administered 2021-10-28: 20 mg via INTRAVENOUS

## 2021-10-28 MED ORDER — HYDROMORPHONE HCL 1 MG/ML IJ SOLN
0.5000 mg | INTRAMUSCULAR | Status: DC | PRN
  Administered 2021-10-28 – 2021-10-30 (×6): 1 mg via INTRAVENOUS
  Filled 2021-10-28 (×6): qty 1

## 2021-10-28 MED ORDER — MIDAZOLAM HCL 2 MG/2ML IJ SOLN
2.0000 mg | Freq: Once | INTRAMUSCULAR | Status: AC
Start: 2021-10-28 — End: 2021-10-28
  Administered 2021-10-28 (×2): 1 mg via INTRAVENOUS
  Filled 2021-10-28: qty 2

## 2021-10-28 MED ORDER — HYDROMORPHONE HCL 1 MG/ML IJ SOLN
INTRAMUSCULAR | Status: AC
  Filled 2021-10-28: qty 2

## 2021-10-28 MED ORDER — METHOCARBAMOL 500 MG PO TABS
500.0000 mg | ORAL_TABLET | Freq: Four times a day (QID) | ORAL | Status: DC | PRN
  Administered 2021-10-29 – 2021-10-30 (×3): 500 mg via ORAL
  Filled 2021-10-28 (×3): qty 1

## 2021-10-28 MED ORDER — FENTANYL CITRATE (PF) 100 MCG/2ML IJ SOLN
INTRAMUSCULAR | Status: DC | PRN
  Administered 2021-10-28: 100 ug via INTRAVENOUS

## 2021-10-28 MED ORDER — HYDROMORPHONE HCL 1 MG/ML IJ SOLN
0.2500 mg | INTRAMUSCULAR | Status: DC | PRN
  Administered 2021-10-28 (×4): 0.5 mg via INTRAVENOUS

## 2021-10-28 MED ORDER — OXYCODONE HCL 5 MG PO TABS
10.0000 mg | ORAL_TABLET | ORAL | Status: DC | PRN
  Administered 2021-10-29 – 2021-10-30 (×5): 15 mg via ORAL
  Filled 2021-10-28 (×5): qty 3

## 2021-10-28 MED ORDER — PROPRANOLOL HCL 10 MG PO TABS
10.0000 mg | ORAL_TABLET | Freq: Three times a day (TID) | ORAL | Status: DC
  Administered 2021-10-28 – 2021-10-30 (×4): 10 mg via ORAL
  Filled 2021-10-28 (×6): qty 1

## 2021-10-28 MED ORDER — ROSUVASTATIN CALCIUM 10 MG PO TABS
10.0000 mg | ORAL_TABLET | Freq: Every day | ORAL | Status: DC
  Administered 2021-10-28 – 2021-10-29 (×2): 10 mg via ORAL
  Filled 2021-10-28 (×2): qty 1

## 2021-10-28 MED ORDER — FENTANYL CITRATE (PF) 100 MCG/2ML IJ SOLN
INTRAMUSCULAR | Status: AC
  Filled 2021-10-28: qty 2

## 2021-10-28 MED ORDER — MIDAZOLAM HCL 5 MG/5ML IJ SOLN
INTRAMUSCULAR | Status: DC | PRN
  Administered 2021-10-28: 2 mg via INTRAVENOUS

## 2021-10-28 MED ORDER — PANTOPRAZOLE SODIUM 40 MG PO TBEC
80.0000 mg | DELAYED_RELEASE_TABLET | Freq: Two times a day (BID) | ORAL | Status: DC
Start: 2021-10-28 — End: 2021-10-30
  Administered 2021-10-28 – 2021-10-30 (×4): 80 mg via ORAL
  Filled 2021-10-28 (×5): qty 2

## 2021-10-28 MED ORDER — ONDANSETRON HCL 4 MG/2ML IJ SOLN
INTRAMUSCULAR | Status: DC | PRN
  Administered 2021-10-28: 4 mg via INTRAVENOUS

## 2021-10-28 MED ORDER — ACETAMINOPHEN 500 MG PO TABS
1000.0000 mg | ORAL_TABLET | Freq: Four times a day (QID) | ORAL | Status: AC
  Administered 2021-10-28 – 2021-10-29 (×3): 1000 mg via ORAL
  Filled 2021-10-28 (×3): qty 2

## 2021-10-28 MED ORDER — LACTATED RINGERS IV SOLN
INTRAVENOUS | Status: DC
Start: 2021-10-28 — End: 2021-10-28

## 2021-10-28 MED ORDER — PROPOFOL 10 MG/ML IV BOLUS
INTRAVENOUS | Status: DC | PRN
  Administered 2021-10-28: 200 mg via INTRAVENOUS

## 2021-10-28 SURGICAL SUPPLY — 44 items
BAG COUNTER SPONGE SURGICOUNT (BAG) ×1 IMPLANT
BANDAGE ESMARK 6X9 LF (GAUZE/BANDAGES/DRESSINGS) ×1 IMPLANT
BNDG ELASTIC 4X5.8 VLCR STR LF (GAUZE/BANDAGES/DRESSINGS) ×1 IMPLANT
BNDG ESMARK 6X9 LF (GAUZE/BANDAGES/DRESSINGS) ×2
BNDG GAUZE ELAST 4 BULKY (GAUZE/BANDAGES/DRESSINGS) ×2 IMPLANT
DRAPE ARTHROSCOPY W/POUCH 114 (DRAPES) ×2 IMPLANT
DRAPE C-ARM 42X120 X-RAY (DRAPES) ×2 IMPLANT
DRAPE INCISE IOBAN 66X45 STRL (DRAPES) ×2 IMPLANT
DRAPE SHEET LG 3/4 BI-LAMINATE (DRAPES) ×2 IMPLANT
DRAPE U-SHAPE 47X51 STRL (DRAPES) ×2 IMPLANT
DRSG PAD ABDOMINAL 8X10 ST (GAUZE/BANDAGES/DRESSINGS) ×2 IMPLANT
DURAPREP 26ML APPLICATOR (WOUND CARE) ×2 IMPLANT
DW OUTFLOW CASSETTE/TUBE SET (MISCELLANEOUS) ×2 IMPLANT
ELECT MENISCUS 165MM 90D (ELECTRODE) IMPLANT
EXCALIBUR 3.8MM X 13CM (MISCELLANEOUS) ×2 IMPLANT
GAUZE 4X4 16PLY ~~LOC~~+RFID DBL (SPONGE) ×1 IMPLANT
GAUZE SPONGE 4X4 12PLY STRL (GAUZE/BANDAGES/DRESSINGS) ×2 IMPLANT
GAUZE XEROFORM 1X8 LF (GAUZE/BANDAGES/DRESSINGS) ×2 IMPLANT
GLOVE SURG ENC MOIS LTX SZ7.5 (GLOVE) ×2 IMPLANT
GLOVE SURG ENC MOIS LTX SZ8 (GLOVE) ×2 IMPLANT
GLOVE SURG UNDER LTX SZ8 (GLOVE) ×4 IMPLANT
GOWN STRL REUS W/ TWL LRG LVL3 (GOWN DISPOSABLE) IMPLANT
GOWN STRL REUS W/TWL LRG LVL3 (GOWN DISPOSABLE)
IMMOBILIZER KNEE 20 (SOFTGOODS) ×2 IMPLANT
IMMOBILIZER KNEE 20 THIGH 36 (SOFTGOODS) IMPLANT
IV NS IRRIG 3000ML ARTHROMATIC (IV SOLUTION) ×4 IMPLANT
KIT BASIN OR (CUSTOM PROCEDURE TRAY) ×2 IMPLANT
KIT MIXER ACCUMIX (KITS) IMPLANT
MANIFOLD NEPTUNE II (INSTRUMENTS) ×2 IMPLANT
NEEDLE HYPO 22GX1.5 SAFETY (NEEDLE) IMPLANT
PACK ARTHROSCOPY WL (CUSTOM PROCEDURE TRAY) ×2 IMPLANT
PAD ARMBOARD 7.5X6 YLW CONV (MISCELLANEOUS) IMPLANT
PENCIL SMOKE EVACUATOR (MISCELLANEOUS) IMPLANT
PROBE BIPOLAR 50 DEGREE SUCT (MISCELLANEOUS) IMPLANT
SUT ETHILON 4 0 PS 2 18 (SUTURE) ×2 IMPLANT
SUT VIC AB 2-0 SH 27 (SUTURE) ×2
SUT VIC AB 2-0 SH 27XBRD (SUTURE) IMPLANT
SYR CONTROL 10ML LL (SYRINGE) ×2 IMPLANT
TOWEL OR 17X26 10 PK STRL BLUE (TOWEL DISPOSABLE) ×2 IMPLANT
TUBING ARTHROSCOPY IRRIG 16FT (MISCELLANEOUS) ×2 IMPLANT
TUBING CONNECTING 10 (TUBING) ×1 IMPLANT
WAND APOLLORF SJ50 AR-9845 (SURGICAL WAND) ×1 IMPLANT
WATER STERILE IRR 500ML POUR (IV SOLUTION) ×1 IMPLANT
WRAP KNEE MAXI GEL POST OP (GAUZE/BANDAGES/DRESSINGS) ×2 IMPLANT

## 2021-10-28 NOTE — Anesthesia Postprocedure Evaluation (Signed)
Anesthesia Post Note ? ?Patient: Kayla Holden ? ?Procedure(s) Performed: KNEE ARTHROSCOPY WITH IRRIGATION AND DEBRIDEMENT (Left) ? ?  ? ?Patient location during evaluation: PACU ?Anesthesia Type: General ?Level of consciousness: awake and alert, patient cooperative and oriented ?Pain management: pain level controlled ?Vital Signs Assessment: post-procedure vital signs reviewed and stable ?Respiratory status: spontaneous breathing, nonlabored ventilation and respiratory function stable ?Cardiovascular status: blood pressure returned to baseline and stable ?Postop Assessment: no apparent nausea or vomiting ?Anesthetic complications: no ? ? ?No notable events documented. ? ?Last Vitals:  ?Vitals:  ? 10/28/21 2011 10/28/21 2024  ?BP:  (!) 128/92  ?Pulse:  84  ?Resp:  16  ?Temp: 36.5 ?C 36.6 ?C  ?SpO2:  92%  ?  ?Last Pain:  ?Vitals:  ? 10/28/21 2024  ?TempSrc: Oral  ?PainSc:   ? ? ?  ?  ?  ?  ?  ?  ? ?Maryana Pittmon,E. Nickalas Mccarrick ? ? ? ? ?

## 2021-10-28 NOTE — H&P (Signed)
Kayla Holden is an 41 y.o. female.   Chief Complaint: Left knee pain post left knee arthroscopic plica resection HPI: This is a pleasant 41 year old female who about 4-1/2 5 weeks ago had a left knee plica resection done arthroscopically at the surgical center of [facility].  About a week and a half ago closer to 2 weeks she was in physical therapy when she felt a pop in one of the incisions came apart.  She states ever since then she has been having some increased pain in his knee and now she is having some noises coming with flexion and extension.  She had a lot of pain in the knee with any kind of movement.  She was on Keflex for 10 days no improvement noted.  MRI scan was done in the office which showed gas produced in the knee joint.  Past Medical History:  Diagnosis Date   Anxiety    Anxiety    Bipolar 1 disorder ([place])    Chiari malformation    Complication of anesthesia    Depression    GERD (gastroesophageal reflux disease)    MDD (major depressive disorder)    Microscopic colitis    Migraine headache    PONV (postoperative nausea and vomiting)     Past Surgical History:  Procedure Laterality Date   APPENDECTOMY     CHOLECYSTECTOMY     KNEE SURGERY Left    3 times   MANDIBLE SURGERY      Family History  Problem Relation Age of Onset   Cancer Maternal Grandmother    Cancer Paternal Grandfather    Allergic rhinitis Neg Hx    Angioedema Neg Hx    Asthma Neg Hx    Atopy Neg Hx    Eczema Neg Hx    Urticaria Neg Hx    Immunodeficiency Neg Hx    Social History:  reports that she has quit smoking. Her smoking use included cigarettes. She has never used smokeless tobacco. She reports current drug use. Drug: Marijuana. She reports that she does not drink alcohol.  Allergies:  Allergies  Allergen Reactions   Codeine Other (See Comments)    Patient has "CYP2D6," so NO CODEINE   Fioricet-Codeine [Butalbital-Apap-Caff-Cod] Other (See Comments)    Patient has "CYP2D6" and  cannot tolerate codeine   Isoniazid Shortness Of Breath   Ketorolac Tromethamine Other (See Comments)    Abdominal Pain   Rifampin Shortness Of Breath and Hives   Tape Other (See Comments)    Paper tape causes blisters    Clindamycin Diarrhea and Other (See Comments)    Abdominal pain, also   Ketoprofen Other (See Comments)    Abdominal pain and cannot take due to history of colitis   Nsaids Other (See Comments)    History of colitis   Butalbital-Aspirin-Caffeine Palpitations and Other (See Comments)    Caused rapid heart rate and history of colitis   Clindamycin/Lincomycin Other (See Comments)    Abdominal pain    Medications Prior to Admission  Medication Sig Dispense Refill   dicyclomine (BENTYL) 20 MG tablet Take 40 mg by mouth 4 (four) times daily as needed for spasms.     diphenoxylate-atropine (LOMOTIL) 2.5-0.025 MG tablet Take 1 tablet by mouth 4 (four) times daily as needed for diarrhea or loose stools.     dronabinol (MARINOL) 5 MG capsule Take 5 mg by mouth 2 (two) times daily as needed (for nausea or appetite).     drospirenone-ethinyl estradiol (YAZ) 3-0.02 MG  tablet Take 1 tablet by mouth daily with lunch.     LATUDA 40 MG TABS tablet Take 40 mg by mouth See admin instructions. Take 40 mg by mouth at 6 PM     LORazepam (ATIVAN) 1 MG tablet Take 1 mg by mouth every 6 (six) hours.     ondansetron (ZOFRAN-ODT) 8 MG disintegrating tablet Take 8 mg by mouth every 8 (eight) hours as needed for nausea (DISSOLVE ORALLY).     pantoprazole (PROTONIX) 40 MG tablet Take 80 mg by mouth 2 (two) times daily.     propranolol (INDERAL) 10 MG tablet Take 10 mg by mouth See admin instructions. Take 10 mg by mouth at 6 AM, 12 Noon, and 6 PM     rizatriptan (MAXALT) 10 MG tablet Take 10 mg by mouth as needed for migraine (and may repeat once in 2 hours, if no relief).     rosuvastatin (CRESTOR) 10 MG tablet Take 10 mg by mouth at bedtime.     spironolactone (ALDACTONE) 50 MG tablet Take 100  mg by mouth daily at 12 noon.     zolpidem (AMBIEN CR) 12.5 MG CR tablet Take 12.5 mg by mouth at bedtime.      Results for orders placed or performed during the hospital encounter of 10/28/21 (from the past 48 hour(s))  SARS Coronavirus 2 by RT PCR (hospital order, performed in [facility] hospital lab) Nasopharyngeal Nasopharyngeal Swab     Status: None   Collection Time: 10/28/21  3:15 PM   Specimen: Nasopharyngeal Swab  Result Value Ref Range   SARS Coronavirus 2 NEGATIVE NEGATIVE    Comment: (NOTE) SARS-CoV-2 target nucleic acids are NOT DETECTED.  The SARS-CoV-2 RNA is generally detectable in upper and lower respiratory specimens during the acute phase of infection. The lowest concentration of SARS-CoV-2 viral copies this assay can detect is 250 copies / mL. A negative result does not preclude SARS-CoV-2 infection and should not be used as the sole basis for treatment or other patient management decisions.  A negative result may occur with improper specimen collection / handling, submission of specimen other than nasopharyngeal swab, presence of viral mutation(s) within the areas targeted by this assay, and inadequate number of viral copies (<250 copies / mL). A negative result must be combined with clinical observations, patient history, and epidemiological information.  Fact Sheet for Patients:   StrictlyIdeas.no  Fact Sheet for Healthcare Providers: BankingDealers.co.za  This test is not yet approved or  cleared by the [place] FDA and has been authorized for detection and/or diagnosis of SARS-CoV-2 by FDA under an Emergency Use Authorization (EUA).  This EUA will remain in effect (meaning this test can be used) for the duration of the COVID-19 declaration under Section 564(b)(1) of the Act, 21 U.S.C. section 360bbb-3(b)(1), unless the authorization is terminated or revoked sooner.  Performed at [facility], [place] [address] [place], [place] [address]   Pregnancy, Urine     Status: None   Collection Time: 10/28/21  3:17 PM  Result Value Ref Range   Preg Test, Ur NEGATIVE NEGATIVE    Comment:        THE SENSITIVITY OF THIS METHODOLOGY IS >20 mIU/mL. Performed at [facility], [place] [address] [place],  [address]   CBC     Status: None   Collection Time: 10/28/21  4:16 PM  Result Value Ref Range   WBC 8.0 4.0 - 10.5 K/uL   RBC 4.10 3.87 -  5.11 MIL/uL   Hemoglobin 12.5 12.0 - 15.0 g/dL   HCT 36.1 36.0 - 46.0 %   MCV 88.0 80.0 - 100.0 fL   MCH 30.5 26.0 - 34.0 pg   MCHC 34.6 30.0 - 36.0 g/dL   RDW 14.2 11.5 - 15.5 %   Platelets 243 150 - 400 K/uL   nRBC 0.0 0.0 - 0.2 %    Comment: Performed at [facility], [place] [address], [place], [place] [address]  Comprehensive metabolic panel     Status: Abnormal   Collection Time: 10/28/21  4:16 PM  Result Value Ref Range   Sodium 138 135 - 145 mmol/L   Potassium 4.0 3.5 - 5.1 mmol/L   Chloride 105 98 - 111 mmol/L   CO2 23 22 - 32 mmol/L   Glucose, Bld 88 70 - 99 mg/dL    Comment: Glucose reference range applies only to samples taken after fasting for at least 8 hours.   BUN 13 6 - 20 mg/dL   Creatinine, Ser 0.72 0.44 - 1.00 mg/dL   Calcium 9.9 8.9 - 10.3 mg/dL   Total Protein 6.9 6.5 - 8.1 g/dL   Albumin 3.7 3.5 - 5.0 g/dL   AST 12 (L) 15 - 41 U/L   ALT 12 0 - 44 U/L   Alkaline Phosphatase 41 38 - 126 U/L   Total Bilirubin 0.5 0.3 - 1.2 mg/dL   GFR, Estimated >60 >60 mL/min    Comment: (NOTE) Calculated using the CKD-EPI Creatinine Equation (2021)    Anion gap 10 5 - 15    Comment: Performed at [facility], [place] [address], [place], [place] [address]   No results found.  Review of Systems  All other systems reviewed and are negative.  Blood pressure 140/90, pulse 69, temperature 98.5 F (36.9 C), temperature source Oral, resp. rate 15, height 5\' 3"  (1.6 m),  weight 61.6 kg, SpO2 100 %. Physical Exam Constitutional:      Appearance: Normal appearance.  HENT:     Head: Normocephalic and atraumatic.  Cardiovascular:     Rate and Rhythm: Normal rate and regular rhythm.     Pulses: Normal pulses.  Pulmonary:     Effort: Pulmonary effort is normal.     Breath sounds: Normal breath sounds.  Musculoskeletal:     Comments: Lateral portal incision site open, no active drainage, no erythema.  No sizable effusion noted of the knee joint.  Tenderness to palpation throughout the entirety of the knee.  Limited range of motion.  Calf is supple.  Neurovascularly intact in her left lower extremity.  Skin:    General: Skin is warm and dry.     Capillary Refill: Capillary refill takes less than 2 seconds.  Neurological:     Mental Status: She is alert.  Psychiatric:        Mood and Affect: Mood normal.        Behavior: Behavior normal.        Thought Content: Thought content normal.        Judgment: Judgment normal.     Assessment/Plan Left knee septic arthritis status post arthroscopic debridement and plica resection  Patient unfortunately does have a left knee septic arthritis.  We brought her into the operating room today for a left knee I&D arthroscopically.  Risks and benefits of surgery were discussed with the patient when we saw her in the office this morning.  Surgical versus nonsurgical management discussed.  We will  we will proceed with surgical management.  She will stay overnight for at least 1 night most likely 2-3.  We will get an ID consult tomorrow morning after surgery.  We will most likely need a PICC line placed.  She will follow-up with [place] in the office in a few weeks.  All questions encouraged and answered for the patient today.  Drue Novel, PA 10/28/2021, 5:23 PM

## 2021-10-28 NOTE — Op Note (Signed)
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 without evidence of active infection ?Procedure left knee arthroscopic irrigation and debridement, debridement of inferior lateral portal site ?Surgeon Valma Cava, MD ?Surgeon Assistant Harl Favor, PA-C ?Anesthesia General ?Estimated blood loss minimal ?Drains 1 medium Hemovac ?Complications none ?Disposition PACU stable ?Intraoperative cultures were obtained ?Postoperative findings initial evaluation for joint revealed a very poorly healed inferior lateral portal hemostat opened portal into the joint no pus or fluid was drained at all cultures were obtained the portal site was then debrided back to healthy tissue arthroscopic valuation revealed minimal evidence of synovitis there was some blood present but minimal there is no evidence of what look like active septic arthritis at this point.  Articular surfaces remain intact added menisci and cruciate ligaments. ? ?Operative details ?Procedure left knee patient was encountered in the holding area correct site identified marked signed appropriate IV started.  To the operating room placed supine position general anesthesia timeout was done prepped with DuraPrep and draped in sterile fashion.  After timeout inferolateral portal was debrided to healthy tissue sharply with a 15 blade.  Hemostat was then utilized to go to the inferolateral portal site into the joint which could be passed easily immediately into the joint there was no purulent material no fluid I will.  Cultures and Gram stain were obtained.  Arthroscopic valuation revealed some mild amount of blood in the joint but no evidence of significant synovitis there was no pannus over the articular cartilage or Cartilage.  Be healthy and appear to be relatively benign.  Intra-articular appearance of the joint.  Menisci remained stable as was her cruciate  ligaments and patellar tracking.  Further evaluation including looking posterior laterally the knee was irrigated with over 9 L of normal saline.  Following this a drain was placed through the inferomedial portal site which had been made through a small stab incision it was then sutured in place to the distal lateral incision incision was then closed deeply with 2 vertical mattresses.  Sensorcaine was placed to the skin.  Sterile dressing was applied drain was hooked to suction and placed in a knee immobilizer.  Awakened taken the operating PACU in stable condition.  Should be stabilized in PACU sepsis overnight observation he will be seen in the morning.  Infectious disease will consult will be obtained IV antibiotics were given tonight 2 g of Ancef. ? ?Of note.  In office superficial culture of the inferior lateral portal site draining on Friday no organisms seen Gram stain was negative culture is pending. ? ?Final intraoperative cultures were obtained and Gram stain prior to given IV antibiotics in the operating room. ?We will get infectious disease consult tomorrow evaluate blood work and make a determination of further treatment. 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  Will await for cultures and blood work that is pending an opinion from infectious disease.  Arthroscopically this did not have the typical appearance of postoperative septic arthritis however it still could be. ?

## 2021-10-28 NOTE — Anesthesia Procedure Notes (Signed)
Procedure Name: LMA Insertion ?Date/Time: 10/28/2021 5:54 PM ?Performed by: Gerald Leitz, CRNA ?Pre-anesthesia Checklist: Patient identified, Patient being monitored, Timeout performed, Emergency Drugs available and Suction available ?Patient Re-evaluated:Patient Re-evaluated prior to induction ?Oxygen Delivery Method: Circle system utilized ?Preoxygenation: Pre-oxygenation with 100% oxygen ?Induction Type: IV induction ?Ventilation: Mask ventilation without difficulty ?LMA: LMA inserted ?LMA Size: 4.0 ?Tube type: Oral ?Number of attempts: 1 ?Placement Confirmation: positive ETCO2 and breath sounds checked- equal and bilateral ?Tube secured with: Tape ?Dental Injury: Teeth and Oropharynx as per pre-operative assessment  ? ? ? ? ?

## 2021-10-28 NOTE — Plan of Care (Signed)
  Problem: Education: Goal: Knowledge of General Education information will improve Description Including pain rating scale, medication(s)/side effects and non-pharmacologic comfort measures Outcome: Progressing   Problem: Coping: Goal: Level of anxiety will decrease Outcome: Progressing   Problem: Elimination: Goal: Will not experience complications related to urinary retention Outcome: Progressing   Problem: Pain Managment: Goal: General experience of comfort will improve Outcome: Progressing   Problem: Safety: Goal: Ability to remain free from injury will improve Outcome: Progressing   

## 2021-10-28 NOTE — Anesthesia Preprocedure Evaluation (Addendum)
Anesthesia Evaluation  ?Patient identified by MRN, date of birth, ID band ?Patient awake ? ? ? ?Reviewed: ?Allergy & Precautions, NPO status , Patient's Chart, lab work & pertinent test results ? ?History of Anesthesia Complications ?(+) PONV ? ?Airway ?Mallampati: I ? ?TM Distance: >3 FB ?Neck ROM: Full ? ? ? Dental ? ?(+) Dental Advisory Given ?  ?Pulmonary ?former smoker,  ?  ?breath sounds clear to auscultation ? ? ? ? ? ? Cardiovascular ?negative cardio ROS ? ? ?Rhythm:Regular Rate:Normal ? ? ?  ?Neuro/Psych ? Headaches, Anxiety Depression Bipolar Disorder H/o Chiari malformation ?  ? GI/Hepatic ?Neg liver ROS, GERD  Controlled,  ?Endo/Other  ?negative endocrine ROS ? Renal/GU ?negative Renal ROS  ? ?  ?Musculoskeletal ? ? Abdominal ?  ?Peds ? Hematology ?negative hematology ROS ?(+)   ?Anesthesia Other Findings ? ? Reproductive/Obstetrics ? ?  ? ? ? ? ? ? ? ? ? ? ? ? ? ?  ?  ? ? ? ? ? ? ? ?Anesthesia Physical ?Anesthesia Plan ? ?ASA: 2 ? ?Anesthesia Plan: General  ? ?Post-op Pain Management: Tylenol PO (pre-op)*  ? ?Induction: Intravenous ? ?PONV Risk Score and Plan: 3 and Ondansetron, Dexamethasone and Scopolamine patch - Pre-op ? ?Airway Management Planned: LMA ? ?Additional Equipment: None ? ?Intra-op Plan:  ? ?Post-operative Plan:  ? ?Informed Consent: I have reviewed the patients History and Physical, chart, labs and discussed the procedure including the risks, benefits and alternatives for the proposed anesthesia with the patient or authorized representative who has indicated his/her understanding and acceptance.  ? ? ? ?Dental advisory given ? ?Plan Discussed with: CRNA and Surgeon ? ?Anesthesia Plan Comments:   ? ? ? ? ? ?Anesthesia Quick Evaluation ? ?

## 2021-10-28 NOTE — Interval H&P Note (Signed)
History and Physical Interval Note: ? ?10/28/2021 ?5:47 PM ? ?Kayla Holden  has presented today for surgery, with the diagnosis of Septic arthritis left knee.  The various methods of treatment have been discussed with the patient and family. After consideration of risks, benefits and other options for treatment, the patient has consented to  Procedure(s) with comments: ?KNEE ARTHROSCOPY WITH IRRIGATION AND DEBRIDEMENT (Left) - 60 ?okay per OR as a surgical intervention.  The patient's history has been reviewed, patient examined, no change in status, stable for surgery.  I have reviewed the patient's chart and labs.  Questions were answered to the patient's satisfaction.   ? ? ?Aryani Daffern ANDREW ? ? ?

## 2021-10-28 NOTE — Transfer of Care (Signed)
Immediate Anesthesia Transfer of Care Note ? ?Patient: Kayla Holden ? ?Procedure(s) Performed: KNEE ARTHROSCOPY WITH IRRIGATION AND DEBRIDEMENT (Left) ? ?Patient Location: PACU ? ?Anesthesia Type:General ? ?Level of Consciousness: drowsy and patient cooperative ? ?Airway & Oxygen Therapy: Patient Spontanous Breathing and Patient connected to face mask oxygen ? ?Post-op Assessment: Report given to RN and Post -op Vital signs reviewed and stable ? ?Post vital signs: Reviewed and stable ? ?Last Vitals:  ?Vitals Value Taken Time  ?BP 128/79 10/28/21 1903  ?Temp 36.4 ?C 10/28/21 1903  ?Pulse 97 10/28/21 1905  ?Resp 16 10/28/21 1905  ?SpO2 100 % 10/28/21 1905  ?Vitals shown include unvalidated device data. ? ?Last Pain:  ?Vitals:  ? 10/28/21 1617  ?TempSrc:   ?PainSc: 8   ?   ? ?Patients Stated Pain Goal: 4 (10/28/21 1617) ? ?Complications: No notable events documented. ?

## 2021-10-29 ENCOUNTER — Encounter (HOSPITAL_COMMUNITY): Payer: Self-pay | Admitting: Specialist

## 2021-10-29 DIAGNOSIS — M009 Pyogenic arthritis, unspecified: Secondary | ICD-10-CM

## 2021-10-29 DIAGNOSIS — M1712 Unilateral primary osteoarthritis, left knee: Secondary | ICD-10-CM | POA: Diagnosis not present

## 2021-10-29 LAB — HIV ANTIBODY (ROUTINE TESTING W REFLEX): HIV Screen 4th Generation wRfx: NONREACTIVE

## 2021-10-29 LAB — C-REACTIVE PROTEIN: CRP: 0.5 mg/dL (ref <1.0)

## 2021-10-29 MED ORDER — VANCOMYCIN HCL 750 MG/150ML IV SOLN
750.0000 mg | Freq: Two times a day (BID) | INTRAVENOUS | Status: DC
Start: 2021-10-30 — End: 2021-10-30
  Administered 2021-10-30: 750 mg via INTRAVENOUS
  Filled 2021-10-29: qty 150

## 2021-10-29 MED ORDER — CEFTRIAXONE SODIUM 2 G IJ SOLR
2.0000 g | INTRAMUSCULAR | Status: DC
  Administered 2021-10-29: 2 g via INTRAVENOUS
  Filled 2021-10-29 (×2): qty 20

## 2021-10-29 MED ORDER — CEFAZOLIN SODIUM-DEXTROSE 2-4 GM/100ML-% IV SOLN
2.0000 g | Freq: Three times a day (TID) | INTRAVENOUS | Status: DC
  Administered 2021-10-29: 2 g via INTRAVENOUS
  Filled 2021-10-29: qty 100

## 2021-10-29 MED ORDER — VANCOMYCIN HCL 1250 MG/250ML IV SOLN
1250.0000 mg | Freq: Once | INTRAVENOUS | Status: AC
  Administered 2021-10-29: 1250 mg via INTRAVENOUS
  Filled 2021-10-29: qty 250

## 2021-10-29 NOTE — Progress Notes (Signed)
Subjective: ?1 Day Post-Op Procedure(s) (LRB): ?KNEE ARTHROSCOPY WITH IRRIGATION AND DEBRIDEMENT (Left) ?Patient reports pain as mild.   ?States that pain is being relatively maintained by p.o. pain medication ?Laying in the bed this morning in no acute distress ? ?Objective: ?Vital signs in last 24 hours: ?Temp:  [97.6 ?F (36.4 ?C)-98.5 ?F (36.9 ?C)] 98 ?F (36.7 ?C) (03/14 1829) ?Pulse Rate:  [69-95] 80 (03/14 0634) ?Resp:  [9-18] 16 (03/14 9371) ?BP: (97-140)/(67-97) 109/72 (03/14 6967) ?SpO2:  [92 %-100 %] 94 % (03/14 0634) ?Weight:  [61.6 kg] 61.6 kg (03/13 1527) ? ?Intake/Output from previous day: ?03/13 0701 - 03/14 0700 ?In: 1970 [P.O.:120; I.V.:1750; IV Piggyback:100] ?Out: 10 [Blood:10] ?Intake/Output this shift: ?Total I/O ?In: 320 [P.O.:120; I.V.:150; IV Piggyback:50] ?Out: 0  ? ?Recent Labs  ?  10/28/21 ?1616 10/28/21 ?2040  ?HGB 12.5 14.1  ? ?Recent Labs  ?  10/28/21 ?1616 10/28/21 ?2040  ?WBC 8.0 7.1  ?RBC 4.10 4.57  ?HCT 36.1 40.9  ?PLT 243 283  ? ?Recent Labs  ?  10/28/21 ?1616 10/28/21 ?2040  ?NA 138 134*  ?K 4.0 3.7  ?CL 105 102  ?CO2 23 23  ?BUN 13 11  ?CREATININE 0.72 0.81  ?GLUCOSE 88 105*  ?CALCIUM 9.9 9.7  ? ?No results for input(s): LABPT, INR in the last 72 hours. ? ?Neurologically intact ?Neurovascular intact ?Sensation intact distally ?Intact pulses distally ?Dorsiflexion/Plantar flexion intact ?No cellulitis present ?Compartment soft ? ?Hemovac drain: About 30 cc of fluid ? ?Assessment/Plan: ?1 Day Post-Op Procedure(s) (LRB): ?KNEE ARTHROSCOPY WITH IRRIGATION AND DEBRIDEMENT (Left) ?Advance diet ?Continue ABX therapy due to Post-op infection ?Culture still pending ?We will be consulting ID for antibiotic treatment status post arthroscopic infection and I&D ?She is going to remain in the knee immobilizer, she is okay for touchdown weightbearing.  She is okay to get up and go to the bathroom go to the chair.  No PT needed. ?Drain is leaving in until minimal production noted ? ? ? ?Patient's  anticipated LOS is less than 2 midnights, meeting these requirements: ?- Younger than 65 ?- Lives within 1 hour of care ?- Has a competent adult at home to recover with post-op recovery ?- NO history of ? - Chronic pain requiring opioids ? - Diabetes ? - Coronary Artery Disease ? - Heart failure ? - Heart attack ? - Stroke ? - DVT/VTE ? - Cardiac arrhythmia ? - Respiratory Failure/COPD ? - Renal failure ? - Anemia ? - Advanced Liver disease ? ? ? ? ?Cherie Dark, PA-C ?[facility] ?[phone] ?10/29/2021, 6:49 AM ? ?

## 2021-10-29 NOTE — Progress Notes (Signed)
Pharmacy Antibiotic Note ? ?Kayla Holden is a 41 y.o. female admitted on 10/28/2021 with septic arthritis.  Pharmacy has been consulted for vancomycin dosing. ? ?Plan: ?Ceftriaxone 2 gm IV q24 ?Vancomycin 1250 mg IV x 1 followed by vancomycin 750 mg IV q12 for est AUC 313.3 using SCr 0.81, Vd 0.72 ?F/u renal function, WBC, temp, culture data ?Vancomycin levels as needed ? ? ?Height: 5\' 3"  (160 cm) ?Weight: 61.6 kg (135 lb 12.9 oz) ?IBW/kg (Calculated) : 52.4 ? ?Temp (24hrs), Avg:97.9 ?F (36.6 ?C), Min:97.6 ?F (36.4 ?C), Max:98.2 ?F (36.8 ?C) ? ?Recent Labs  ?Lab 10/28/21 ?1616 10/28/21 ?2040  ?WBC 8.0 7.1  ?CREATININE 0.72 0.81  ?  ?Estimated Creatinine Clearance: 74.1 mL/min (by C-G formula based on SCr of 0.81 mg/dL).   ? ?Allergies  ?Allergen Reactions  ? Codeine Other (See Comments)  ?  Patient has "CYP2D6," so NO CODEINE  ? Fioricet-Codeine [Butalbital-Apap-Caff-Cod] Other (See Comments)  ?  Patient has "CYP2D6" and cannot tolerate codeine  ? Isoniazid Shortness Of Breath  ? Ketorolac Tromethamine Other (See Comments)  ?  Abdominal Pain  ? Rifampin Shortness Of Breath and Hives  ? Tape Other (See Comments)  ?  Paper tape causes blisters ?  ? Clindamycin Diarrhea and Other (See Comments)  ?  Abdominal pain, also  ? Ketoprofen Other (See Comments)  ?  Abdominal pain and cannot take due to history of colitis  ? Nsaids Other (See Comments)  ?  History of colitis  ? Butalbital-Aspirin-Caffeine Palpitations and Other (See Comments)  ?  Caused rapid heart rate and history of colitis  ? Clindamycin/Lincomycin Other (See Comments)  ?  Abdominal pain  ? ? ?Antimicrobials this admission: ?3/13 cefazolin>>3/14 ?3/14 ceftriaxone> ?3/14 vancomycin> ?Dose adjustments this admission: ? ?Microbiology results: ?3/13 HIV NR ?3/13 L knee synovial : ngtd ?3/13 L knee synovial AFB: sent ? ?Thank you for allowing pharmacy to be a part of this patient?s care. ? ?4/13, Pharm.D ?10/29/2021 3:37 PM ? ?

## 2021-10-29 NOTE — TOC Initial Note (Signed)
Transition of Care (TOC) - Initial/Assessment Note  ? ?Patient Details  ?Name: Kayla Holden ?MRN: [id number] ?Date of Birth: 1981-06-23 ? ?Transition of Care (TOC) CM/SW Contact:    ?Ewing Schlein, LCSW ?Phone Number: ?10/29/2021, 3:11 PM ? ?Clinical Narrative: CSW made tentative IV antibiotics referral to Pam with [facility] as TOC is awaiting cultures to determine if patient will go home with IV or oral antibiotics. TOC to follow. ? ?Expected Discharge Plan: Home w Home Health Services ?Barriers to Discharge: Continued Medical Work up ? ?Patient Goals and CMS Choice ?CMS Medicare.gov Compare Post Acute Care list provided to:: Patient ?Choice offered to / list presented to : Patient ? ?Expected Discharge Plan and Services ?Expected Discharge Plan: Home w Home Health Services ?In-house Referral: Clinical Social Work ?Living arrangements for the past 2 months: Single Family Home            ?DME Arranged: N/A ?DME Agency: NA ?HH Arranged: IV Antibiotics ?HH Agency: [facility] ?Date HH Agency Contacted: 10/29/21 ?Representative spoke with at [facility]: Pam ? ?Prior Living Arrangements/Services ?Living arrangements for the past 2 months: Single Family Home ?Patient language and need for interpreter reviewed:: Yes ?Need for Family Participation in Patient Care: No (Comment) ?Care giver support system in place?: Yes (comment) ?Criminal Activity/Legal Involvement Pertinent to Current Situation/Hospitalization: No - Comment as needed ? ?Activities of Daily Living ?Home Assistive Devices/Equipment: None ?ADL Screening (condition at time of admission) ?Patient's cognitive ability adequate to safely complete daily activities?: Yes ?Is the patient deaf or have difficulty hearing?: No ?Does the patient have difficulty seeing, even when wearing glasses/contacts?: No ?Does the patient have difficulty concentrating, remembering, or making decisions?: No ?Patient able to express need for assistance with ADLs?: Yes ?Does the patient have  difficulty dressing or bathing?: No ?Independently performs ADLs?: Yes (appropriate for developmental age) ?Does the patient have difficulty walking or climbing stairs?: Yes ?Weakness of Legs: Left ?Weakness of Arms/Hands: None ? ?Emotional Assessment ?Orientation: : Oriented to Self, Oriented to Place, Oriented to  Time, Oriented to Situation ?Alcohol / Substance Use: Not Applicable ? ?Admission diagnosis:  Septic arthritis of knee, left (HCC) [M00.9] ?Patient Active Problem List  ? Diagnosis Date Noted  ? Septic arthritis of knee, left (HCC) 10/28/2021  ? Pain in joint, ankle and foot 07/09/2017  ? MDD (major depressive disorder) 07/06/2017  ? Lymphocytic colitis 08/22/2016  ? Conversion disorder 06/20/2015  ? Chronic daily headache 05/09/2015  ? ?PCP:  Marylen Ponto, MD ?Pharmacy:   ?[facility] - [place], [place] - [address] ?[address] ?[place] [address] ?Phone: [phone] Fax: [phone] ? ?Readmission Risk Interventions ?No flowsheet data found. ? ?

## 2021-10-29 NOTE — Consult Note (Addendum)
?  [facility]  Total days of antibiotics 2/cefazolin ?Reason for Consult:left knee septic arthritis    ?Referring Physician: collins  ? ?Principal Problem: ?  Septic arthritis of knee, left (HCC) ? ? ? ?HPI: Kayla Holden is a 41 y.o. female who underwent left knee arthroscopy partial medial meniscectomy and plica resection on 09/24/2021. She did well the first 3 weeks post surgery but then started to note increasing "crunching sound" to her knee with flexion, followed with associated pain. She noted some brownish, greenish exudate from lateral port for which she was started on a 10d course of cephalexin. Due to increasing pain, she also had separate visit for steroid injection. She had minimal response, thus underwent MRI, as well as swab cx of drainage from port, unable to aspirate due to minimal fluid. Due to MRI findings, and her clinical presentation, she was admitted on 3/13 for I X D- gas production on MRI? OR report commented on very poorly healed inferior lateral portal. Minimal evidence of synovitis.joint, menisci, ligament appeared stable. Drain placed in inferomedial portal site. 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 The patient denies fever or chills but has pain, nausea - feeling flushed from pain. Office cx of portal site on 3/10 NGTD ? 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 ?Past Medical History:  ?Diagnosis Date  ? Anxiety   ? Anxiety   ? Bipolar 1 disorder (HCC)   ? Chiari malformation   ? Complication of anesthesia   ? Depression   ? GERD (gastroesophageal reflux disease)   ? MDD (major depressive disorder)   ? Microscopic colitis   ? Migraine headache   ? PONV (postoperative nausea and vomiting)   ? ? ?Allergies:  ?Allergies  ?Allergen Reactions  ? Codeine Other (See Comments)  ?  Patient has "CYP2D6," so NO CODEINE  ? Fioricet-Codeine  [Butalbital-Apap-Caff-Cod] Other (See Comments)  ?  Patient has "CYP2D6" and cannot tolerate codeine  ? Isoniazid Shortness Of Breath  ? Ketorolac Tromethamine Other (See Comments)  ?  Abdominal Pain  ? Rifampin Shortness Of Breath and Hives  ? Tape Other (See Comments)  ?  Paper tape causes blisters ?  ? Clindamycin Diarrhea and Other (See Comments)  ?  Abdominal pain, also  ? Ketoprofen Other (See Comments)  ?  Abdominal pain and cannot take due to history of colitis  ? Nsaids Other (See Comments)  ?  History of colitis  ? Butalbital-Aspirin-Caffeine Palpitations and Other (See Comments)  ?  Caused rapid heart rate and history of colitis  ? Clindamycin/Lincomycin Other (See Comments)  ?  Abdominal pain  ? ?MEDICATIONS: ? acetaminophen  1,000 mg Oral Q6H  ? enoxaparin (LOVENOX) injection  40 mg Subcutaneous Q24H  ? LORazepam  1 mg Oral Q6H  ? lurasidone  40 mg Oral q1800  ? pantoprazole  80 mg Oral BID  ? propranolol  10 mg Oral TID  ? rosuvastatin  10 mg Oral QHS  ? spironolactone  100 mg Oral Q1200  ? traMADol  50 mg Oral Q6H  ? ? ?Social History  ? ?Tobacco Use  ? Smoking status: Former  ?  Types: Cigarettes  ? Smokeless tobacco: Never  ?Vaping Use  ? Vaping Use: Never used  ?Substance Use Topics  ? Alcohol use: No  ? Drug use: Yes  ?  Types: Marijuana  ?  Comment: has not smoked in last 3 days  ? ? ?Family History  ?Problem Relation Age of Onset  ? Cancer Maternal Grandmother   ? Cancer Paternal Grandfather   ? Allergic rhinitis Neg Hx   ? Angioedema Neg Hx   ? Asthma Neg Hx   ? Atopy Neg Hx   ? Eczema Neg Hx   ? Urticaria Neg Hx   ? Immunodeficiency Neg Hx   ? ? ? ?Review of Systems  ?Constitutional: Negative for fever, chills, diaphoresis, activity change, appetite change, fatigue and unexpected weight change.  ?HENT: Negative for congestion, sore throat, rhinorrhea, sneezing, trouble swallowing and sinus pressure.  ?Eyes: Negative for photophobia and visual disturbance.  ?Respiratory: Negative for cough,  chest tightness, shortness of breath, wheezing and stridor.  ?Cardiovascular: Negative for chest pain, palpitations and leg swelling.  ?Gastrointestinal: Negative for nausea, vomiting, abdominal pain, diarrhea, constipation, blood in stool, abdominal distention and anal bleeding.  ?Genitourinary: Negative for dysuria, hematuria, flank pain and difficulty urinating.  ?Musculoskeletal: + left knee. Negative for myalgias, back pain, joint swelling, arthralgias and gait problem.  ?Skin: Negative for color change, pallor, rash and wound.  ?Neurological: Negative for dizziness, tremors, weakness and light-headedness.  ?Hematological: Negative for adenopathy. Does not bruise/bleed easily.  ?Psychiatric/Behavioral: Negative for behavioral problems, confusion, sleep disturbance, dysphoric mood, decreased concentration and agitation.  ? ? ?OBJECTIVE: ?Temp:  [97.6 ?F (36.4 ?C)-98.5 ?F (36.9 ?C)] 98.2 ?F (36.8 ?C) (03/14 1000) ?Pulse Rate:  [69-95] 81 (03/14 1000) ?Resp:  [9-18] 18 (03/14 1000) ?BP: (97-140)/(67-97) 117/80 (03/14 1000) ?SpO2:  [92 %-100 %] 100 % (03/14 1000) ?Weight:  [61.6 kg] 61.6 kg (03/13 1527) ?Physical Exam  ?Constitutional:  oriented to person, place, and time. appears well-developed and well-nourished. No distress.  ?HENT: [place]/AT, PERRLA, no scleral icterus. Flushed cheeks but no rash to torso ?Mouth/Throat: Oropharynx is clear and moist. No oropharyngeal exudate.  ?Cardiovascular: Normal rate, regular rhythm and normal heart sounds. Exam reveals no gallop and no friction rub.  ?No murmur heard.  ?Pulmonary/Chest: Effort normal and breath sounds normal. No respiratory distress.  has no wheezes.  ?Neck = supple, no nuchal rigidity ?Abdominal: Soft. Bowel sounds are normal.  exhibits no distension. There is no tenderness.  ?ZOX:WRUE knee is wrapped/iced ?Neurological: alert and oriented to person, place, and time.  ?Skin: Skin is warm and dry. No rash noted. No erythema.  ?Psychiatric: a normal mood and  affect.  behavior is normal.  ? ? ?LABS: ?Results for orders placed or performed during the hospital encounter of 10/28/21 (from the past 48 hour(s))  ?SARS Coronavirus 2 by RT PCR (hospital order, performed in [facility] hospital lab) Nasopharyngeal Nasopharyngeal Swab     Status: None  ? Collection Time: 10/28/21  3:15 PM  ? Specimen: Nasopharyngeal Swab  ?Result Value Ref Range  ? SARS Coronavirus 2 NEGATIVE NEGATIVE  ?  Comment: (NOTE) ?SARS-CoV-2 target nucleic acids are NOT DETECTED. ? ?The SARS-CoV-2 RNA is generally detectable in upper and lower ?respiratory specimens during the acute phase of infection. The lowest ?concentration of SARS-CoV-2 viral copies this assay can detect is 250 ?copies / mL. A negative result does not preclude SARS-CoV-2 infection ?and should not be used as the sole basis for treatment or other ?patient management decisions.  A negative result may occur with ?improper specimen collection / handling, submission of specimen other ?than nasopharyngeal swab, presence of viral mutation(s) within the ?areas targeted by this assay, and inadequate number of viral copies ?(<250 copies /  mL). A negative result must be combined with clinical ?observations, patient history, and epidemiological information. ? ?Fact Sheet for Patients:   ?BoilerBrush.com.cy ? ?Fact Sheet for Healthcare Providers: ?https://pope.com/ ? ?This test is not yet approved or  cleared by the [place] FDA and ?has been authorized for detection and/or diagnosis of SARS-CoV-2 by ?FDA under an Emergency Use Authorization (EUA).  This EUA will remain ?in effect (meaning this test can be used) for the duration of the ?COVID-19 declaration under Section 564(b)(1) of the Act, 21 U.S.C. ?section 360bbb-3(b)(1), unless the authorization is terminated or ?revoked sooner. ? ?Performed at [facility], [address] ?[place], [place] [address] ?  ?Pregnancy, Urine      Status: None  ? Collection Time: 10/28/21  3:17 PM  ?Result Value Ref Range  ? Preg Test, Ur NEGATIVE NEGATIVE  ?  Comment:        ?THE SENSITIVITY OF THIS ?METHODOLOGY IS >20 mIU/mL. ?Performed at [facility]

## 2021-10-29 NOTE — Progress Notes (Signed)
PHARMACY NOTE:  ANTIMICROBIAL RENAL DOSAGE ADJUSTMENT ? ?Current antimicrobial regimen includes a mismatch between antimicrobial dosage and estimated renal function.  As per policy approved by the Pharmacy & Therapeutics and Medical Executive Committees, the antimicrobial dosage will be adjusted accordingly. ? ?Current antimicrobial dosage:  Ancef 1gm IV q6h ? ?Indication: septic arthritis/wound infection ? ?Renal Function: ? ?Estimated Creatinine Clearance: 74.1 mL/min (by C-G formula based on SCr of 0.81 mg/dL). ?[]      On intermittent HD, scheduled: ?[]      On CRRT ?   ?Antimicrobial dosage has been changed to:  Ancef 2gm IV q8h ? ?Additional comments: ? ? ?Thank you for allowing pharmacy to be a part of this patient's care. ? ?Netta Cedars, [facility] ?10/29/2021 3:02 AM ? ? ?   ?

## 2021-10-29 NOTE — Plan of Care (Signed)
  Problem: Activity: Goal: Risk for activity intolerance will decrease Outcome: Progressing   Problem: Pain Managment: Goal: General experience of comfort will improve Outcome: Progressing   Problem: Safety: Goal: Ability to remain free from injury will improve Outcome: Progressing   

## 2021-10-30 DIAGNOSIS — M1712 Unilateral primary osteoarthritis, left knee: Secondary | ICD-10-CM | POA: Diagnosis not present

## 2021-10-30 LAB — ACID FAST SMEAR (AFB, MYCOBACTERIA): Acid Fast Smear: NEGATIVE

## 2021-10-30 MED ORDER — DOXYCYCLINE HYCLATE 100 MG PO CAPS: 100.0000 mg | ORAL_CAPSULE | Freq: Two times a day (BID) | ORAL | 0 refills | Status: AC

## 2021-10-30 MED ORDER — HYDROMORPHONE HCL 2 MG PO TABS: 2.0000 mg | ORAL_TABLET | ORAL | 0 refills | Status: AC | PRN

## 2021-10-30 MED ORDER — ONDANSETRON HCL 4 MG PO TABS
4.0000 mg | ORAL_TABLET | Freq: Four times a day (QID) | ORAL | 0 refills | Status: DC | PRN
Start: 2021-10-30 — End: 2022-03-11

## 2021-10-30 MED ORDER — METHOCARBAMOL 500 MG PO TABS: 500.0000 mg | ORAL_TABLET | Freq: Four times a day (QID) | ORAL | 0 refills | Status: DC | PRN

## 2021-10-30 MED ORDER — AMOXICILLIN-POT CLAVULANATE 875-125 MG PO TABS: 1.0000 | ORAL_TABLET | Freq: Two times a day (BID) | ORAL | 0 refills | Status: AC

## 2021-10-30 MED ORDER — OXYCODONE HCL 10 MG PO TABS: 10.0000 mg | ORAL_TABLET | ORAL | 0 refills | Status: DC | PRN

## 2021-10-30 NOTE — Plan of Care (Signed)
  Problem: Pain Managment: Goal: General experience of comfort will improve Outcome: Progressing   Problem: Safety: Goal: Ability to remain free from injury will improve Outcome: Progressing   

## 2021-10-30 NOTE — Progress Notes (Signed)
Subjective: ?2 Days Post-Op Procedure(s) (LRB): ?KNEE ARTHROSCOPY WITH IRRIGATION AND DEBRIDEMENT (Left) ?Patient reports pain as moderate.  It is being well managed on IV dilaudid and PO oxy ?Was seen by ID yesterday and they are giving her abx.  ?She is overall doing well ? ?Objective: ?Vital signs in last 24 hours: ?Temp:  [97.9 ?F (36.6 ?C)-98.7 ?F (37.1 ?C)] 97.9 ?F (36.6 ?C) (03/15 [id number]) ?Pulse Rate:  [68-81] 75 (03/15 0521) ?Resp:  [16-18] 16 (03/15 0521) ?BP: (102-117)/(72-80) 112/75 (03/15 0521) ?SpO2:  [98 %-100 %] 98 % (03/15 0521) ? ?Intake/Output from previous day: ?03/14 0701 - 03/15 0700 ?In: [id number] [P.O.:1080; I.V.:588; IV Piggyback:150] ?Out: -  ?Intake/Output this shift: ?No intake/output data recorded. ? ?Recent Labs  ?  10/28/21 ?1616 10/28/21 ?2040  ?HGB 12.5 14.1  ? ?Recent Labs  ?  10/28/21 ?1616 10/28/21 ?2040  ?WBC 8.0 7.1  ?RBC 4.10 4.57  ?HCT 36.1 40.9  ?PLT 243 283  ? ?Recent Labs  ?  10/28/21 ?1616 10/28/21 ?2040  ?NA 138 134*  ?K 4.0 3.7  ?CL 105 102  ?CO2 23 23  ?BUN 13 11  ?CREATININE 0.72 0.81  ?GLUCOSE 88 105*  ?CALCIUM 9.9 9.7  ? ?No results for input(s): LABPT, INR in the last 72 hours. ? ?Neurologically intact ?Neurovascular intact ?Sensation intact distally ?Intact pulses distally ?Dorsiflexion/Plantar flexion intact ?Incision: dressing C/D/I ?No cellulitis present ?Compartment soft ? ?Hemovac dain: 20 cc at time of removal, all bloody appearing fluid ?Assessment/Plan: ?2 Days Post-Op Procedure(s) (LRB): ?KNEE ARTHROSCOPY WITH IRRIGATION AND DEBRIDEMENT (Left) ?Advance diet ?Continue ABX therapy due to Post-op infection ?D/C today ?Home with PO abx per ID and they will continue to follow cultures, none grown out so far ?Continue in knee immobilizer ?New dressings applied ?Follow up in the office in 1 week ?ASA 81 mg BID for dvt ppx ? ? ? ?Patient's anticipated LOS is less than 2 midnights, meeting these requirements: ?- Younger than 55 ?- Lives within 1 hour of care ?- Has a  competent adult at home to recover with post-op recover ?- NO history of ? - Chronic pain requiring opiods ? - Diabetes ? - Coronary Artery Disease ? - Heart failure ? - Heart attack ? - Stroke ? - DVT/VTE ? - Cardiac arrhythmia ? - Respiratory Failure/COPD ? - Renal failure ? - Anemia ? - Advanced Liver disease ? ? ? ? ?Drue Novel, PA-C ?[facility] ?[phone] ?10/30/2021, 7:40 AM ? ?

## 2021-10-30 NOTE — TOC Transition Note (Signed)
Transition of Care (TOC) - CM/SW Discharge Note ? ?Patient Details  ?Name: Kayla Holden ?MRN: [id number] ?Date of Birth: 1980/12/10 ? ?Transition of Care (TOC) CM/SW Contact:  ?Ewing Schlein, LCSW ?Phone Number: ?10/30/2021, 10:07 AM ? ?Clinical Narrative: CSW notified patient will not require IV antibiotics or a PICC line. CSW updated patient and Pam with [facility]. TOC signing off. ? ?Final next level of care: Home/Self Care ?Barriers to Discharge: Barriers Resolved ? ?Patient Goals and CMS Choice ?CMS Medicare.gov Compare Post Acute Care list provided to:: Patient ?Choice offered to / list presented to : NA ? ?Discharge Plan and Services ?In-house Referral: Clinical Social Work        ?DME Arranged: N/A ?DME Agency: NA ? ?Readmission Risk Interventions ?No flowsheet data found. ? ?

## 2021-10-30 NOTE — Progress Notes (Signed)
Discharge package printed and instructions given to patient. Verbalizes understanding.  

## 2021-10-30 NOTE — Discharge Summary (Signed)
Physician Discharge Summary  ?Patient ID: ?Kayla Holden ?MRN: [id number] ?DOB/AGE: 43-Jun-1982 43 43 y.o. ? ?Admit date: 10/28/2021 ?Discharge date: 10/30/2021 ? ?Admission Diagnoses: ?Left knee septic arthritis, status post left knee arthroscopy and plica resection ?Discharge Diagnoses:  ?Principal Problem: ?  Septic arthritis of knee, left (HCC) ? ? ?Discharged Condition: stable ? ?Hospital Course: Patient was admitted on March 13 for a left knee septic arthritis status post left knee arthroscopy and plica resection.  Patient is about 5 weeks out from surgery.  She was taken to the operating room in stable condition.  Cultures were taken.  Knee was irrigated and debrided.  She was sent up to PACU in stable condition.  No events overnight.  Postop day 1 patient doing well, drain still in still producing about 30 cc of fluid.  At this time no cultures have grown anything.  Infectious disease was consulted.  They recommend p.o. antibiotics for the time being.  Dr. Ilsa Iha saw the patient.  Patient doing well overnight.  Postop day 2 drain removed.  New dressings applied.  Patient is getting ready for discharge.  Sent home with p.o. pain medication, nausea medication and muscle relaxer.  Dr. Ilsa Iha will be sending in antibiotics.  She will follow-up with [place] in the office in 1 week.  DVT prophylaxis 81 mg twice daily.  She will remain in the knee immobilizer for probably about 2 to 3 weeks. ? ?Consults: ID ? ?Significant Diagnostic Studies: labs: WNL and microbiology: wound culture: Negative, but still pending ? ?Treatments: IV hydration, antibiotics: Ancef, vancomycin, and ceftriaxone, analgesia: acetaminophen, Dilaudid, and oxycodone, anticoagulation: LMW heparin, and surgery: left knee arthroscopic I&D ? ?Discharge Exam: ?Blood pressure 112/75, pulse 75, temperature 97.9 ?F (36.6 ?C), temperature source Oral, resp. rate 16, height 5\' 3"  (1.6 m), weight 61.6 kg, SpO2 98 %. ?General appearance: alert, cooperative, appears  stated age, and no distress ?Extremities: extremities normal, atraumatic, no cyanosis or edema and Homans sign is negative, no sign of DVT ?Pulses: 2+ and symmetric ?Skin: Skin color, texture, turgor normal. No rashes or lesions ?Incision/Wound: ? ?Disposition:  ? ? ?Allergies as of 10/30/2021   ? ?   Reactions  ? Codeine Other (See Comments)  ? Patient has "CYP2D6," so NO CODEINE  ? Fioricet-codeine [butalbital-apap-caff-cod] Other (See Comments)  ? Patient has "CYP2D6" and cannot tolerate codeine  ? Isoniazid Shortness Of Breath  ? Ketorolac Tromethamine Other (See Comments)  ? Abdominal Pain  ? Rifampin Shortness Of Breath, Hives  ? Tape Other (See Comments)  ? Paper tape causes blisters  ? Clindamycin Diarrhea, Other (See Comments)  ? Abdominal pain, also  ? Ketoprofen Other (See Comments)  ? Abdominal pain and cannot take due to history of colitis  ? Nsaids Other (See Comments)  ? History of colitis  ? Butalbital-aspirin-caffeine Palpitations, Other (See Comments)  ? Caused rapid heart rate and history of colitis  ? Clindamycin/lincomycin Other (See Comments)  ? Abdominal pain  ? ?  ? ?  ?Medication List  ?  ? ?TAKE these medications   ? ?dicyclomine 20 MG tablet ?Commonly known as: BENTYL ?Take 40 mg by mouth 4 (four) times daily as needed for spasms. ?  ?diphenoxylate-atropine 2.5-0.025 MG tablet ?Commonly known as: LOMOTIL ?Take 1 tablet by mouth 4 (four) times daily as needed for diarrhea or loose stools. ?  ?drospirenone-ethinyl estradiol 3-0.02 MG tablet ?Commonly known as: YAZ ?Take 1 tablet by mouth daily with lunch. ?  ?HYDROmorphone 2 MG tablet ?Commonly known  as: Dilaudid ?Take 1 tablet (2 mg total) by mouth every 4 (four) hours as needed for up to 7 days for severe pain. ?  ?Latuda 40 MG Tabs tablet ?Generic drug: lurasidone ?Take 40 mg by mouth See admin instructions. Take 40 mg by mouth at 6 PM ?  ?LORazepam 1 MG tablet ?Commonly known as: ATIVAN ?Take 1 mg by mouth every 6 (six) hours. ?  ?Marinol  5 MG capsule ?Generic drug: dronabinol ?Take 5 mg by mouth 2 (two) times daily as needed (for nausea or appetite). ?  ?methocarbamol 500 MG tablet ?Commonly known as: ROBAXIN ?Take 1 tablet (500 mg total) by mouth every 6 (six) hours as needed for muscle spasms. ?  ?ondansetron 4 MG tablet ?Commonly known as: ZOFRAN ?Take 1 tablet (4 mg total) by mouth every 6 (six) hours as needed for nausea. ?  ?ondansetron 8 MG disintegrating tablet ?Commonly known as: ZOFRAN-ODT ?Take 8 mg by mouth every 8 (eight) hours as needed for nausea (DISSOLVE ORALLY). ?  ?Oxycodone HCl 10 MG Tabs ?Take 1-1.5 tablets (10-15 mg total) by mouth every 4 (four) hours as needed for severe pain (pain score 7-10). ?  ?pantoprazole 40 MG tablet ?Commonly known as: PROTONIX ?Take 80 mg by mouth 2 (two) times daily. ?  ?propranolol 10 MG tablet ?Commonly known as: INDERAL ?Take 10 mg by mouth See admin instructions. Take 10 mg by mouth at 6 AM, 12 Noon, and 6 PM ?  ?rizatriptan 10 MG tablet ?Commonly known as: MAXALT ?Take 10 mg by mouth as needed for migraine (and may repeat once in 2 hours, if no relief). ?  ?rosuvastatin 10 MG tablet ?Commonly known as: CRESTOR ?Take 10 mg by mouth at bedtime. ?  ?spironolactone 50 MG tablet ?Commonly known as: ALDACTONE ?Take 100 mg by mouth daily at 12 noon. ?  ?zolpidem 12.5 MG CR tablet ?Commonly known as: AMBIEN CR ?Take 12.5 mg by mouth at bedtime. ?  ? ?  ? ? ? ?Signed: ?Cherie Dark, PA-C ?[facility] ?[phone] ?10/30/2021, 7:50 AM ? ? ?

## 2021-11-02 LAB — AEROBIC/ANAEROBIC CULTURE W GRAM STAIN (SURGICAL/DEEP WOUND): Culture: NO GROWTH

## 2021-11-14 ENCOUNTER — Ambulatory Visit (INDEPENDENT_AMBULATORY_CARE_PROVIDER_SITE_OTHER): Payer: PPO | Admitting: Internal Medicine

## 2021-11-14 ENCOUNTER — Other Ambulatory Visit: Payer: Self-pay

## 2021-11-14 ENCOUNTER — Encounter: Payer: Self-pay | Admitting: Internal Medicine

## 2021-11-14 VITALS — BP 116/79 | HR 97 | Temp 98.2°F | Wt 138.0 lb

## 2021-11-14 DIAGNOSIS — M009 Pyogenic arthritis, unspecified: Secondary | ICD-10-CM | POA: Diagnosis not present

## 2021-11-14 NOTE — Progress Notes (Signed)
? ? ?Patient ID: Kayla Holden, female   DOB: July 12, 1981, 41 y.o.   MRN: [id number] ? ?HPI ?Kayla Holden is 41yo F  with left knee arthroscopy for partial medial meniscectomy and plica resection on 09/24/2021 c/b arthrocutaneous fistula secondary to inadequate healing +/- infection--> cultures were negative thus treated as SSTI due inadequate healing of one of the ports. She finished doxy and augmentin. And Starts physical therapy tomorrow, and ?No yeast infection ? ? ?Outpatient Encounter Medications as of 11/14/2021  ?Medication Sig  ? dicyclomine (BENTYL) 20 MG tablet Take 40 mg by mouth 4 (four) times daily as needed for spasms.  ? diphenoxylate-atropine (LOMOTIL) 2.5-0.025 MG tablet Take 1 tablet by mouth 4 (four) times daily as needed for diarrhea or loose stools.  ? dronabinol (MARINOL) 5 MG capsule Take 5 mg by mouth 2 (two) times daily as needed (for nausea or appetite).  ? drospirenone-ethinyl estradiol (YAZ) 3-0.02 MG tablet Take 1 tablet by mouth daily with lunch.  ? LATUDA 40 MG TABS tablet Take 40 mg by mouth See admin instructions. Take 40 mg by mouth at 6 PM  ? LORazepam (ATIVAN) 1 MG tablet Take 1 mg by mouth every 6 (six) hours.  ? methocarbamol (ROBAXIN) 500 MG tablet Take 1 tablet (500 mg total) by mouth every 6 (six) hours as needed for muscle spasms.  ? ondansetron (ZOFRAN) 4 MG tablet Take 1 tablet (4 mg total) by mouth every 6 (six) hours as needed for nausea.  ? ondansetron (ZOFRAN-ODT) 8 MG disintegrating tablet Take 8 mg by mouth every 8 (eight) hours as needed for nausea (DISSOLVE ORALLY).  ? oxyCODONE 10 MG TABS Take 1-1.5 tablets (10-15 mg total) by mouth every 4 (four) hours as needed for severe pain (pain score 7-10).  ? pantoprazole (PROTONIX) 40 MG tablet Take 80 mg by mouth 2 (two) times daily.  ? propranolol (INDERAL) 10 MG tablet Take 10 mg by mouth See admin instructions. Take 10 mg by mouth at 6 AM, 12 Noon, and 6 PM  ? rizatriptan (MAXALT) 10 MG tablet Take 10 mg by mouth as needed  for migraine (and may repeat once in 2 hours, if no relief).  ? rosuvastatin (CRESTOR) 10 MG tablet Take 10 mg by mouth at bedtime.  ? spironolactone (ALDACTONE) 50 MG tablet Take 100 mg by mouth daily at 12 noon.  ? zolpidem (AMBIEN CR) 12.5 MG CR tablet Take 12.5 mg by mouth at bedtime.  ? ?No facility-administered encounter medications on file as of 11/14/2021.  ?  ? ?Patient Active Problem List  ? Diagnosis Date Noted  ? Septic arthritis of knee, left (HCC) 10/28/2021  ? Pain in joint, ankle and foot 07/09/2017  ? MDD (major depressive disorder) 07/06/2017  ? Lymphocytic colitis 08/22/2016  ? Conversion disorder 06/20/2015  ? Chronic daily headache 05/09/2015  ? ? ? ?Health Maintenance Due  ?Topic Date Due  ? COVID-19 Vaccine (1) Never done  ? Hepatitis C Screening  Never done  ? TETANUS/TDAP  Never done  ? PAP SMEAR-Modifier  Never done  ?  ? ?Review of Systems ? 12 point ros is negative  ?Physical Exam  ? ?BP 116/79   Pulse 97   Temp 98.2 ?F (36.8 ?C) (Oral)   Wt 138 lb (62.6 kg)   BMI 24.45 kg/m?   ?Gen = a x o by 3 in NAD ?Ext= left knee healing well. ? ?CBC ?Lab Results  ?Component Value Date  ? WBC 7.1 10/28/2021  ? RBC  4.57 10/28/2021  ? HGB 14.1 10/28/2021  ? HCT 40.9 10/28/2021  ? PLT 283 10/28/2021  ? MCV 89.5 10/28/2021  ? MCH 30.9 10/28/2021  ? MCHC 34.5 10/28/2021  ? RDW 14.1 10/28/2021  ? ? ?BMET ?Lab Results  ?Component Value Date  ? NA 134 (L) 10/28/2021  ? K 3.7 10/28/2021  ? CL 102 10/28/2021  ? CO2 23 10/28/2021  ? GLUCOSE 105 (H) 10/28/2021  ? BUN 11 10/28/2021  ? CREATININE 0.81 10/28/2021  ? CALCIUM 9.7 10/28/2021  ? GFRNONAA >60 10/28/2021  ? GFRAA >60 11/28/2019  ? ?Lab Results  ?Component Value Date  ? ESRSEDRATE 17 11/14/2021  ? ?Lab Results  ?Component Value Date  ? CRP 3.6 11/14/2021  ? ? ? ? ?Assessment and Plan ?Ssti related to left knee medial meniscotomy and plica resection = will check and monitor off of abtx ?-sed rate - crp -  ? ? ? ? ?

## 2021-11-15 DIAGNOSIS — M25562 Pain in left knee: Secondary | ICD-10-CM | POA: Diagnosis not present

## 2021-11-15 LAB — CBC WITH DIFFERENTIAL/PLATELET
Absolute Monocytes: 302 cells/uL (ref 200–950)
Basophils Absolute: 11 cells/uL (ref 0–200)
Basophils Relative: 0.2 %
Eosinophils Absolute: 22 cells/uL (ref 15–500)
Eosinophils Relative: 0.4 %
HCT: 37.6 % (ref 35.0–45.0)
Hemoglobin: 12.6 g/dL (ref 11.7–15.5)
Lymphs Abs: 2666 cells/uL (ref 850–3900)
MCH: 30.5 pg (ref 27.0–33.0)
MCHC: 33.5 g/dL (ref 32.0–36.0)
MCV: 91 fL (ref 80.0–100.0)
MPV: 10.5 fL (ref 7.5–12.5)
Monocytes Relative: 5.4 %
Neutro Abs: 2598 cells/uL (ref 1500–7800)
Neutrophils Relative %: 46.4 %
Platelets: 293 10*3/uL (ref 140–400)
RBC: 4.13 10*6/uL (ref 3.80–5.10)
RDW: 13.1 % (ref 11.0–15.0)
Total Lymphocyte: 47.6 %
WBC: 5.6 10*3/uL (ref 3.8–10.8)

## 2021-11-15 LAB — C-REACTIVE PROTEIN: CRP: 3.6 mg/L (ref <8.0)

## 2021-11-15 LAB — SEDIMENTATION RATE: Sed Rate: 17 mm/h (ref 0–20)

## 2021-11-27 DIAGNOSIS — M25562 Pain in left knee: Secondary | ICD-10-CM | POA: Diagnosis not present

## 2021-12-02 DIAGNOSIS — M25562 Pain in left knee: Secondary | ICD-10-CM | POA: Diagnosis not present

## 2021-12-05 ENCOUNTER — Ambulatory Visit (INDEPENDENT_AMBULATORY_CARE_PROVIDER_SITE_OTHER): Payer: PPO | Admitting: Internal Medicine

## 2021-12-05 ENCOUNTER — Other Ambulatory Visit: Payer: Self-pay

## 2021-12-05 ENCOUNTER — Encounter: Payer: Self-pay | Admitting: Internal Medicine

## 2021-12-05 VITALS — BP 124/80 | HR 76 | Temp 98.5°F | Resp 16 | Wt 141.0 lb

## 2021-12-05 DIAGNOSIS — M009 Pyogenic arthritis, unspecified: Secondary | ICD-10-CM

## 2021-12-05 NOTE — Progress Notes (Signed)
ID FOLLOW UP ? ? ?Patient ID: Kayla Holden, female   DOB: [birth year], 41 y.o.   MRN: [id number] ? ?HPI ?41yo F with left knee arthroscopy for partial medial meniscectomy and plica resection on 09/24/2021 c/b arthrocutaneous fistula secondary to inadequate healing +/- infection--> she was initially on vanco and ceftriaxone and all cultures were negative.decision to treat as negative septic arthritis. She finished abtx on 3/31, we are seeing her off of abtx. Left her last suture in for 3 wk, once removed. No drainage ? ?140 degree of flexion is perfect. Has 1 more week of PT and then gets green light. ? ?Hx of right foot fusion - so she doesn't run but long walks with her [place] shepard - walks 1-2 mile per day.  ? ?Lab Results  ?Component Value Date  ? ESRSEDRATE 17 11/14/2021  ? ?Lab Results  ?Component Value Date  ? CRP 3.6 11/14/2021  ? ? ? ? ?Outpatient Encounter Medications as of 12/05/2021  ?Medication Sig  ? dicyclomine (BENTYL) 20 MG tablet Take 40 mg by mouth 4 (four) times daily as needed for spasms.  ? diphenoxylate-atropine (LOMOTIL) 2.5-0.025 MG tablet Take 1 tablet by mouth 4 (four) times daily as needed for diarrhea or loose stools.  ? dronabinol (MARINOL) 5 MG capsule Take 5 mg by mouth 2 (two) times daily as needed (for nausea or appetite).  ? drospirenone-ethinyl estradiol (YAZ) 3-0.02 MG tablet Take 1 tablet by mouth daily with lunch.  ? LATUDA 40 MG TABS tablet Take 40 mg by mouth See admin instructions. Take 40 mg by mouth at 6 PM  ? LORazepam (ATIVAN) 1 MG tablet Take 1 mg by mouth every 6 (six) hours.  ? methocarbamol (ROBAXIN) 500 MG tablet Take 1 tablet (500 mg total) by mouth every 6 (six) hours as needed for muscle spasms.  ? ondansetron (ZOFRAN) 4 MG tablet Take 1 tablet (4 mg total) by mouth every 6 (six) hours as needed for nausea.  ? ondansetron (ZOFRAN-ODT) 8 MG disintegrating tablet Take 8 mg by mouth every 8 (eight) hours as needed for nausea (DISSOLVE ORALLY).  ? oxyCODONE 10 MG  TABS Take 1-1.5 tablets (10-15 mg total) by mouth every 4 (four) hours as needed for severe pain (pain score 7-10).  ? pantoprazole (PROTONIX) 40 MG tablet Take 80 mg by mouth 2 (two) times daily.  ? propranolol (INDERAL) 10 MG tablet Take 10 mg by mouth See admin instructions. Take 10 mg by mouth at 6 AM, 12 Noon, and 6 PM  ? rizatriptan (MAXALT) 10 MG tablet Take 10 mg by mouth as needed for migraine (and may repeat once in 2 hours, if no relief).  ? rosuvastatin (CRESTOR) 10 MG tablet Take 10 mg by mouth at bedtime.  ? spironolactone (ALDACTONE) 50 MG tablet Take 100 mg by mouth daily at 12 noon.  ? zolpidem (AMBIEN CR) 12.5 MG CR tablet Take 12.5 mg by mouth at bedtime.  ? ?No facility-administered encounter medications on file as of 12/05/2021.  ?  ? ?Patient Active Problem List  ? Diagnosis Date Noted  ? Septic arthritis of knee, left (HCC) 10/28/2021  ? Pain in joint, ankle and foot 07/09/2017  ? MDD (major depressive disorder) 07/06/2017  ? Lymphocytic colitis 08/22/2016  ? Conversion disorder 06/20/2015  ? Chronic daily headache 05/09/2015  ? ? ? ?Health Maintenance Due  ?Topic Date Due  ? COVID-19 Vaccine (1) Never done  ? Hepatitis C Screening  Never done  ? TETANUS/TDAP  Never  done  ? PAP SMEAR-Modifier  Never done  ?  ? ?Review of Systems ?12 point ros is negative except for what is mentioned in hpi ?Physical Exam  ? ?There were no vitals taken for this visit.  ?Skin = Well healed incision  ? ?CBC ?Lab Results  ?Component Value Date  ? WBC 5.6 11/14/2021  ? RBC 4.13 11/14/2021  ? HGB 12.6 11/14/2021  ? HCT 37.6 11/14/2021  ? PLT 293 11/14/2021  ? MCV 91.0 11/14/2021  ? MCH 30.5 11/14/2021  ? MCHC 33.5 11/14/2021  ? RDW 13.1 11/14/2021  ? LYMPHSABS 2,666 11/14/2021  ? EOSABS 22 11/14/2021  ? ? ?BMET ?Lab Results  ?Component Value Date  ? NA 134 (L) 10/28/2021  ? K 3.7 10/28/2021  ? CL 102 10/28/2021  ? CO2 23 10/28/2021  ? GLUCOSE 105 (H) 10/28/2021  ? BUN 11 10/28/2021  ? CREATININE 0.81 10/28/2021  ?  CALCIUM 9.7 10/28/2021  ? GFRNONAA >60 10/28/2021  ? GFRAA >60 11/28/2019  ? ? ? ? ?Assessment and Plan ?Hx of wound dehiscence from partial medial meniscectomy treated for presumed septic bursitis/arthritis = ?No recurrence of infection to left knee, well healed. No need for further abtx ? ?Ibs = at baseline ? ? ?

## 2021-12-10 DIAGNOSIS — M25562 Pain in left knee: Secondary | ICD-10-CM | POA: Diagnosis not present

## 2021-12-13 LAB — ACID FAST CULTURE WITH REFLEXED SENSITIVITIES (MYCOBACTERIA): Acid Fast Culture: NEGATIVE

## 2021-12-16 DIAGNOSIS — Z6824 Body mass index (BMI) 24.0-24.9, adult: Secondary | ICD-10-CM | POA: Diagnosis not present

## 2021-12-16 DIAGNOSIS — B3731 Acute candidiasis of vulva and vagina: Secondary | ICD-10-CM | POA: Diagnosis not present

## 2021-12-16 DIAGNOSIS — I10 Essential (primary) hypertension: Secondary | ICD-10-CM | POA: Diagnosis not present

## 2021-12-16 DIAGNOSIS — F3130 Bipolar disorder, current episode depressed, mild or moderate severity, unspecified: Secondary | ICD-10-CM | POA: Diagnosis not present

## 2021-12-16 DIAGNOSIS — Z309 Encounter for contraceptive management, unspecified: Secondary | ICD-10-CM | POA: Diagnosis not present

## 2021-12-16 DIAGNOSIS — Z0000 Encounter for general adult medical examination without abnormal findings: Secondary | ICD-10-CM | POA: Diagnosis not present

## 2021-12-19 DIAGNOSIS — K580 Irritable bowel syndrome with diarrhea: Secondary | ICD-10-CM | POA: Diagnosis not present

## 2021-12-19 DIAGNOSIS — K2100 Gastro-esophageal reflux disease with esophagitis, without bleeding: Secondary | ICD-10-CM | POA: Diagnosis not present

## 2022-01-28 DIAGNOSIS — K2100 Gastro-esophageal reflux disease with esophagitis, without bleeding: Secondary | ICD-10-CM | POA: Diagnosis not present

## 2022-01-28 DIAGNOSIS — K580 Irritable bowel syndrome with diarrhea: Secondary | ICD-10-CM | POA: Diagnosis not present

## 2022-02-19 DIAGNOSIS — F3130 Bipolar disorder, current episode depressed, mild or moderate severity, unspecified: Secondary | ICD-10-CM | POA: Diagnosis not present

## 2022-02-19 DIAGNOSIS — Z6825 Body mass index (BMI) 25.0-25.9, adult: Secondary | ICD-10-CM | POA: Diagnosis not present

## 2022-02-27 DIAGNOSIS — Z6824 Body mass index (BMI) 24.0-24.9, adult: Secondary | ICD-10-CM | POA: Diagnosis not present

## 2022-02-27 DIAGNOSIS — F3130 Bipolar disorder, current episode depressed, mild or moderate severity, unspecified: Secondary | ICD-10-CM | POA: Diagnosis not present

## 2022-02-27 DIAGNOSIS — R519 Headache, unspecified: Secondary | ICD-10-CM | POA: Diagnosis not present

## 2022-02-27 DIAGNOSIS — R110 Nausea: Secondary | ICD-10-CM | POA: Diagnosis not present

## 2022-03-02 DIAGNOSIS — M549 Dorsalgia, unspecified: Secondary | ICD-10-CM | POA: Diagnosis not present

## 2022-03-02 DIAGNOSIS — M461 Sacroiliitis, not elsewhere classified: Secondary | ICD-10-CM | POA: Diagnosis not present

## 2022-03-02 DIAGNOSIS — S39012A Strain of muscle, fascia and tendon of lower back, initial encounter: Secondary | ICD-10-CM | POA: Diagnosis not present

## 2022-03-10 ENCOUNTER — Ambulatory Visit (HOSPITAL_COMMUNITY)
Admission: EM | Admit: 2022-03-10 | Discharge: 2022-03-11 | Disposition: A | Discharge status: Other Acute Inpatient Hospital | Payer: PPO | Attending: Registered Nurse | Admitting: Registered Nurse

## 2022-03-10 ENCOUNTER — Encounter (HOSPITAL_COMMUNITY): Payer: Self-pay | Admitting: Registered Nurse

## 2022-03-10 DIAGNOSIS — F319 Bipolar disorder, unspecified: Secondary | ICD-10-CM | POA: Diagnosis not present

## 2022-03-10 DIAGNOSIS — F419 Anxiety disorder, unspecified: Secondary | ICD-10-CM | POA: Diagnosis not present

## 2022-03-10 DIAGNOSIS — F314 Bipolar disorder, current episode depressed, severe, without psychotic features: Secondary | ICD-10-CM | POA: Diagnosis not present

## 2022-03-10 DIAGNOSIS — Z20822 Contact with and (suspected) exposure to covid-19: Secondary | ICD-10-CM | POA: Insufficient documentation

## 2022-03-10 DIAGNOSIS — F332 Major depressive disorder, recurrent severe without psychotic features: Secondary | ICD-10-CM | POA: Diagnosis present

## 2022-03-10 DIAGNOSIS — F4310 Post-traumatic stress disorder, unspecified: Secondary | ICD-10-CM | POA: Diagnosis present

## 2022-03-10 DIAGNOSIS — Z9049 Acquired absence of other specified parts of digestive tract: Secondary | ICD-10-CM | POA: Diagnosis not present

## 2022-03-10 DIAGNOSIS — F411 Generalized anxiety disorder: Secondary | ICD-10-CM | POA: Diagnosis present

## 2022-03-10 DIAGNOSIS — R45851 Suicidal ideations: Secondary | ICD-10-CM | POA: Diagnosis not present

## 2022-03-10 DIAGNOSIS — Z79899 Other long term (current) drug therapy: Secondary | ICD-10-CM | POA: Diagnosis not present

## 2022-03-10 LAB — COMPREHENSIVE METABOLIC PANEL
ALT: 11 U/L (ref 0–44)
AST: 9 U/L — ABNORMAL LOW (ref 15–41)
Albumin: 3.8 g/dL (ref 3.5–5.0)
Alkaline Phosphatase: 47 U/L (ref 38–126)
Anion gap: 8 (ref 5–15)
BUN: 17 mg/dL (ref 6–20)
CO2: 23 mmol/L (ref 22–32)
Calcium: 9.9 mg/dL (ref 8.9–10.3)
Chloride: 103 mmol/L (ref 98–111)
Creatinine, Ser: 0.79 mg/dL (ref 0.44–1.00)
GFR, Estimated: >60 mL/min (ref >60)
Glucose, Bld: 98 mg/dL (ref 70–99)
Potassium: 4.2 mmol/L (ref 3.5–5.1)
Sodium: 134 mmol/L — ABNORMAL LOW (ref 135–145)
Total Bilirubin: 0.4 mg/dL (ref 0.3–1.2)
Total Protein: 6.6 g/dL (ref 6.5–8.1)

## 2022-03-10 LAB — RESP PANEL BY RT-PCR (FLU A&B, COVID) ARPGX2
Influenza A by PCR: NEGATIVE
Influenza B by PCR: NEGATIVE
SARS Coronavirus 2 by RT PCR: NEGATIVE

## 2022-03-10 LAB — URINALYSIS, ROUTINE W REFLEX MICROSCOPIC
Bilirubin Urine: NEGATIVE
Glucose, UA: NEGATIVE mg/dL
Ketones, ur: NEGATIVE mg/dL
Leukocytes,Ua: NEGATIVE
Nitrite: NEGATIVE
Protein, ur: NEGATIVE mg/dL
Specific Gravity, Urine: 1.017 (ref 1.005–1.030)
pH: 5 (ref 5.0–8.0)

## 2022-03-10 LAB — POCT URINE DRUG SCREEN - MANUAL ENTRY (I-SCREEN)
POC Amphetamine UR: NOT DETECTED
POC Buprenorphine (BUP): NOT DETECTED
POC Cocaine UR: NOT DETECTED
POC Marijuana UR: POSITIVE — AB
POC Methadone UR: NOT DETECTED
POC Methamphetamine UR: NOT DETECTED
POC Morphine: NOT DETECTED
POC Oxazepam (BZO): POSITIVE — AB
POC Oxycodone UR: NOT DETECTED
POC Secobarbital (BAR): NOT DETECTED

## 2022-03-10 LAB — MAGNESIUM: Magnesium: 2.2 mg/dL (ref 1.7–2.4)

## 2022-03-10 LAB — LIPID PANEL
Cholesterol: 233 mg/dL — ABNORMAL HIGH (ref 0–200)
HDL: 87 mg/dL (ref >40)
LDL Cholesterol: 109 mg/dL — ABNORMAL HIGH (ref 0–99)
Total CHOL/HDL Ratio: 2.7 RATIO
Triglycerides: 183 mg/dL — ABNORMAL HIGH (ref <150)
VLDL: 37 mg/dL (ref 0–40)

## 2022-03-10 LAB — CBC WITH DIFFERENTIAL/PLATELET
Abs Immature Granulocytes: 0.03 10*3/uL (ref 0.00–0.07)
Basophils Absolute: 0 10*3/uL (ref 0.0–0.1)
Basophils Relative: 0 %
Eosinophils Absolute: 0 10*3/uL (ref 0.0–0.5)
Eosinophils Relative: 0 %
HCT: 35.4 % — ABNORMAL LOW (ref 36.0–46.0)
Hemoglobin: 12.4 g/dL (ref 12.0–15.0)
Immature Granulocytes: 0 %
Lymphocytes Relative: 48 %
Lymphs Abs: 3.5 10*3/uL (ref 0.7–4.0)
MCH: 29.7 pg (ref 26.0–34.0)
MCHC: 35 g/dL (ref 30.0–36.0)
MCV: 84.7 fL (ref 80.0–100.0)
Monocytes Absolute: 0.3 10*3/uL (ref 0.1–1.0)
Monocytes Relative: 5 %
Neutro Abs: 3.5 10*3/uL (ref 1.7–7.7)
Neutrophils Relative %: 47 %
Platelets: 280 10*3/uL (ref 150–400)
RBC: 4.18 MIL/uL (ref 3.87–5.11)
RDW: 13.7 % (ref 11.5–15.5)
WBC: 7.4 10*3/uL (ref 4.0–10.5)
nRBC: 0 % (ref 0.0–0.2)

## 2022-03-10 LAB — POC SARS CORONAVIRUS 2 AG: SARSCOV2ONAVIRUS 2 AG: NEGATIVE

## 2022-03-10 LAB — PREGNANCY, URINE: Preg Test, Ur: NEGATIVE

## 2022-03-10 LAB — ETHANOL: Alcohol, Ethyl (B): <10 mg/dL (ref <10)

## 2022-03-10 LAB — TSH: TSH: 1.639 u[IU]/mL (ref 0.350–4.500)

## 2022-03-10 MED ORDER — ROSUVASTATIN CALCIUM 5 MG PO TABS
10.0000 mg | ORAL_TABLET | Freq: Every day | ORAL | Status: DC
  Administered 2022-03-10: 10 mg via ORAL
  Filled 2022-03-10: qty 2

## 2022-03-10 MED ORDER — LURASIDONE HCL 40 MG PO TABS: 40.0000 mg | ORAL_TABLET | Freq: Every day | ORAL | Status: DC

## 2022-03-10 MED ORDER — MAGNESIUM HYDROXIDE 400 MG/5ML PO SUSP: 30.0000 mL | Freq: Every day | ORAL | Status: DC | PRN

## 2022-03-10 MED ORDER — ALUM & MAG HYDROXIDE-SIMETH 200-200-20 MG/5ML PO SUSP: 30.0000 mL | ORAL | Status: DC | PRN

## 2022-03-10 MED ORDER — PANTOPRAZOLE SODIUM 40 MG PO TBEC
80.0000 mg | DELAYED_RELEASE_TABLET | Freq: Two times a day (BID) | ORAL | Status: DC
  Administered 2022-03-10 – 2022-03-11 (×2): 80 mg via ORAL
  Filled 2022-03-10 (×2): qty 2

## 2022-03-10 MED ORDER — LORAZEPAM 1 MG PO TABS
1.0000 mg | ORAL_TABLET | Freq: Three times a day (TID) | ORAL | Status: DC
  Administered 2022-03-10 – 2022-03-11 (×4): 1 mg via ORAL
  Filled 2022-03-10 (×3): qty 1

## 2022-03-10 MED ORDER — SPIRONOLACTONE 25 MG PO TABS
100.0000 mg | ORAL_TABLET | Freq: Every day | ORAL | Status: DC
  Administered 2022-03-11: 100 mg via ORAL
  Filled 2022-03-10: qty 4

## 2022-03-10 MED ORDER — ZOLPIDEM TARTRATE 5 MG PO TABS
5.0000 mg | ORAL_TABLET | Freq: Every day | ORAL | Status: DC
Start: 2022-03-10 — End: 2022-03-11
  Administered 2022-03-10: 5 mg via ORAL
  Filled 2022-03-10: qty 1

## 2022-03-10 MED ORDER — DICYCLOMINE HCL 20 MG PO TABS: 40.0000 mg | ORAL_TABLET | Freq: Four times a day (QID) | ORAL | Status: DC | PRN

## 2022-03-10 MED ORDER — LORAZEPAM 1 MG PO TABS
1.0000 mg | ORAL_TABLET | Freq: Once | ORAL | Status: DC
  Filled 2022-03-10: qty 1

## 2022-03-10 MED ORDER — ONDANSETRON 4 MG PO TBDP
8.0000 mg | ORAL_TABLET | Freq: Three times a day (TID) | ORAL | Status: DC | PRN
  Administered 2022-03-11: 8 mg via ORAL
  Filled 2022-03-10: qty 2

## 2022-03-10 MED ORDER — SUMATRIPTAN SUCCINATE 50 MG PO TABS
50.0000 mg | ORAL_TABLET | Freq: Every day | ORAL | Status: DC | PRN
  Administered 2022-03-11: 50 mg via ORAL
  Filled 2022-03-10: qty 1

## 2022-03-10 MED ORDER — DRONABINOL 5 MG PO CAPS: 5.0000 mg | ORAL_CAPSULE | Freq: Two times a day (BID) | ORAL | Status: DC | PRN

## 2022-03-10 MED ORDER — DIPHENOXYLATE-ATROPINE 2.5-0.025 MG PO TABS: 1.0000 | ORAL_TABLET | Freq: Four times a day (QID) | ORAL | Status: DC | PRN

## 2022-03-10 MED ORDER — PROPRANOLOL HCL 10 MG PO TABS
10.0000 mg | ORAL_TABLET | Freq: Three times a day (TID) | ORAL | Status: DC
  Administered 2022-03-10 – 2022-03-11 (×3): 10 mg via ORAL
  Filled 2022-03-10 (×3): qty 1

## 2022-03-10 MED ORDER — DROSPIRENONE-ETHINYL ESTRADIOL 3-0.02 MG PO TABS
1.0000 | ORAL_TABLET | Freq: Every day | ORAL | Status: DC
  Administered 2022-03-11: 1 via ORAL

## 2022-03-10 MED ORDER — LURASIDONE HCL 40 MG PO TABS
40.0000 mg | ORAL_TABLET | Freq: Every day | ORAL | Status: DC
  Filled 2022-03-10: qty 1

## 2022-03-10 NOTE — ED Notes (Signed)
Pt A&O x 4, presents with suicidal ideations, plan to overdose on meds.  Denies HI or AVH.  Pt has been tearful anxious, monitoring for safety.
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
## 2022-03-10 NOTE — ED Notes (Signed)
Called [facility] courier called to collect STAT specimens and to deliver to [facility].   Specimen collected via R AC x1 stick, pt tolerated well.

## 2022-03-10 NOTE — Progress Notes (Signed)
Pt accepted to [facility] 03/11/22 per Dr. Toni Amend, and Leonarda Salon, RN. CSW will follow up with [facility] tomorrow to accepting information.    Care Team notified: Dr. Toni Amend, and Leonarda Salon, RN. Assunta Found, NP, Nelly Rout, MD, and Margarita Rana, RN.     Maryjean Ka, MSW, LCSWA 03/10/2022 11:18 PM

## 2022-03-10 NOTE — ED Notes (Signed)
Pt sleeping@this time. Breathing even and unlabored. Will continue to monitor for safety 

## 2022-03-10 NOTE — ED Provider Notes (Cosign Needed Addendum)
[facility] Continuous Assessment Admission H&P  Date: 03/10/22 Patient Name: Kayla Holden MRN: [id number] Chief Complaint:  Chief Complaint  Patient presents with   Psychiatric Evaluation      Diagnoses:  Final diagnoses:  MDD (major depressive disorder), recurrent severe, without psychosis (HCC)  PTSD (post-traumatic stress disorder)  Generalized anxiety disorder    HPI: Kayla Holden 41 y.o., female patient with psychiatric history of Major Depression (treatment resistant) General Anxiety Disorder, and PTSD presents to [facility] voluntary as a walk in accompanied by her father with complaints of worsening depression, suicidal ideation with intent, plan, and means.    Kayla Holden, 21 yr., female patient seen face to face by this provider, consulted with Dr. Nelly Rout; and chart reviewed on 03/10/22.  On evaluation Kayla Holden reports she has a chronic mental health history and has had several psychiatric hospitalizations with last being in 2018.  Reports she was sent to urgent care today by counselor related to her suicidal ideation and unable to contract for safety.  Patient reporting that she has a plan to "overdose again or hang myself from the attic.  Patient states she has outpatient psychiatric services with [facility] and has had several recent medication changes that are not going well "I was on Latuda and then they switched me to the generic which I had a reaction to, then I was put on Pristiq, and I had a reaction to that as well, then I was put back on Jordan."  Patient states that the Jordan "works better than anything else, not the generic.  I've been back on it now for about 2 weeks."  Patient states that at one point or time she has been on just about everything (Seroquel, Depakote, Lithium, just about everything).  Patient states she has also had the genetic testing done.  Did not have a copy with her but states that [facility] has a copy of  results.  Patient reports one prior suicide attempt via overdose 2018.  Denies self-harm.  Patient states that she also has a lot of medical issues that causes depression "Issues with my GI, headaches, nausea, and back pain the combination of all of them cause the depression."   Patient denies illicit drug use and states that she drinks occasionally.  Last drink of alcohol was 2 weeks ago.  Patient lives alone (Single, no children), and is employed part time.  States that her father and best friend is her primary support system.   During evaluation Kayla Holden is sitting upright in chair with no noted distress.  She is alert, oriented x 4, calm, cooperative and attentive.  Her mood is anxious and depressed with congruent affect.  She has normal speech, and behavior.  Objectively there is no evidence of psychosis/mania or delusional thinking.  Patient is able to converse coherently, goal directed thoughts, no distractibility, or pre-occupation.  She denies harm/homicidal ideation, psychosis, and paranoia.  She continues to endorse suicidal ideation with intent, plan, and means; unable to contract for safety.  Recommending inpatient psychiatric treatment.     PHQ 2-9:   Flowsheet Row Admission (Discharged) from 10/28/2021 in [facility] from 11/28/2019 in [facility]  C-SSRS RISK CATEGORY No Risk High Risk        Total Time spent with patient: 45 minutes  Musculoskeletal  Strength & Muscle Tone: within normal limits Gait & Station: normal Patient leans: N/A  Psychiatric  Specialty Exam  Presentation General Appearance: Appropriate for Environment; Casual  Eye Contact:Good  Speech:Clear and Coherent; Normal Rate  Speech Volume:Normal  Handedness:Right   Mood and Affect  Mood:Anxious; Depressed  Affect:Congruent   Thought Process  Thought Processes:Coherent; Goal Directed  Descriptions of  Associations:Intact  Orientation:Full (Time, Place and Person)  Thought Content:Logical    Hallucinations:Hallucinations: None  Ideas of Reference:None  Suicidal Thoughts:Suicidal Thoughts: Yes, Active SI Active Intent and/or Plan: With Intent; With Plan; With Means to Carry Out  Homicidal Thoughts:Homicidal Thoughts: No   Sensorium  Memory:Immediate Good; Recent Good; Remote Good  Judgment:Fair  Insight:Fair; Present   Executive Functions  Concentration:Good  Attention Span:Good  Recall:Good  Fund of Knowledge:Good  Language:Good   Psychomotor Activity  Psychomotor Activity:Psychomotor Activity: Normal   Assets  Assets:Communication Skills; Desire for Improvement; Financial Resources/Insurance; Housing; Resilience; Social Support   Sleep  Sleep:Sleep: Fair   Nutritional Assessment (For OBS and FBC admissions only) Has the patient had a weight loss or gain of 10 pounds or more in the last 3 months?: No Has the patient had a decrease in food intake/or appetite?: Yes Does the patient have dental problems?: No Does the patient have eating habits or behaviors that may be indicators of an eating disorder including binging or inducing vomiting?: No Has the patient recently lost weight without trying?: 0 Has the patient been eating poorly because of a decreased appetite?: 0 Malnutrition Screening Tool Score: 0    Physical Exam Vitals and nursing note reviewed. Exam conducted with a chaperone present.  Constitutional:      General: She is not in acute distress.    Appearance: Normal appearance. She is not ill-appearing.  Cardiovascular:     Rate and Rhythm: Normal rate.  Pulmonary:     Effort: Pulmonary effort is normal.  Skin:    General: Skin is warm and dry.  Neurological:     Mental Status: She is alert and oriented to person, place, and time.  Psychiatric:        Attention and Perception: Attention and perception normal. She does not perceive  auditory or visual hallucinations.        Mood and Affect: Mood is anxious and depressed.        Speech: Speech normal.        Behavior: Behavior normal. Behavior is cooperative.        Thought Content: Thought content is not paranoid or delusional. Thought content includes suicidal ideation. Thought content does not include homicidal ideation. Thought content includes suicidal plan.        Cognition and Memory: Cognition and memory normal.        Judgment: Judgment is impulsive.    Review of Systems  Constitutional: Negative.   HENT: Negative.    Eyes: Negative.   Respiratory: Negative.  Negative for shortness of breath.   Cardiovascular:  Positive for palpitations (History of palpatations). Negative for chest pain.  Gastrointestinal: Negative.  Nausea: History of nausea but denies at this time.  Genitourinary: Negative.   Musculoskeletal:  Positive for back pain.  Skin: Negative.   Neurological:  Positive for headaches (History of migraines.). Negative for seizures.  Endo/Heme/Allergies: Negative.   Psychiatric/Behavioral:  Positive for depression and suicidal ideas. Negative for hallucinations and substance abuse. The patient is nervous/anxious and has insomnia.     Blood pressure 135/88, pulse 83, temperature 98.5 F (36.9 C), temperature source Oral, resp. rate 18, SpO2 99 %. There is no height or weight on file  to calculate BMI.  Past Psychiatric History: Major Depression (treatment resistant) General Anxiety Disorder, and PTSD   Is the patient at risk to self? Yes  Has the patient been a risk to self in the past 6 months? Yes .    Has the patient been a risk to self within the distant past? Yes   Is the patient a risk to others? No   Has the patient been a risk to others in the past 6 months? No   Has the patient been a risk to others within the distant past? No   Past Medical History:  Past Medical History:  Diagnosis Date   Anxiety    Anxiety    Bipolar 1 disorder  (HCC)    Chiari malformation    Complication of anesthesia    Depression    GERD (gastroesophageal reflux disease)    MDD (major depressive disorder)    Microscopic colitis    Migraine headache    PONV (postoperative nausea and vomiting)     Past Surgical History:  Procedure Laterality Date   APPENDECTOMY     CHOLECYSTECTOMY     INCISION AND DRAINAGE OF WOUND Left 10/28/2021   Procedure: KNEE ARTHROSCOPY WITH IRRIGATION AND DEBRIDEMENT;  Surgeon: Eugenia Mcalpine, MD;  Location: [facility];  Service: Orthopedics;  Laterality: Left;  60 okay per OR   KNEE SURGERY Left    3 times   MANDIBLE SURGERY      Family History:  Family History  Problem Relation Age of Onset   Cancer Maternal Grandmother    Cancer Paternal Grandfather    Allergic rhinitis Neg Hx    Angioedema Neg Hx    Asthma Neg Hx    Atopy Neg Hx    Eczema Neg Hx    Urticaria Neg Hx    Immunodeficiency Neg Hx     Social History:  Social History   Socioeconomic History   Marital status: Single    Spouse name: Not on file   Number of children: Not on file   Years of education: Not on file   Highest education level: Not on file  Occupational History   Not on file  Tobacco Use   Smoking status: Former    Types: Cigarettes   Smokeless tobacco: Never  Vaping Use   Vaping Use: Never used  Substance and Sexual Activity   Alcohol use: No   Drug use: Yes    Types: Marijuana    Comment: has not smoked in last 3 days   Sexual activity: Not on file  Other Topics Concern   Not on file  Social History Narrative   Not on file   Social Determinants of Health   Financial Resource Strain: Not on file  Food Insecurity: Not on file  Transportation Needs: Not on file  Physical Activity: Not on file  Stress: Not on file  Social Connections: Not on file  Intimate Partner Violence: Not on file    SDOH:  SDOH Screenings   Alcohol Screen: Low Risk  (07/06/2017)   Alcohol Screen    Last Alcohol Screening Score  (AUDIT): 0  Depression (PHQ2-9): Low Risk  (12/05/2021)   Depression (PHQ2-9)    PHQ-2 Score: 2  Financial Resource Strain: Not on file  Food Insecurity: Not on file  Housing: Not on file  Physical Activity: Not on file  Social Connections: Not on file  Stress: Not on file  Tobacco Use: Medium Risk (03/10/2022)   Patient History  Smoking Tobacco Use: Former    Smokeless Tobacco Use: Never    Passive Exposure: Not on file  Transportation Needs: Not on file    Last Labs:  Admission on 03/10/2022  Component Date Value Ref Range Status   POC Amphetamine UR 03/10/2022 None Detected  NONE DETECTED (Cut Off Level 1000 ng/mL) Final   POC Secobarbital (BAR) 03/10/2022 None Detected  NONE DETECTED (Cut Off Level 300 ng/mL) Final   POC Buprenorphine (BUP) 03/10/2022 None Detected  NONE DETECTED (Cut Off Level 10 ng/mL) Final   POC Oxazepam (BZO) 03/10/2022 Positive (A)  NONE DETECTED (Cut Off Level 300 ng/mL) Final   POC Cocaine UR 03/10/2022 None Detected  NONE DETECTED (Cut Off Level 300 ng/mL) Final   POC Methamphetamine UR 03/10/2022 None Detected  NONE DETECTED (Cut Off Level 1000 ng/mL) Final   POC Morphine 03/10/2022 None Detected  NONE DETECTED (Cut Off Level 300 ng/mL) Final   POC Methadone UR 03/10/2022 None Detected  NONE DETECTED (Cut Off Level 300 ng/mL) Final   POC Oxycodone UR 03/10/2022 None Detected  NONE DETECTED (Cut Off Level 100 ng/mL) Final   POC Marijuana UR 03/10/2022 Positive (A)  NONE DETECTED (Cut Off Level 50 ng/mL) Final   SARSCOV2ONAVIRUS 2 AG 03/10/2022 NEGATIVE  NEGATIVE Final   Comment: (NOTE) SARS-CoV-2 antigen NOT DETECTED.   Negative results are presumptive.  Negative results do not preclude SARS-CoV-2 infection and should not be used as the sole basis for treatment or other patient management decisions, including infection  control decisions, particularly in the presence of clinical signs and  symptoms consistent with COVID-19, or in those who have  been in contact with the virus.  Negative results must be combined with clinical observations, patient history, and epidemiological information. The expected result is Negative.  Fact Sheet for Patients: https://www.jennings-kim.com/  Fact Sheet for Healthcare Providers: https://alexander-rogers.biz/  This test is not yet approved or cleared by the [place] FDA and  has been authorized for detection and/or diagnosis of SARS-CoV-2 by FDA under an Emergency Use Authorization (EUA).  This EUA will remain in effect (meaning this test can be used) for the duration of  the COV                          ID-19 declaration under Section 564(b)(1) of the Act, 21 U.S.C. section 360bbb-3(b)(1), unless the authorization is terminated or revoked sooner.    Office Visit on 11/14/2021  Component Date Value Ref Range Status   CRP 11/14/2021 3.6  <8.0 mg/L Final   Sed Rate 11/14/2021 17  0 - 20 mm/h Final   WBC 11/14/2021 5.6  3.8 - 10.8 Thousand/uL Final   RBC 11/14/2021 4.13  3.80 - 5.10 Million/uL Final   Hemoglobin 11/14/2021 12.6  11.7 - 15.5 g/dL Final   HCT 16/05/9603 37.6  35.0 - 45.0 % Final   MCV 11/14/2021 91.0  80.0 - 100.0 fL Final   MCH 11/14/2021 30.5  27.0 - 33.0 pg Final   MCHC 11/14/2021 33.5  32.0 - 36.0 g/dL Final   RDW 54/04/8118 13.1  11.0 - 15.0 % Final   Platelets 11/14/2021 293  140 - 400 Thousand/uL Final   MPV 11/14/2021 10.5  7.5 - 12.5 fL Final   Neutro Abs 11/14/2021 2,598  1,500 - 7,800 cells/uL Final   Lymphs Abs 11/14/2021 2,666  850 - 3,900 cells/uL Final   Absolute Monocytes 11/14/2021 302  200 - 950 cells/uL Final  Eosinophils Absolute 11/14/2021 22  15 - 500 cells/uL Final   Basophils Absolute 11/14/2021 11  0 - 200 cells/uL Final   Neutrophils Relative % 11/14/2021 46.4  % Final   Total Lymphocyte 11/14/2021 47.6  % Final   Monocytes Relative 11/14/2021 5.4  % Final   Eosinophils Relative 11/14/2021 0.4  % Final   Basophils  Relative 11/14/2021 0.2  % Final  Admission on 10/28/2021, Discharged on 10/30/2021  Component Date Value Ref Range Status   SARS Coronavirus 2 10/28/2021 NEGATIVE  NEGATIVE Final   Comment: (NOTE) SARS-CoV-2 target nucleic acids are NOT DETECTED.  The SARS-CoV-2 RNA is generally detectable in upper and lower respiratory specimens during the acute phase of infection. The lowest concentration of SARS-CoV-2 viral copies this assay can detect is 250 copies / mL. A negative result does not preclude SARS-CoV-2 infection and should not be used as the sole basis for treatment or other patient management decisions.  A negative result may occur with improper specimen collection / handling, submission of specimen other than nasopharyngeal swab, presence of viral mutation(s) within the areas targeted by this assay, and inadequate number of viral copies (<250 copies / mL). A negative result must be combined with clinical observations, patient history, and epidemiological information.  Fact Sheet for Patients:   BoilerBrush.com.cy  Fact Sheet for Healthcare Providers: https://pope.com/  This test is not yet approved or                           cleared by the [place] FDA and has been authorized for detection and/or diagnosis of SARS-CoV-2 by FDA under an Emergency Use Authorization (EUA).  This EUA will remain in effect (meaning this test can be used) for the duration of the COVID-19 declaration under Section 564(b)(1) of the Act, 21 U.S.C. section 360bbb-3(b)(1), unless the authorization is terminated or revoked sooner.  Performed at [facility], [address], [place], [place] [address]    WBC 10/28/2021 8.0  4.0 - 10.5 K/uL Final   RBC 10/28/2021 4.10  3.87 - 5.11 MIL/uL Final   Hemoglobin 10/28/2021 12.5  12.0 - 15.0 g/dL Final   HCT 35/36/1443 36.1  36.0 - 46.0 % Final   MCV 10/28/2021 88.0  80.0 - 100.0 fL  Final   MCH 10/28/2021 30.5  26.0 - 34.0 pg Final   MCHC 10/28/2021 34.6  30.0 - 36.0 g/dL Final   RDW 15/40/0867 14.2  11.5 - 15.5 % Final   Platelets 10/28/2021 243  150 - 400 K/uL Final   nRBC 10/28/2021 0.0  0.0 - 0.2 % Final   Performed at [facility], [address], [place], [place] [address]   Sodium 10/28/2021 138  135 - 145 mmol/L Final   Potassium 10/28/2021 4.0  3.5 - 5.1 mmol/L Final   Chloride 10/28/2021 105  98 - 111 mmol/L Final   CO2 10/28/2021 23  22 - 32 mmol/L Final   Glucose, Bld 10/28/2021 88  70 - 99 mg/dL Final   Glucose reference range applies only to samples taken after fasting for at least 8 hours.   BUN 10/28/2021 13  6 - 20 mg/dL Final   Creatinine, Ser 10/28/2021 0.72  0.44 - 1.00 mg/dL Final   Calcium 93/26/7124 9.9  8.9 - 10.3 mg/dL Final   Total Protein 58/04/9832 6.9  6.5 - 8.1 g/dL Final   Albumin 82/50/5397 3.7  3.5 - 5.0 g/dL Final  AST 10/28/2021 12 (L)  15 - 41 U/L Final   ALT 10/28/2021 12  0 - 44 U/L Final   Alkaline Phosphatase 10/28/2021 41  38 - 126 U/L Final   Total Bilirubin 10/28/2021 0.5  0.3 - 1.2 mg/dL Final   GFR, Estimated 10/28/2021 >60  >60 mL/min Final   Comment: (NOTE) Calculated using the CKD-EPI Creatinine Equation (2021)    Anion gap 10/28/2021 10  5 - 15 Final   Performed at [facility], [address], [place], [place] [address]   Preg Test, Ur 10/28/2021 NEGATIVE  NEGATIVE Final   Comment:        THE SENSITIVITY OF THIS METHODOLOGY IS >20 mIU/mL. Performed at [facility], [address] [place], [place] [address]    CRP 10/28/2021 0.5  <1.0 mg/dL Final   Performed at [facility], [address], [place], [place] [address]   Sed Rate 10/28/2021 13  0 - 22 mm/hr Final   Performed at [facility], [address] [place], [place] [address]   HIV Screen 4th Generation wRfx 10/28/2021 Non Reactive  Non Reactive Final   Performed at  [facility], [address], [place], [place] [address]   WBC 10/28/2021 7.1  4.0 - 10.5 K/uL Final   RBC 10/28/2021 4.57  3.87 - 5.11 MIL/uL Final   Hemoglobin 10/28/2021 14.1  12.0 - 15.0 g/dL Final   HCT 13/24/4010 40.9  36.0 - 46.0 % Final   MCV 10/28/2021 89.5  80.0 - 100.0 fL Final   MCH 10/28/2021 30.9  26.0 - 34.0 pg Final   MCHC 10/28/2021 34.5  30.0 - 36.0 g/dL Final   RDW 27/25/3664 14.1  11.5 - 15.5 % Final   Platelets 10/28/2021 283  150 - 400 K/uL Final   nRBC 10/28/2021 0.0  0.0 - 0.2 % Final   Performed at [facility], [address] [place], [place] [address]   Sodium 10/28/2021 134 (L)  135 - 145 mmol/L Final   Potassium 10/28/2021 3.7  3.5 - 5.1 mmol/L Final   Chloride 10/28/2021 102  98 - 111 mmol/L Final   CO2 10/28/2021 23  22 - 32 mmol/L Final   Glucose, Bld 10/28/2021 105 (H)  70 - 99 mg/dL Final   Glucose reference range applies only to samples taken after fasting for at least 8 hours.   BUN 10/28/2021 11  6 - 20 mg/dL Final   Creatinine, Ser 10/28/2021 0.81  0.44 - 1.00 mg/dL Final   Calcium 42/59/5638 9.7  8.9 - 10.3 mg/dL Final   Total Protein 75/64/3329 7.5  6.5 - 8.1 g/dL Final   Albumin 51/88/4166 4.1  3.5 - 5.0 g/dL Final   AST 02/15/1600 14 (L)  15 - 41 U/L Final   ALT 10/28/2021 14  0 - 44 U/L Final   Alkaline Phosphatase 10/28/2021 47  38 - 126 U/L Final   Total Bilirubin 10/28/2021 0.5  0.3 - 1.2 mg/dL Final   GFR, Estimated 10/28/2021 >60  >60 mL/min Final   Comment: (NOTE) Calculated using the CKD-EPI Creatinine Equation (2021)    Anion gap 10/28/2021 9  5 - 15 Final   Performed at [facility], [address] [place], [place] [address]   Specimen Description 10/28/2021    Final                   Value:SYNOVIAL Performed at [facility], [address] [place], [place] [address]    Special Requests 10/28/2021    Final                   Value:NONE LEFT KNEE Performed at  [facility], [address] [place], [place] [address]    Gram Stain 10/28/2021    Final                   Value:FEW WBC PRESENT, PREDOMINANTLY MONONUCLEAR NO ORGANISMS SEEN    Culture 10/28/2021    Final                   Value:No growth aerobically or anaerobically. Performed at [facility], [address] [place], [place] [address]    Report Status 10/28/2021 11/02/2021 FINAL   Final   Acid Fast Culture 10/28/2021 Negative   Final   Comment: (NOTE) No acid fast bacilli isolated after 6 weeks. Performed At: [facility] [address] [place], [place] [id number] Jolene Schimke MD [id number]    Source of Sample 10/28/2021 SYNOVIAL   Final   Comment: LEFT KNEE Performed at [facility], [address] [place] [address], [place], [place] [address]    AFB Specimen Processing 10/28/2021 Comment   Final   Tissue Grinding and Digestion/Decontamination   Acid Fast Smear 10/28/2021 Negative   Final   Comment: (NOTE) Performed At: [facility] [address] [place], [place] [id number] Jolene Schimke MD GU:[id number]    Source (AFB) 10/28/2021 SYNOVIAL   Final   Comment: LEFT KNEE Performed at [facility], [address] [place], [place] [address]     Allergies: Codeine, Fioricet-codeine [butalbital-apap-caff-cod], Isoniazid, Ketorolac tromethamine, Rifampin, Tape, Clindamycin, Ketoprofen, Nsaids, Butalbital-aspirin-caffeine, and Clindamycin/lincomycin  PTA Medications: (Not in a hospital admission)   Medical Decision Making  Kayla Holden was admitted to [facility] continuous assessment unit  for MDD (major depressive disorder), recurrent severe, without psychosis (HCC), crisis management, and stabilization. Routine labs ordered, which include Lab Orders         Resp Panel by RT-PCR (Flu A&B, Covid) Anterior Nasal Swab         CBC with Differential/Platelet          Comprehensive metabolic panel         Hemoglobin A1c         Magnesium         Ethanol         Lipid panel         TSH         Prolactin         Urinalysis, Routine w reflex microscopic Urine, Clean Catch         Pregnancy, urine         POCT Urine Drug Screen - (I-Screen)         POC SARS Coronavirus 2 Ag    Medication Management: Restarted home medications Meds ordered this encounter  Medications   alum & mag hydroxide-simeth (MAALOX/MYLANTA) 200-200-20 MG/5ML suspension 30 mL   magnesium hydroxide (MILK OF MAGNESIA) suspension 30 mL   lurasidone (LATUDA) tablet 40 mg   dronabinol (MARINOL) capsule 5 mg   spironolactone (ALDACTONE) tablet 100 mg   diphenoxylate-atropine (LOMOTIL) 2.5-0.025 MG per tablet 1 tablet   LORazepam (ATIVAN) tablet 1 mg   drospirenone-ethinyl estradiol (YAZ) 3-0.02 MG per tablet 1 tablet   zolpidem (AMBIEN) tablet 5 mg   dicyclomine (  BENTYL) tablet 40 mg   rosuvastatin (CRESTOR) tablet 10 mg   pantoprazole (PROTONIX) EC tablet 80 mg   SUMAtriptan (IMITREX) tablet 50 mg   propranolol (INDERAL) tablet 10 mg   ondansetron (ZOFRAN-ODT) disintegrating tablet 8 mg    Will maintain continuous observation for safety Recommended for inpatient psychiatric treatment:  Secure message sent to Dr. Toni Amendlapacs and social work informing:  Patient will need inpatient psychiatric history treatment resistant depression.  Currently suicidal with intent, plan, and means.  She has had ECT twice last was in 2018 and states that it worked really well for her.  Dr. Toni Amendlapacs can you review patient to see if able [facility].  [place] [facility] will need to fax out.  I think [facility] or [facility].  I'm in the process of putting my note in now.         Recommendations  Based on my evaluation the patient does not appear to have an emergency medical condition.  Inpatient psychiatric treatment  Assunta FoundShuvon Zaya Kessenich, NP 03/10/22  5:56 PM

## 2022-03-10 NOTE — Progress Notes (Addendum)
Per Dr. Toni Amend pt okay to admit to [facility] pending bed availability. CSW awaiting to follow up with nursing.   Maryjean Ka, MSW, LCSWA 03/10/2022 7:10 PM

## 2022-03-10 NOTE — ED Triage Notes (Signed)
Pt presents to [facility] accompanied by her family due to ongoing depression symptoms. Pt states [facility] and due to her statements about suicide she was recommended to [facility].Pt states she is experiencing SI with a plan to either overdose or hang herself from the attic. Pt reports overdose in November of 2018. Pt states her medication was recently changed 2 weeks ago. Pt states she needs medication management. Pt reports that she lives alone but her father lives close to her. Pt denies HI and AVH.

## 2022-03-11 ENCOUNTER — Other Ambulatory Visit: Payer: Self-pay | Admitting: Psychiatry

## 2022-03-11 ENCOUNTER — Other Ambulatory Visit: Payer: Self-pay

## 2022-03-11 ENCOUNTER — Encounter: Payer: Self-pay | Admitting: Psychiatry

## 2022-03-11 ENCOUNTER — Inpatient Hospital Stay
Admission: AD | Admit: 2022-03-11 | Discharge: 2022-03-17 | DRG: 885 | Disposition: A | Discharge status: Home / Self Care | Payer: PPO | Source: Intra-hospital | Attending: Psychiatry | Admitting: Psychiatry

## 2022-03-11 DIAGNOSIS — F319 Bipolar disorder, unspecified: Secondary | ICD-10-CM | POA: Diagnosis not present

## 2022-03-11 DIAGNOSIS — R45851 Suicidal ideations: Secondary | ICD-10-CM | POA: Diagnosis not present

## 2022-03-11 DIAGNOSIS — Z87891 Personal history of nicotine dependence: Secondary | ICD-10-CM

## 2022-03-11 DIAGNOSIS — F419 Anxiety disorder, unspecified: Secondary | ICD-10-CM | POA: Diagnosis present

## 2022-03-11 DIAGNOSIS — Z888 Allergy status to other drugs, medicaments and biological substances status: Secondary | ICD-10-CM

## 2022-03-11 DIAGNOSIS — Z9049 Acquired absence of other specified parts of digestive tract: Secondary | ICD-10-CM | POA: Diagnosis not present

## 2022-03-11 DIAGNOSIS — Z886 Allergy status to analgesic agent status: Secondary | ICD-10-CM

## 2022-03-11 DIAGNOSIS — Z20822 Contact with and (suspected) exposure to covid-19: Secondary | ICD-10-CM | POA: Diagnosis not present

## 2022-03-11 DIAGNOSIS — K219 Gastro-esophageal reflux disease without esophagitis: Secondary | ICD-10-CM

## 2022-03-11 DIAGNOSIS — Z885 Allergy status to narcotic agent status: Secondary | ICD-10-CM

## 2022-03-11 DIAGNOSIS — R200 Anesthesia of skin: Secondary | ICD-10-CM

## 2022-03-11 DIAGNOSIS — F332 Major depressive disorder, recurrent severe without psychotic features: Principal | ICD-10-CM | POA: Diagnosis present

## 2022-03-11 DIAGNOSIS — R519 Headache, unspecified: Secondary | ICD-10-CM | POA: Diagnosis not present

## 2022-03-11 DIAGNOSIS — L732 Hidradenitis suppurativa: Secondary | ICD-10-CM

## 2022-03-11 DIAGNOSIS — Z818 Family history of other mental and behavioral disorders: Secondary | ICD-10-CM

## 2022-03-11 LAB — HEMOGLOBIN A1C
Hgb A1c MFr Bld: 5.9 % — ABNORMAL HIGH (ref 4.8–5.6)
Mean Plasma Glucose: 123 mg/dL

## 2022-03-11 LAB — PROLACTIN: Prolactin: 19.4 ng/mL (ref 4.8–23.3)

## 2022-03-11 MED ORDER — MIDAZOLAM HCL 2 MG/2ML IJ SOLN: 2.0000 mg | Freq: Once | INTRAMUSCULAR | Status: DC

## 2022-03-11 MED ORDER — DRONABINOL 5 MG PO CAPS: 5.0000 mg | ORAL_CAPSULE | Freq: Two times a day (BID) | ORAL | Status: DC | PRN

## 2022-03-11 MED ORDER — ACETAMINOPHEN 325 MG PO TABS
650.0000 mg | ORAL_TABLET | Freq: Four times a day (QID) | ORAL | Status: DC | PRN
  Administered 2022-03-12 – 2022-03-13 (×3): 650 mg via ORAL
  Filled 2022-03-11 (×3): qty 2

## 2022-03-11 MED ORDER — SPIRONOLACTONE 100 MG PO TABS
100.0000 mg | ORAL_TABLET | Freq: Every day | ORAL | Status: DC
  Administered 2022-03-12 – 2022-03-17 (×6): 100 mg via ORAL
  Filled 2022-03-11 (×6): qty 1

## 2022-03-11 MED ORDER — ZOLPIDEM TARTRATE 5 MG PO TABS
5.0000 mg | ORAL_TABLET | Freq: Every day | ORAL | Status: DC
  Administered 2022-03-11: 5 mg via ORAL
  Filled 2022-03-11: qty 1

## 2022-03-11 MED ORDER — VITAMIN D3 25 MCG (1000 UNIT) PO TABS
1000.0000 [IU] | ORAL_TABLET | Freq: Every day | ORAL | Status: DC
  Administered 2022-03-12 – 2022-03-17 (×6): 1000 [IU] via ORAL
  Filled 2022-03-11 (×7): qty 1

## 2022-03-11 MED ORDER — VITAMIN B-12 1000 MCG PO TABS
1000.0000 ug | ORAL_TABLET | Freq: Every day | ORAL | Status: DC
  Administered 2022-03-12 – 2022-03-17 (×6): 1000 ug via ORAL
  Filled 2022-03-11 (×6): qty 1

## 2022-03-11 MED ORDER — MAGNESIUM HYDROXIDE 400 MG/5ML PO SUSP: 30.0000 mL | Freq: Every day | ORAL | Status: DC | PRN

## 2022-03-11 MED ORDER — NON FORMULARY: 40.0000 mg | Freq: Every day | Status: DC

## 2022-03-11 MED ORDER — DROSPIRENONE-ETHINYL ESTRADIOL 3-0.02 MG PO TABS
1.0000 | ORAL_TABLET | Freq: Every day | ORAL | Status: DC
  Administered 2022-03-12 – 2022-03-17 (×6): 1 via ORAL
  Filled 2022-03-11 (×10): qty 1

## 2022-03-11 MED ORDER — SODIUM CHLORIDE 0.9 % IV SOLN: 500.0000 mL | Freq: Once | INTRAVENOUS | Status: AC

## 2022-03-11 MED ORDER — LURASIDONE HCL 40 MG PO TABS
40.0000 mg | ORAL_TABLET | Freq: Every day | ORAL | Status: DC
Start: 2022-03-11 — End: 2022-03-13
  Administered 2022-03-11 – 2022-03-12 (×2): 40 mg via ORAL
  Filled 2022-03-11 (×3): qty 1

## 2022-03-11 MED ORDER — ALUM & MAG HYDROXIDE-SIMETH 200-200-20 MG/5ML PO SUSP: 30.0000 mL | ORAL | Status: DC | PRN

## 2022-03-11 MED ORDER — LURASIDONE HCL 40 MG PO TABS: 40.0000 mg | ORAL_TABLET | Freq: Every day | ORAL | Status: DC

## 2022-03-11 MED ORDER — LORAZEPAM 1 MG PO TABS
1.0000 mg | ORAL_TABLET | Freq: Three times a day (TID) | ORAL | Status: DC
Start: 2022-03-11 — End: 2022-03-17
  Administered 2022-03-11 – 2022-03-17 (×16): 1 mg via ORAL
  Filled 2022-03-11 (×17): qty 1

## 2022-03-11 MED ORDER — DICYCLOMINE HCL 20 MG PO TABS
40.0000 mg | ORAL_TABLET | Freq: Four times a day (QID) | ORAL | Status: DC | PRN
  Administered 2022-03-13: 40 mg via ORAL
  Filled 2022-03-11 (×3): qty 2

## 2022-03-11 MED ORDER — DIPHENOXYLATE-ATROPINE 2.5-0.025 MG PO TABS: 1.0000 | ORAL_TABLET | Freq: Four times a day (QID) | ORAL | Status: DC | PRN

## 2022-03-11 MED ORDER — ROSUVASTATIN CALCIUM 10 MG PO TABS
10.0000 mg | ORAL_TABLET | Freq: Every day | ORAL | Status: DC
  Administered 2022-03-11 – 2022-03-16 (×6): 10 mg via ORAL
  Filled 2022-03-11 (×7): qty 1

## 2022-03-11 MED ORDER — DRONABINOL 2.5 MG PO CAPS
5.0000 mg | ORAL_CAPSULE | Freq: Two times a day (BID) | ORAL | Status: DC
  Administered 2022-03-12 – 2022-03-17 (×11): 5 mg via ORAL
  Filled 2022-03-11 (×9): qty 2
  Filled 2022-03-11: qty 1
  Filled 2022-03-11 (×2): qty 2

## 2022-03-11 MED ORDER — ONDANSETRON 4 MG PO TBDP
8.0000 mg | ORAL_TABLET | Freq: Three times a day (TID) | ORAL | Status: DC | PRN
  Administered 2022-03-12 – 2022-03-17 (×4): 8 mg via ORAL
  Filled 2022-03-11 (×5): qty 2

## 2022-03-11 MED ORDER — SUMATRIPTAN SUCCINATE 50 MG PO TABS
50.0000 mg | ORAL_TABLET | Freq: Every day | ORAL | Status: DC | PRN
  Administered 2022-03-12 – 2022-03-17 (×4): 50 mg via ORAL
  Filled 2022-03-11 (×4): qty 1

## 2022-03-11 MED ORDER — PROPRANOLOL HCL 20 MG PO TABS
10.0000 mg | ORAL_TABLET | Freq: Three times a day (TID) | ORAL | Status: DC
  Administered 2022-03-11 – 2022-03-17 (×18): 10 mg via ORAL
  Filled 2022-03-11 (×18): qty 1

## 2022-03-11 MED ORDER — ZOLPIDEM TARTRATE 5 MG PO TABS: 5.0000 mg | ORAL_TABLET | Freq: Every day | ORAL | Status: DC

## 2022-03-11 MED ORDER — LORAZEPAM 1 MG PO TABS
1.0000 mg | ORAL_TABLET | Freq: Three times a day (TID) | ORAL | Status: DC
Start: 2022-03-11 — End: 2022-03-11

## 2022-03-11 MED ORDER — PANTOPRAZOLE SODIUM 40 MG PO TBEC
80.0000 mg | DELAYED_RELEASE_TABLET | Freq: Two times a day (BID) | ORAL | Status: DC
  Administered 2022-03-11 – 2022-03-17 (×13): 80 mg via ORAL
  Filled 2022-03-11 (×13): qty 2

## 2022-03-11 NOTE — ED Notes (Signed)
Pt sleeping at present, no distress noted.  Monitoring for safety. 

## 2022-03-11 NOTE — ED Provider Notes (Signed)
FBC/OBS ASAP Discharge Summary  Date and Time: 03/11/2022 11:06 AM  Name: Kayla Holden  MRN:  [id number]   Discharge Diagnoses:  Final diagnoses:  MDD (major depressive disorder), recurrent severe, without psychosis (HCC)  PTSD (post-traumatic stress disorder)  Generalized anxiety disorder   Subjective:   Pt reassessed face to face by Clinical research associate.  Pt reports presented to this facility yesterday for suicidal ideation w/ plans to overdose or hang herself from attic. Reports continues to feel suicidal today w/ plans. She is unable to verbally contract to safety. Pt reports hx of SA in 2018, ingested 90 valium. Per pt, received ECT in 2019 at [facility]. Pt denies current HI/VI, AVH, paranoia.   Reports grandfather died by suicide (firearm). States she was close to her grandfather. When asked about whether pt has access to a firearm, reports she does not have one now, although would be able to get access, "if I need to".  Pt is adamant she cannot take generic lurasidone. States she "spirals", has increased SI on generic. Per pt, was on trade latuda for 2 years, which worked well, then switched to generic lurasidone for 2 months, which she had increased SI, then switched to pristiq for "not even a week", and experienced n/v/d. Discussed this Clinical research associate will speak w/ pharmacy.   Pt has been accepted to [facility] for inpatient admission. Pt made aware and agrees w/ plan for transfer to [facility].  Stay Summary:  Pt is a 41 y/o female w/ hx of major depression (treatment resistant), generalized anxiety disorder, ptsd, presenting to [facility] on 03/10/22 w/ complaints of worsening depression, suicidal ideation with intent, plan, and means. On reassessment today, pt reports continued suicidal ideation w/ plans, is unable to verbally contract to safety. Pt has been recommended for inpatient admission and continues to meet inpatient criteria. Pt has been accepted to [facility] for inpatient admission.  Total Time spent with  patient: 20 minutes  Past Psychiatric History: Hx of major depression (treatment resistant), generalized anxiety disorder, ptsd Past Medical History:  Past Medical History:  Diagnosis Date   Anxiety    Anxiety    Bipolar 1 disorder (HCC)    Chiari malformation    Complication of anesthesia    Depression    GERD (gastroesophageal reflux disease)    MDD (major depressive disorder)    Microscopic colitis    Migraine headache    PONV (postoperative nausea and vomiting)     Past Surgical History:  Procedure Laterality Date   APPENDECTOMY     CHOLECYSTECTOMY     INCISION AND DRAINAGE OF WOUND Left 10/28/2021   Procedure: KNEE ARTHROSCOPY WITH IRRIGATION AND DEBRIDEMENT;  Surgeon: Eugenia Mcalpine, MD;  Location: [facility];  Service: Orthopedics;  Laterality: Left;  60 okay per OR   KNEE SURGERY Left    3 times   MANDIBLE SURGERY     Family History:  Family History  Problem Relation Age of Onset   Cancer Maternal Grandmother    Cancer Paternal Grandfather    Allergic rhinitis Neg Hx    Angioedema Neg Hx    Asthma Neg Hx    Atopy Neg Hx    Eczema Neg Hx    Urticaria Neg Hx    Immunodeficiency Neg Hx    Family Psychiatric History: Reports grandfather died by suicide (firearm).  Social History:  Social History   Substance and Sexual Activity  Alcohol Use No     Social History   Substance and Sexual Activity  Drug  Use Yes   Types: Marijuana   Comment: has not smoked in last 3 days    Social History   Socioeconomic History   Marital status: Single    Spouse name: Not on file   Number of children: Not on file   Years of education: Not on file   Highest education level: Not on file  Occupational History   Not on file  Tobacco Use   Smoking status: Former    Types: Cigarettes   Smokeless tobacco: Never  Vaping Use   Vaping Use: Never used  Substance and Sexual Activity   Alcohol use: No   Drug use: Yes    Types: Marijuana    Comment: has not smoked in last 3  days   Sexual activity: Not on file  Other Topics Concern   Not on file  Social History Narrative   Not on file   Social Determinants of Health   Financial Resource Strain: Not on file  Food Insecurity: Not on file  Transportation Needs: Not on file  Physical Activity: Not on file  Stress: Not on file  Social Connections: Not on file   SDOH:  SDOH Screenings   Alcohol Screen: Low Risk  (07/06/2017)   Alcohol Screen    Last Alcohol Screening Score (AUDIT): 0  Depression (PHQ2-9): Low Risk  (12/05/2021)   Depression (PHQ2-9)    PHQ-2 Score: 2  Financial Resource Strain: Not on file  Food Insecurity: Not on file  Housing: Not on file  Physical Activity: Not on file  Social Connections: Not on file  Stress: Not on file  Tobacco Use: Medium Risk (03/10/2022)   Patient History    Smoking Tobacco Use: Former    Smokeless Tobacco Use: Never    Passive Exposure: Not on file  Transportation Needs: Not on file    Tobacco Cessation:  N/A, patient does not currently use tobacco products  Current Medications:  Current Facility-Administered Medications  Medication Dose Route Frequency Provider Last Rate Last Admin   alum & mag hydroxide-simeth (MAALOX/MYLANTA) 200-200-20 MG/5ML suspension 30 mL  30 mL Oral Q4H PRN Rankin, Shuvon B, NP       dicyclomine (BENTYL) tablet 40 mg  40 mg Oral QID PRN Rankin, Shuvon B, NP       diphenoxylate-atropine (LOMOTIL) 2.5-0.025 MG per tablet 1 tablet  1 tablet Oral QID PRN Rankin, Shuvon B, NP       dronabinol (MARINOL) capsule 5 mg  5 mg Oral BID PRN Rankin, Shuvon B, NP       drospirenone-ethinyl estradiol (YAZ) 3-0.02 MG per tablet 1 tablet  1 tablet Oral Q lunch Rankin, Shuvon B, NP       LORazepam (ATIVAN) tablet 1 mg  1 mg Oral TID Rankin, Shuvon B, NP   1 mg at 03/11/22 0929   LORazepam (ATIVAN) tablet 1 mg  1 mg Oral Once Rankin, Shuvon B, NP       lurasidone (LATUDA) tablet 40 mg  40 mg Oral q1800 Rankin, Shuvon B, NP       magnesium  hydroxide (MILK OF MAGNESIA) suspension 30 mL  30 mL Oral Daily PRN Rankin, Shuvon B, NP       ondansetron (ZOFRAN-ODT) disintegrating tablet 8 mg  8 mg Oral Q8H PRN Rankin, Shuvon B, NP   8 mg at 03/11/22 0502   pantoprazole (PROTONIX) EC tablet 80 mg  80 mg Oral BID Rankin, Shuvon B, NP   80 mg at 03/11/22 [phone]  propranolol (INDERAL) tablet 10 mg  10 mg Oral TID Rankin, Shuvon B, NP   10 mg at 03/11/22 0502   rosuvastatin (CRESTOR) tablet 10 mg  10 mg Oral QHS Rankin, Shuvon B, NP   10 mg at 03/10/22 2156   spironolactone (ALDACTONE) tablet 100 mg  100 mg Oral Q1200 Rankin, Shuvon B, NP       SUMAtriptan (IMITREX) tablet 50 mg  50 mg Oral Daily PRN Rankin, Shuvon B, NP   50 mg at 03/11/22 0503   zolpidem (AMBIEN) tablet 5 mg  5 mg Oral QHS Rankin, Shuvon B, NP   5 mg at 03/10/22 2157   Current Outpatient Medications  Medication Sig Dispense Refill   dicyclomine (BENTYL) 20 MG tablet Take 40 mg by mouth 4 (four) times daily as needed for spasms.     diphenoxylate-atropine (LOMOTIL) 2.5-0.025 MG tablet Take 1 tablet by mouth 4 (four) times daily as needed for diarrhea or loose stools.     dronabinol (MARINOL) 5 MG capsule Take 5 mg by mouth 2 (two) times daily.     drospirenone-ethinyl estradiol (YAZ) 3-0.02 MG tablet Take 1 tablet by mouth daily with lunch.     LATUDA 40 MG TABS tablet Take 40 mg by mouth See admin instructions. Take 40 mg by mouth at 6 PM     LORazepam (ATIVAN) 1 MG tablet Take 1 mg by mouth every 6 (six) hours.     ondansetron (ZOFRAN-ODT) 8 MG disintegrating tablet Take 8 mg by mouth every 8 (eight) hours as needed for nausea (DISSOLVE ORALLY).     pantoprazole (PROTONIX) 40 MG tablet Take 80 mg by mouth 2 (two) times daily.     propranolol (INDERAL) 10 MG tablet Take 10 mg by mouth 4 (four) times daily.     rizatriptan (MAXALT) 10 MG tablet Take 10 mg by mouth as needed for migraine (and may repeat once in 2 hours, if no relief).     rosuvastatin (CRESTOR) 10 MG tablet  Take 10 mg by mouth at bedtime.     spironolactone (ALDACTONE) 50 MG tablet Take 100 mg by mouth daily at 12 noon.     zolpidem (AMBIEN CR) 12.5 MG CR tablet Take 12.5 mg by mouth at bedtime.      PTA Medications: (Not in a hospital admission)      12/05/2021    1:52 PM 11/14/2021    2:06 PM  Depression screen PHQ 2/9  Decreased Interest 1 0  Down, Depressed, Hopeless 1 0  PHQ - 2 Score 2 0    Flowsheet Row ED from 03/10/2022 in [facility] (Discharged) from 10/28/2021 in [facility] from 11/28/2019 in [facility]  C-SSRS RISK CATEGORY High Risk No Risk High Risk       Musculoskeletal  Strength & Muscle Tone: within normal limits Gait & Station: normal Patient leans: N/A  Psychiatric Specialty Exam  Presentation  General Appearance: Appropriate for Environment; Casual; Fairly Groomed  Eye Contact:Good  Speech:Clear and Coherent; Normal Rate  Speech Volume:Normal  Handedness:Right   Mood and Affect  Mood:Depressed; Anxious  Affect:Congruent   Thought Process  Thought Processes:Coherent; Goal Directed; Linear  Descriptions of Associations:Intact  Orientation:Full (Time, Place and Person)  Thought Content:Logical  Diagnosis of Schizophrenia or Schizoaffective disorder in past: No    Hallucinations:Hallucinations: None  Ideas of Reference:None  Suicidal Thoughts:Suicidal Thoughts: Yes, Active SI Active Intent and/or Plan: With Plan  Homicidal  Thoughts:Homicidal Thoughts: No   Sensorium  Memory:Immediate Good; Recent Good; Remote Good  Judgment:Fair  Insight:Fair   Executive Functions  Concentration:Good  Attention Span:Good  Recall:Good  Fund of Knowledge:Good  Language:Good   Psychomotor Activity  Psychomotor Activity:Psychomotor Activity: Normal   Assets  Assets:Communication Skills; Desire for Improvement; Financial  Resources/Insurance; Housing; Resilience; Social Support   Sleep  Sleep:Sleep: Fair   Nutritional Assessment (For OBS and FBC admissions only) Has the patient had a weight loss or gain of 10 pounds or more in the last 3 months?: No Has the patient had a decrease in food intake/or appetite?: Yes Does the patient have dental problems?: No Does the patient have eating habits or behaviors that may be indicators of an eating disorder including binging or inducing vomiting?: No Has the patient recently lost weight without trying?: 0 Has the patient been eating poorly because of a decreased appetite?: 0 Malnutrition Screening Tool Score: 0    Physical Exam  Physical Exam Cardiovascular:     Rate and Rhythm: Normal rate.  Pulmonary:     Effort: Pulmonary effort is normal.  Neurological:     Mental Status: She is alert and oriented to person, place, and time.  Psychiatric:        Attention and Perception: Attention and perception normal.        Mood and Affect: Mood is anxious and depressed.        Speech: Speech normal.        Behavior: Behavior normal. Behavior is cooperative.        Thought Content: Thought content includes suicidal ideation. Thought content includes suicidal plan.        Cognition and Memory: Cognition and memory normal.    Review of Systems  Constitutional:  Negative for chills and fever.  Respiratory:  Negative for shortness of breath.   Cardiovascular:  Negative for chest pain and palpitations.  Gastrointestinal:  Negative for abdominal pain.   Blood pressure 112/86, pulse 78, temperature 98.2 F (36.8 C), temperature source Oral, resp. rate 18, SpO2 97 %. There is no height or weight on file to calculate BMI.  Demographic Factors:  Caucasian and Living alone  Loss Factors: NA  Historical Factors: Family history of suicide and Family history of mental illness or substance abuse  Risk Reduction Factors:   Positive social support  Continued  Clinical Symptoms:  Previous Psychiatric Diagnoses and Treatments  Cognitive Features That Contribute To Risk:  None    Suicide Risk:  Severe:  Frequent, intense, and enduring suicidal ideation, specific plan, no subjective intent, but some objective markers of intent (i.e., choice of lethal method), the method is accessible, some limited preparatory behavior, evidence of impaired self-control, severe dysphoria/symptomatology, multiple risk factors present, and few if any protective factors, particularly a lack of social support.  Plan Of Care/Follow-up recommendations:  Pt has been accepted to [facility] for inpatient admission  Disposition:  Pt has been accepted to [facility] for inpatient admission  Lauree Chandler, NP 03/11/2022, 11:06 AM

## 2022-03-11 NOTE — H&P (Signed)
Psychiatric Admission Assessment Adult  Patient Identification: Kayla Holden MRN:  [id number] Date of Evaluation:  03/11/2022 Chief Complaint:  Severe recurrent major depression without psychotic features (HCC) [F33.2] Principal Diagnosis: Severe recurrent major depression without psychotic features (HCC) Diagnosis:  Principal Problem:   Severe recurrent major depression without psychotic features (HCC) Active Problems:   Chronic daily headache   Hidradenitis suppurativa   Gastric reflux  History of Present Illness: Patient seen and chart reviewed.  41 year old woman presented to evaluation in [place] for worsening depression.  Patient says her depression has been worse for several months.  She feels sad down and depressed and hopeless pretty much all of the time.  Energy level low.  Intrusive recurrent thoughts of suicide.  Had actually even made preparations with thinking about hanging herself.  Denies any psychotic symptoms.  Patient states that she is still compliant with her prescribed medication.  She just recently started seeing a new psychiatric provider who referred her to the emergency room out of concern for her suicidal ideation.  Patient has been getting treatment for depression for several years.  Had a suicide attempt in 2018.  Since then no further hospitalizations but chronic depression with a very large number of medication trials with minimal response to medicine other than recent prescription Latuda.  When she tried switching to generic Latuda or depression got worse. Associated Signs/Symptoms: Depression Symptoms:  depressed mood, anhedonia, fatigue, feelings of worthlessness/guilt, difficulty concentrating, hopelessness, suicidal thoughts without plan, anxiety, Duration of Depression Symptoms: Greater than two weeks  (Hypo) Manic Symptoms:   None reported Anxiety Symptoms:  Excessive Worry, Psychotic Symptoms:   None reported PTSD Symptoms: Negative Total  Time spent with patient: 1 hour  Past Psychiatric History: Patient has a past history of severe depression.  She was treated at the [facility] in 2019 with electroconvulsive therapy with good response.  Did not get further maintenance after that however.  Multiple medication trials with minimal response and quite a bit of side effect issue.  No further suicide attempts or hospitalizations until recently but has had intrusive suicidal thoughts.  No description of any mania.  Denies alcohol or drug abuse  Is the patient at risk to self? Yes.    Has the patient been a risk to self in the past 6 months? Yes.    Has the patient been a risk to self within the distant past? Yes.    Is the patient a risk to others? No.  Has the patient been a risk to others in the past 6 months? No.  Has the patient been a risk to others within the distant past? No.   [place] Scale:  Flowsheet Row Admission (Current) from 03/11/2022 in [facility] ED from 03/10/2022 in [facility] Admission (Discharged) from 10/28/2021 in [facility]  C-SSRS RISK CATEGORY Moderate Risk High Risk No Risk        Prior Inpatient Therapy:   Prior Outpatient Therapy:    Alcohol Screening: 1. How often do you have a drink containing alcohol?: 2 to 4 times a month 2. How many drinks containing alcohol do you have on a typical day when you are drinking?: 1 or 2 3. How often do you have six or more drinks on one occasion?: Never AUDIT-C Score: 2 4. How often during the last year have you found that you were not able to stop drinking once you had started?: Never 5. How often during the last  year have you failed to do what was normally expected from you because of drinking?: Never 6. How often during the last year have you needed a first drink in the morning to get yourself going after a heavy drinking session?: Never 7. How often during the last year have you had a  feeling of guilt of remorse after drinking?: Never 8. How often during the last year have you been unable to remember what happened the night before because you had been drinking?: Never 9. Have you or someone else been injured as a result of your drinking?: No 10. Has a relative or friend or a doctor or another health worker been concerned about your drinking or suggested you cut down?: No Alcohol Use Disorder Identification Test Final Score (AUDIT): 2 Substance Abuse History in the last 12 months:  No. Consequences of Substance Abuse: Negative Previous Psychotropic Medications: Yes  Psychological Evaluations: Yes  Past Medical History:  Past Medical History:  Diagnosis Date   Anxiety    Anxiety    Bipolar 1 disorder (HCC)    Chiari malformation    Complication of anesthesia    Depression    GERD (gastroesophageal reflux disease)    MDD (major depressive disorder)    Microscopic colitis    Migraine headache    PONV (postoperative nausea and vomiting)     Past Surgical History:  Procedure Laterality Date   APPENDECTOMY     CHOLECYSTECTOMY     INCISION AND DRAINAGE OF WOUND Left 10/28/2021   Procedure: KNEE ARTHROSCOPY WITH IRRIGATION AND DEBRIDEMENT;  Surgeon: Eugenia Mcalpine, MD;  Location: [facility];  Service: Orthopedics;  Laterality: Left;  60 okay per OR   KNEE SURGERY Left    3 times   MANDIBLE SURGERY     Family History:  Family History  Problem Relation Age of Onset   Cancer Maternal Grandmother    Cancer Paternal Grandfather    Allergic rhinitis Neg Hx    Angioedema Neg Hx    Asthma Neg Hx    Atopy Neg Hx    Eczema Neg Hx    Urticaria Neg Hx    Immunodeficiency Neg Hx    Family Psychiatric  History: Grandfather killed himself. Tobacco Screening:   Social History:  Social History   Substance and Sexual Activity  Alcohol Use No     Social History   Substance and Sexual Activity  Drug Use Yes   Types: Marijuana   Comment: has not smoked in last 3 days     Additional Social History:                           Allergies:   Allergies  Allergen Reactions   Codeine Other (See Comments)    Patient has "CYP2D6," so NO CODEINE   Fioricet-Codeine [Butalbital-Apap-Caff-Cod] Other (See Comments)    Patient has "CYP2D6" and cannot tolerate codeine, palpitations, rapid heart rate   Isoniazid Shortness Of Breath   Ketorolac Tromethamine Other (See Comments)    Abdominal Pain   Rifampin Shortness Of Breath and Hives   Tape Other (See Comments)    Paper tape causes blisters    Clindamycin Diarrhea and Other (See Comments)    Abdominal pain, also   Ketoprofen Other (See Comments)    Abdominal pain and cannot take due to history of colitis   Nsaids Other (See Comments)    History of colitis   Lab Results:  Results for orders placed  or performed during the hospital encounter of 03/10/22 (from the past 48 hour(s))  CBC with Differential/Platelet     Status: Abnormal   Collection Time: 03/10/22  5:08 PM  Result Value Ref Range   WBC 7.4 4.0 - 10.5 K/uL   RBC 4.18 3.87 - 5.11 MIL/uL   Hemoglobin 12.4 12.0 - 15.0 g/dL   HCT 08.6 (L) 57.8 - 46.9 %   MCV 84.7 80.0 - 100.0 fL   MCH 29.7 26.0 - 34.0 pg   MCHC 35.0 30.0 - 36.0 g/dL   RDW 62.9 52.8 - 41.3 %   Platelets 280 150 - 400 K/uL   nRBC 0.0 0.0 - 0.2 %   Neutrophils Relative % 47 %   Neutro Abs 3.5 1.7 - 7.7 K/uL   Lymphocytes Relative 48 %   Lymphs Abs 3.5 0.7 - 4.0 K/uL   Monocytes Relative 5 %   Monocytes Absolute 0.3 0.1 - 1.0 K/uL   Eosinophils Relative 0 %   Eosinophils Absolute 0.0 0.0 - 0.5 K/uL   Basophils Relative 0 %   Basophils Absolute 0.0 0.0 - 0.1 K/uL   Immature Granulocytes 0 %   Abs Immature Granulocytes 0.03 0.00 - 0.07 K/uL    Comment: Performed at [facility], [address], [place], [place] [address]  Comprehensive metabolic panel     Status: Abnormal   Collection Time: 03/10/22  5:08 PM  Result Value Ref Range   Sodium 134 (L) 135 - 145  mmol/L   Potassium 4.2 3.5 - 5.1 mmol/L   Chloride 103 98 - 111 mmol/L   CO2 23 22 - 32 mmol/L   Glucose, Bld 98 70 - 99 mg/dL    Comment: Glucose reference range applies only to samples taken after fasting for at least 8 hours.   BUN 17 6 - 20 mg/dL   Creatinine, Ser 0.27 0.44 - 1.00 mg/dL   Calcium 9.9 8.9 - 25.3 mg/dL   Total Protein 6.6 6.5 - 8.1 g/dL   Albumin 3.8 3.5 - 5.0 g/dL   AST 9 (L) 15 - 41 U/L   ALT 11 0 - 44 U/L   Alkaline Phosphatase 47 38 - 126 U/L   Total Bilirubin 0.4 0.3 - 1.2 mg/dL   GFR, Estimated >66 >44 mL/min    Comment: (NOTE) Calculated using the CKD-EPI Creatinine Equation (2021)    Anion gap 8 5 - 15    Comment: Performed at [facility], [address] [place], [place] [address]  Hemoglobin A1c     Status: Abnormal   Collection Time: 03/10/22  5:08 PM  Result Value Ref Range   Hgb A1c MFr Bld 5.9 (H) 4.8 - 5.6 %    Comment: (NOTE)         Prediabetes: 5.7 - 6.4         Diabetes: >6.4         Glycemic control for adults with diabetes: <7.0    Mean Plasma Glucose 123 mg/dL    Comment: (NOTE) Performed At: [facility] [address] [place], [place] [id number] Jolene Schimke MD [id number]   Magnesium     Status: None   Collection Time: 03/10/22  5:08 PM  Result Value Ref Range   Magnesium 2.2 1.7 - 2.4 mg/dL    Comment: Performed at [facility], [address], [place], [place] [address]  Ethanol     Status: None   Collection Time: 03/10/22  5:08 PM  Result Value Ref Range   Alcohol, Ethyl (B) <10 <10 mg/dL    Comment: (NOTE) Lowest detectable limit for serum alcohol is 10 mg/dL.  For medical purposes only. Performed at [facility], [address], [place], [place] [address]   Lipid panel     Status: Abnormal   Collection Time: 03/10/22  5:08 PM  Result Value Ref Range   Cholesterol 233 (H) 0 - 200 mg/dL   Triglycerides 237 (H) <150 mg/dL   HDL 87 >62 mg/dL   Total CHOL/HDL Ratio 2.7 RATIO   VLDL 37  0 - 40 mg/dL   LDL Cholesterol 831 (H) 0 - 99 mg/dL    Comment:        Total Cholesterol/HDL:CHD Risk Coronary Heart Disease Risk Table                     Men   Women  1/2 Average Risk   3.4   3.3  Average Risk       5.0   4.4  2 X Average Risk   9.6   7.1  3 X Average Risk  23.4   11.0        Use the calculated Patient Ratio above and the CHD Risk Table to determine the patient's CHD Risk.        ATP III CLASSIFICATION (LDL):  <100     mg/dL   Optimal  517-616  mg/dL   Near or Above                    Optimal  130-159  mg/dL   Borderline  073-710  mg/dL   High  >626     mg/dL   Very High Performed at [facility], [address] [place], [place] [address]   TSH     Status: None   Collection Time: 03/10/22  5:08 PM  Result Value Ref Range   TSH 1.639 0.350 - 4.500 uIU/mL    Comment: Performed by a 3rd Generation assay with a functional sensitivity of <=0.01 uIU/mL. Performed at [facility], [address], [place], [place] [address]   Prolactin     Status: None   Collection Time: 03/10/22  5:08 PM  Result Value Ref Range   Prolactin 19.4 4.8 - 23.3 ng/mL    Comment: (NOTE) Performed At: [facility] [address] [place], [place] [id number] Jolene Schimke MD [id number]   Resp Panel by RT-PCR (Flu A&B, Covid) Anterior Nasal Swab     Status: None   Collection Time: 03/10/22  5:39 PM   Specimen: Anterior Nasal Swab  Result Value Ref Range   SARS Coronavirus 2 by RT PCR NEGATIVE NEGATIVE    Comment: (NOTE) SARS-CoV-2 target nucleic acids are NOT DETECTED.  The SARS-CoV-2 RNA is generally detectable in upper respiratory specimens during the acute phase of infection. The lowest concentration of SARS-CoV-2 viral copies this assay can detect is 138 copies/mL. A negative result does not preclude SARS-Cov-2 infection and should not be used as the sole basis for treatment or other patient management decisions. A negative result may occur with   improper specimen collection/handling, submission of specimen other than nasopharyngeal swab, presence of viral mutation(s) within the areas targeted by this assay, and inadequate number of viral copies(<138 copies/mL). A negative result must be combined with clinical observations, patient history, and epidemiological information. The expected result is Negative.  Fact Sheet for Patients:  BloggerCourse.com  Fact Sheet for Healthcare Providers:  SeriousBroker.it  This test is no t yet approved or cleared by the [place] FDA and  has been authorized for detection and/or diagnosis of SARS-CoV-2 by FDA under an Emergency Use Authorization (EUA). This EUA will remain  in effect (meaning this test can be used) for the duration of the COVID-19 declaration under Section 564(b)(1) of the Act, 21 U.S.C.section 360bbb-3(b)(1), unless the authorization is terminated  or revoked sooner.       Influenza A by PCR NEGATIVE NEGATIVE   Influenza B by PCR NEGATIVE NEGATIVE    Comment: (NOTE) The Xpert Xpress SARS-CoV-2/FLU/RSV plus assay is intended as an aid in the diagnosis of influenza from Nasopharyngeal swab specimens and should not be used as a sole basis for treatment. Nasal washings and aspirates are unacceptable for Xpert Xpress SARS-CoV-2/FLU/RSV testing.  Fact Sheet for Patients: BloggerCourse.com  Fact Sheet for Healthcare Providers: SeriousBroker.it  This test is not yet approved or cleared by the [place] FDA and has been authorized for detection and/or diagnosis of SARS-CoV-2 by FDA under an Emergency Use Authorization (EUA). This EUA will remain in effect (meaning this test can be used) for the duration of the COVID-19 declaration under Section 564(b)(1) of the Act, 21 U.S.C. section 360bbb-3(b)(1), unless the authorization is terminated or revoked.  Performed at  [facility], [address], [place], [place] [address]   Urinalysis, Routine w reflex microscopic Anterior Nasal Swab     Status: Abnormal   Collection Time: 03/10/22  5:39 PM  Result Value Ref Range   Color, Urine YELLOW YELLOW   APPearance CLEAR CLEAR   Specific Gravity, Urine 1.017 1.005 - 1.030   pH 5.0 5.0 - 8.0   Glucose, UA NEGATIVE NEGATIVE mg/dL   Hgb urine dipstick MODERATE (A) NEGATIVE   Bilirubin Urine NEGATIVE NEGATIVE   Ketones, ur NEGATIVE NEGATIVE mg/dL   Protein, ur NEGATIVE NEGATIVE mg/dL   Nitrite NEGATIVE NEGATIVE   Leukocytes,Ua NEGATIVE NEGATIVE   RBC / HPF 0-5 0 - 5 RBC/hpf   WBC, UA 0-5 0 - 5 WBC/hpf   Bacteria, UA RARE (A) NONE SEEN   Squamous Epithelial / LPF 0-5 0 - 5    Comment: Performed at [facility], [address], [place], [place] [address]  Pregnancy, urine     Status: None   Collection Time: 03/10/22  5:39 PM  Result Value Ref Range   Preg Test, Ur NEGATIVE NEGATIVE    Comment:        THE SENSITIVITY OF THIS METHODOLOGY IS >20 mIU/mL. Performed at [facility], [address], [place], [place] [address]   POCT Urine Drug Screen - (I-Screen)     Status: Abnormal   Collection Time: 03/10/22  5:39 PM  Result Value Ref Range   POC Amphetamine UR None Detected NONE DETECTED (Cut Off Level 1000 ng/mL)   POC Secobarbital (BAR) None Detected NONE DETECTED (Cut Off Level 300 ng/mL)   POC Buprenorphine (BUP) None Detected NONE DETECTED (Cut Off Level 10 ng/mL)   POC Oxazepam (BZO) Positive (A) NONE DETECTED (Cut Off Level 300 ng/mL)   POC Cocaine UR None Detected NONE DETECTED (Cut Off Level 300 ng/mL)   POC Methamphetamine UR None Detected NONE DETECTED (Cut Off Level 1000 ng/mL)   POC Morphine None Detected NONE DETECTED (Cut Off Level 300 ng/mL)   POC Methadone UR None Detected NONE DETECTED (Cut Off Level 300 ng/mL)   POC Oxycodone  UR None Detected NONE DETECTED (Cut Off Level 100 ng/mL)   POC Marijuana UR Positive (A) NONE  DETECTED (Cut Off Level 50 ng/mL)  POC SARS Coronavirus 2 Ag     Status: None   Collection Time: 03/10/22  5:53 PM  Result Value Ref Range   SARSCOV2ONAVIRUS 2 AG NEGATIVE NEGATIVE    Comment: (NOTE) SARS-CoV-2 antigen NOT DETECTED.   Negative results are presumptive.  Negative results do not preclude SARS-CoV-2 infection and should not be used as the sole basis for treatment or other patient management decisions, including infection  control decisions, particularly in the presence of clinical signs and  symptoms consistent with COVID-19, or in those who have been in contact with the virus.  Negative results must be combined with clinical observations, patient history, and epidemiological information. The expected result is Negative.  Fact Sheet for Patients: https://www.jennings-kim.com/https://www.fda.gov/media/141569/download  Fact Sheet for Healthcare Providers: https://alexander-rogers.biz/https://www.fda.gov/media/141568/download  This test is not yet approved or cleared by the [place] FDA and  has been authorized for detection and/or diagnosis of SARS-CoV-2 by FDA under an Emergency Use Authorization (EUA).  This EUA will remain in effect (meaning this test can be used) for the duration of  the COV ID-19 declaration under Section 564(b)(1) of the Act, 21 U.S.C. section 360bbb-3(b)(1), unless the authorization is terminated or revoked sooner.      Blood Alcohol level:  Lab Results  Component Value Date   ETH <10 03/10/2022   ETH <10 11/28/2019    Metabolic Disorder Labs:  Lab Results  Component Value Date   HGBA1C 5.9 (H) 03/10/2022   MPG 123 03/10/2022   Lab Results  Component Value Date   PROLACTIN 19.4 03/10/2022   Lab Results  Component Value Date   CHOL 233 (H) 03/10/2022   TRIG 183 (H) 03/10/2022   HDL 87 03/10/2022   CHOLHDL 2.7 03/10/2022   VLDL 37 03/10/2022   LDLCALC 109 (H) 03/10/2022    Current Medications: Current Facility-Administered Medications  Medication Dose Route Frequency  Provider Last Rate Last Admin   acetaminophen (TYLENOL) tablet 650 mg  650 mg Oral Q6H PRN Ramina Hulet, Jackquline DenmarkJohn T, MD       alum & mag hydroxide-simeth (MAALOX/MYLANTA) 200-200-20 MG/5ML suspension 30 mL  30 mL Oral Q4H PRN Mikala Podoll, Jackquline DenmarkJohn T, MD       dicyclomine (BENTYL) tablet 40 mg  40 mg Oral QID PRN Pricilla Moehle, Jackquline DenmarkJohn T, MD       diphenoxylate-atropine (LOMOTIL) 2.5-0.025 MG per tablet 1 tablet  1 tablet Oral QID PRN Nitesh Pitstick, Jackquline DenmarkJohn T, MD       dronabinol (MARINOL) capsule 5 mg  5 mg Oral BID Vira Chaplin, Jackquline DenmarkJohn T, MD       Melene Muller[START ON 03/12/2022] drospirenone-ethinyl estradiol (YAZ) 3-0.02 MG per tablet 1 tablet  1 tablet Oral Q lunch Terrah Decoster T, MD       LORazepam (ATIVAN) tablet 1 mg  1 mg Oral TID Mahala Rommel T, MD       magnesium hydroxide (MILK OF MAGNESIA) suspension 30 mL  30 mL Oral Daily PRN Loreli Debruler T, MD       NON FORMULARY 40 mg  40 mg Oral Daily Temesgen Weightman T, MD       ondansetron (ZOFRAN-ODT) disintegrating tablet 8 mg  8 mg Oral Q8H PRN Josede Cicero T, MD       pantoprazole (PROTONIX) EC tablet 80 mg  80 mg Oral BID Aerie Donica, Jackquline DenmarkJohn T, MD  propranolol (INDERAL) tablet 10 mg  10 mg Oral TID Nylah Butkus, Jackquline Denmark, MD       rosuvastatin (CRESTOR) tablet 10 mg  10 mg Oral QHS Jakiera Ehler, Jackquline Denmark, MD       [START ON 03/12/2022] spironolactone (ALDACTONE) tablet 100 mg  100 mg Oral Q1200 Raeden Schippers, Jackquline Denmark, MD       SUMAtriptan (IMITREX) tablet 50 mg  50 mg Oral Daily PRN Jonell Krontz, Jackquline Denmark, MD       vitamin B-12 (CYANOCOBALAMIN) tablet 1,000 mcg  1,000 mcg Oral Daily Deshaun Weisinger, Jackquline Denmark, MD       Vitamin D3 (Vitamin D) tablet 1,000 Units  1,000 Units Oral Daily Daylee Delahoz, Jackquline Denmark, MD       zolpidem (AMBIEN) tablet 10 mg  10 mg Oral QHS Lashan Gluth, Jackquline Denmark, MD       PTA Medications: Medications Prior to Admission  Medication Sig Dispense Refill Last Dose   dicyclomine (BENTYL) 20 MG tablet Take 40 mg by mouth 4 (four) times daily as needed for spasms.      diphenoxylate-atropine (LOMOTIL) 2.5-0.025 MG tablet Take  1 tablet by mouth 4 (four) times daily as needed for diarrhea or loose stools.      dronabinol (MARINOL) 5 MG capsule Take 5 mg by mouth 2 (two) times daily.      drospirenone-ethinyl estradiol (YAZ) 3-0.02 MG tablet Take 1 tablet by mouth daily with lunch.      LATUDA 40 MG TABS tablet Take 40 mg by mouth See admin instructions. Take 40 mg by mouth at 6 PM      LORazepam (ATIVAN) 1 MG tablet Take 1 mg by mouth every 6 (six) hours.      ondansetron (ZOFRAN-ODT) 8 MG disintegrating tablet Take 8 mg by mouth every 8 (eight) hours as needed for nausea (DISSOLVE ORALLY).      pantoprazole (PROTONIX) 40 MG tablet Take 80 mg by mouth 2 (two) times daily.      propranolol (INDERAL) 10 MG tablet Take 10 mg by mouth 4 (four) times daily.      rizatriptan (MAXALT) 10 MG tablet Take 10 mg by mouth as needed for migraine (and may repeat once in 2 hours, if no relief).      rosuvastatin (CRESTOR) 10 MG tablet Take 10 mg by mouth at bedtime.      spironolactone (ALDACTONE) 50 MG tablet Take 100 mg by mouth daily at 12 noon.      zolpidem (AMBIEN CR) 12.5 MG CR tablet Take 12.5 mg by mouth at bedtime.       Musculoskeletal: Strength & Muscle Tone: within normal limits Gait & Station: normal Patient leans: N/A            Psychiatric Specialty Exam:  Presentation  General Appearance: Appropriate for Environment; Casual; Fairly Groomed  Eye Contact:Good  Speech:Clear and Coherent; Normal Rate  Speech Volume:Normal  Handedness:Right   Mood and Affect  Mood:Depressed; Anxious  Affect:Congruent   Thought Process  Thought Processes:Coherent; Goal Directed; Linear  Duration of Psychotic Symptoms: No data recorded Past Diagnosis of Schizophrenia or Psychoactive disorder: No  Descriptions of Associations:Intact  Orientation:Full (Time, Place and Person)  Thought Content:Logical  Hallucinations:Hallucinations: None  Ideas of Reference:None  Suicidal Thoughts:Suicidal Thoughts:  Yes, Active SI Active Intent and/or Plan: With Plan  Homicidal Thoughts:Homicidal Thoughts: No   Sensorium  Memory:Immediate Good; Recent Good; Remote Good  Judgment:Fair  Insight:Fair   Executive Functions  Concentration:Good  Attention Span:Good  Recall:Good  Progress Energy  of Knowledge:Good  Language:Good   Psychomotor Activity  Psychomotor Activity:Psychomotor Activity: Normal   Assets  Assets:Communication Skills; Desire for Improvement; Financial Resources/Insurance; Housing; Resilience; Social Support   Sleep  Sleep:Sleep: Fair    Physical Exam: Physical Exam Vitals and nursing note reviewed.  Constitutional:      Appearance: Normal appearance.  HENT:     Head: Normocephalic and atraumatic.     Mouth/Throat:     Pharynx: Oropharynx is clear.  Eyes:     Pupils: Pupils are equal, round, and reactive to light.  Cardiovascular:     Rate and Rhythm: Normal rate and regular rhythm.  Pulmonary:     Effort: Pulmonary effort is normal.     Breath sounds: Normal breath sounds.  Abdominal:     General: Abdomen is flat.     Palpations: Abdomen is soft.  Musculoskeletal:        General: Normal range of motion.  Skin:    General: Skin is warm and dry.  Neurological:     General: No focal deficit present.     Mental Status: She is alert. Mental status is at baseline.  Psychiatric:        Attention and Perception: Attention normal.        Mood and Affect: Mood is depressed. Affect is tearful.        Speech: Speech normal.        Behavior: Behavior is slowed.        Thought Content: Thought content includes suicidal ideation. Thought content does not include suicidal plan.        Cognition and Memory: Cognition normal.        Judgment: Judgment normal.    Review of Systems  Constitutional: Negative.   HENT: Negative.    Eyes: Negative.   Respiratory: Negative.    Cardiovascular: Negative.   Gastrointestinal:  Positive for heartburn.  Musculoskeletal:  Negative.   Skin: Negative.   Neurological:  Positive for headaches.  Psychiatric/Behavioral:  Positive for depression and suicidal ideas. Negative for hallucinations and substance abuse. The patient is nervous/anxious.    Blood pressure (!) 132/95, pulse 86, temperature 98.9 F (37.2 C), temperature source Oral, resp. rate 18, height  (1.6 m), weight 64 kg, SpO2 99 %. Body mass index is 24.98 kg/m.  Treatment Plan Summary: Medication management and Plan reviewed medication list with patient.  Fortunately she brought some brand name Latuda from home which we can continue.  Continue other medicines as prescribed.  Patient is agreeable to recommendation for ECT.  I would recommend bilateral ECT starting tomorrow.  Reviewed plan and side effects with patient and she is agreeable.  Orders placed to be n.p.o. overnight.  No new labs needed further at this point.  We reviewed the Ativan which we may want to hold at least in the morning.  Observation Level/Precautions:  15 minute checks  Laboratory:  Chemistry Profile  Psychotherapy:    Medications:    Consultations:    Discharge Concerns:    Estimated LOS:  Other:     Physician Treatment Plan for Primary Diagnosis: Severe recurrent major depression without psychotic features (HCC) Long Term Goal(s): Improvement in symptoms so as ready for discharge  Short Term Goals: Ability to verbalize feelings will improve, Ability to disclose and discuss suicidal ideas, and Ability to demonstrate self-control will improve  Physician Treatment Plan for Secondary Diagnosis: Principal Problem:   Severe recurrent major depression without psychotic features (HCC) Active Problems:   Chronic daily  headache   Hidradenitis suppurativa   Gastric reflux  Long Term Goal(s): Improvement in symptoms so as ready for discharge  Short Term Goals: Ability to maintain clinical measurements within normal limits will improve  I certify that inpatient services  furnished can reasonably be expected to improve the patient's condition.    Mordecai Rasmussen, MD 7/25/20234:28 PM

## 2022-03-11 NOTE — ED Notes (Signed)
Patient discharge via ambulatory with a steady gait with [facility] staff member. Respirations equal and unlabored, skin warm and dry. No acute distress noted.  

## 2022-03-11 NOTE — Progress Notes (Signed)
Patient presents voluntary from [facility]. Patient endorses worsening depression over the last couple of months, but especially after her medications were changed. She states that she had been doing better on Latuda, but the prescription was changed to the generic form of Latuda, which she says did not work. She says it was then changed to Pristiq, which made her nauseated. When her medication was changed back to [place], she says it was not working like it had been before. She currently endorses SI with plan to overdose, but contracts for safety while in the hospital. She denies HI and AVH. Patient endorses headache and nausea, but denies needing any PRN medication for it at this time.  Skin assessment done with Wendall Mola, RN. No contraband or skin issues found.Patient is pleasant and cooperative. She remains safe on the unit at this time.

## 2022-03-11 NOTE — Progress Notes (Signed)
CSW received accepting information from [facility]. Please see Jasmine Pang Stevenson's note for details:  Patient has been accepted to [facility].  Accepted by Dr. Toni Amend.  Attending  Physician will be Dr. Toni Amend.  Patient has been assigned to room 306, by [facility] Charge Nurse Adela Glimpse   Call report to [phone].  Representative/Transfer Coordinator is Psychologist, prison and probation services Patient pre-admitted by [facility] Patient Access [facility] [place])   [facility] Staff [facility], Disposition Social Worker) made aware of acceptance.   Maryjean Ka, MSW, LCSWA 03/11/2022 1:30 PM

## 2022-03-11 NOTE — BH Assessment (Addendum)
Patient has been accepted to [facility].  Accepted by Dr. Toni Amend.  Attending  Physician will be Dr. Toni Amend.  Patient has been assigned to room 306, by [facility] Charge Nurse Adela Glimpse   Call report to [phone].  Representative/Transfer Coordinator is Psychologist, prison and probation services Patient pre-admitted by [facility] Patient Access [facility] [place])   [facility] Staff [facility], Disposition Social Worker) made aware of acceptance.     ETA: Patient can arrive by 2:00pm.

## 2022-03-11 NOTE — ED Notes (Signed)
Patient resting quietly in bed watching TV. Respirations equal and unlabored, skin warm and dry, NAD. No change in assessment or acuity. Routine safety checks conducted according to facility protocol. Will continue to monitor for safety.   

## 2022-03-11 NOTE — ED Notes (Signed)
[facility] contacted and reported it would be a delay in transport because she was on her way to [place] with a client. Wendall Mola , RN at [facility] notified of the delay. Will keep Wendall Mola, RN at [facility] informed.

## 2022-03-11 NOTE — [facility] Suicide Risk Assessment (Signed)
[facility] Admission Suicide Risk Assessment   Nursing information obtained from:    Demographic factors:  Living alone, Unemployed, Caucasian Current Mental Status:  Suicide plan Loss Factors:  NA Historical Factors:  Prior suicide attempts Risk Reduction Factors:  Sense of responsibility to family  Total Time spent with patient: 1 hour Principal Problem: Severe recurrent major depression without psychotic features (HCC) Diagnosis:  Principal Problem:   Severe recurrent major depression without psychotic features (HCC) Active Problems:   Chronic daily headache   Hidradenitis suppurativa   Gastric reflux  Subjective Data: Patient seen and chart reviewed.  41 year old woman with history of recurrent severe major depression with a past history of suicide attempts came to the hospital with worsening suicidal ideation.  On interview today she endorses multiple symptoms of severe depression.  Has had suicidal thoughts but has no intention or plan of acting on it right now and is cooperative and appropriate with treatment planning and does not appear to be psychotic  Continued Clinical Symptoms:  Alcohol Use Disorder Identification Test Final Score (AUDIT): 2 The "Alcohol Use Disorders Identification Test", Guidelines for Use in Primary Care, Second Edition.  World Science writer [facility]). Score between 0-7:  no or low risk or alcohol related problems. Score between 8-15:  moderate risk of alcohol related problems. Score between 16-19:  high risk of alcohol related problems. Score 20 or above:  warrants further diagnostic evaluation for alcohol dependence and treatment.   CLINICAL FACTORS:   Depression:   Anhedonia Hopelessness Severe   Musculoskeletal: Strength & Muscle Tone: within normal limits Gait & Station: normal Patient leans: N/A  Psychiatric Specialty Exam:  Presentation  General Appearance: Appropriate for Environment; Casual; Fairly Groomed  Eye  Contact:Good  Speech:Clear and Coherent; Normal Rate  Speech Volume:Normal  Handedness:Right   Mood and Affect  Mood:Depressed; Anxious  Affect:Congruent   Thought Process  Thought Processes:Coherent; Goal Directed; Linear  Descriptions of Associations:Intact  Orientation:Full (Time, Place and Person)  Thought Content:Logical  History of Schizophrenia/Schizoaffective disorder:No  Duration of Psychotic Symptoms:No data recorded Hallucinations:Hallucinations: None  Ideas of Reference:None  Suicidal Thoughts:Suicidal Thoughts: Yes, Active SI Active Intent and/or Plan: With Plan  Homicidal Thoughts:Homicidal Thoughts: No   Sensorium  Memory:Immediate Good; Recent Good; Remote Good  Judgment:Fair  Insight:Fair   Executive Functions  Concentration:Good  Attention Span:Good  Recall:Good  Fund of Knowledge:Good  Language:Good   Psychomotor Activity  Psychomotor Activity:Psychomotor Activity: Normal   Assets  Assets:Communication Skills; Desire for Improvement; Financial Resources/Insurance; Housing; Resilience; Social Support   Sleep  Sleep:Sleep: Fair    Physical Exam: Physical Exam Vitals and nursing note reviewed.  Constitutional:      Appearance: Normal appearance.  HENT:     Head: Normocephalic and atraumatic.     Mouth/Throat:     Pharynx: Oropharynx is clear.  Eyes:     Pupils: Pupils are equal, round, and reactive to light.  Cardiovascular:     Rate and Rhythm: Normal rate and regular rhythm.  Pulmonary:     Effort: Pulmonary effort is normal.     Breath sounds: Normal breath sounds.  Abdominal:     General: Abdomen is flat.     Palpations: Abdomen is soft.  Musculoskeletal:        General: Normal range of motion.  Skin:    General: Skin is warm and dry.  Neurological:     General: No focal deficit present.     Mental Status: She is alert. Mental status is at baseline.  Psychiatric:        Attention and Perception:  Attention normal.        Mood and Affect: Mood is depressed. Affect is tearful.        Speech: Speech normal.        Behavior: Behavior is cooperative.        Thought Content: Thought content includes suicidal ideation. Thought content does not include suicidal plan.        Cognition and Memory: Cognition normal.        Judgment: Judgment normal.    Review of Systems  Constitutional: Negative.   HENT: Negative.    Eyes: Negative.   Respiratory: Negative.    Cardiovascular: Negative.   Gastrointestinal:  Positive for heartburn and nausea.  Musculoskeletal: Negative.   Skin: Negative.   Neurological:  Positive for headaches.  Psychiatric/Behavioral:  Positive for depression and suicidal ideas. Negative for hallucinations, memory loss and substance abuse. The patient is nervous/anxious. The patient does not have insomnia.    Blood pressure (!) 132/95, pulse 86, temperature 98.9 F (37.2 C), temperature source Oral, resp. rate 18, height 5\' 3"  (1.6 m), weight 64 kg, SpO2 99 %. Body mass index is 24.98 kg/m.   COGNITIVE FEATURES THAT CONTRIBUTE TO RISK:  Thought constriction (tunnel vision)    SUICIDE RISK:   Mild:  Suicidal ideation of limited frequency, intensity, duration, and specificity.  There are no identifiable plans, no associated intent, mild dysphoria and related symptoms, good self-control (both objective and subjective assessment), few other risk factors, and identifiable protective factors, including available and accessible social support.  PLAN OF CARE: Continue 15-minute checks.  At patient's agreement we are planning to initiate electroconvulsive therapy starting tomorrow.  Reviewed all medicines with her.  We will try to continue her medicines here as she had been previously taking them at home.  Engage in individual and group therapy.  Ongoing assessment of dangerousness prior to discharge  I certify that inpatient services furnished can reasonably be expected to  improve the patient's condition.   , MD 03/11/2022, 4:25 PM

## 2022-03-11 NOTE — ED Notes (Signed)
Pt A&O x 4, continues to endorse suicidal ideations, plan to overdose on meds.  Denies HI or AVH.  Pt anxious & cooperative, monitoring for safety.

## 2022-03-12 ENCOUNTER — Other Ambulatory Visit: Payer: Self-pay | Admitting: Psychiatry

## 2022-03-12 ENCOUNTER — Inpatient Hospital Stay: Payer: PPO | Admitting: Certified Registered Nurse Anesthetist

## 2022-03-12 ENCOUNTER — Ambulatory Visit: Payer: PPO

## 2022-03-12 ENCOUNTER — Encounter: Payer: Self-pay | Admitting: Psychiatry

## 2022-03-12 DIAGNOSIS — F332 Major depressive disorder, recurrent severe without psychotic features: Secondary | ICD-10-CM | POA: Diagnosis not present

## 2022-03-12 DIAGNOSIS — R200 Anesthesia of skin: Secondary | ICD-10-CM

## 2022-03-12 LAB — GLUCOSE, CAPILLARY: Glucose-Capillary: 101 mg/dL — ABNORMAL HIGH (ref 70–99)

## 2022-03-12 MED ORDER — SUCCINYLCHOLINE CHLORIDE 200 MG/10ML IV SOSY
PREFILLED_SYRINGE | INTRAVENOUS | Status: DC | PRN
  Administered 2022-03-12: 80 mg via INTRAVENOUS

## 2022-03-12 MED ORDER — ZOLPIDEM TARTRATE 5 MG PO TABS
10.0000 mg | ORAL_TABLET | Freq: Every day | ORAL | Status: DC
  Administered 2022-03-12 – 2022-03-16 (×5): 10 mg via ORAL
  Filled 2022-03-12 (×5): qty 2

## 2022-03-12 MED ORDER — FENTANYL CITRATE (PF) 100 MCG/2ML IJ SOLN: 25.0000 ug | INTRAMUSCULAR | Status: DC | PRN

## 2022-03-12 MED ORDER — MIDAZOLAM HCL 2 MG/2ML IJ SOLN
INTRAMUSCULAR | Status: AC
  Filled 2022-03-12: qty 2

## 2022-03-12 MED ORDER — ONDANSETRON HCL 4 MG/2ML IJ SOLN: 4.0000 mg | Freq: Once | INTRAMUSCULAR | Status: AC | PRN

## 2022-03-12 MED ORDER — METHOHEXITAL SODIUM 100 MG/10ML IV SOSY
PREFILLED_SYRINGE | INTRAVENOUS | Status: DC | PRN
  Administered 2022-03-12: 70 mg via INTRAVENOUS

## 2022-03-12 MED ORDER — MIDAZOLAM HCL 2 MG/2ML IJ SOLN
INTRAMUSCULAR | Status: DC | PRN
  Administered 2022-03-12: 2 mg via INTRAVENOUS

## 2022-03-12 MED ORDER — LABETALOL HCL 5 MG/ML IV SOLN
INTRAVENOUS | Status: DC | PRN
  Administered 2022-03-12: 5 mg via INTRAVENOUS

## 2022-03-12 NOTE — Progress Notes (Signed)
Recreation Therapy Notes  Date: 03/12/2022  Time: 10:40 am    Location: [facility] yard   Behavioral response: N/A   Intervention Topic: Honesty   Discussion/Intervention: Patient refused to attend group.   Clinical Observations/Feedback:  Patient refused to attend group.    Mekaylah Klich LRT/CTRS        Angelmarie Ponzo 03/12/2022 12:45 PM

## 2022-03-12 NOTE — Anesthesia Preprocedure Evaluation (Signed)
Anesthesia Evaluation  Patient identified by MRN, date of birth, ID band Patient awake    Reviewed: Allergy & Precautions, NPO status , Patient's Chart, lab work & pertinent test results  History of Anesthesia Complications (+) PONV and history of anesthetic complications  Airway Mallampati: II  TM Distance: >3 FB Neck ROM: full    Dental  (+) Teeth Intact   Pulmonary neg pulmonary ROS, former smoker,    Pulmonary exam normal breath sounds clear to auscultation       Cardiovascular Exercise Tolerance: Good negative cardio ROS Normal cardiovascular exam Rhythm:Regular     Neuro/Psych  Headaches, Depression Bipolar Disorder negative neurological ROS  negative psych ROS   GI/Hepatic negative GI ROS, Neg liver ROS, GERD  Medicated,  Endo/Other  negative endocrine ROS  Renal/GU negative Renal ROS  negative genitourinary   Musculoskeletal  (+) Arthritis ,   Abdominal Normal abdominal exam  (+)   Peds negative pediatric ROS (+)  Hematology negative hematology ROS (+)   Anesthesia Other Findings Past Medical History: No date: Anxiety No date: Anxiety No date: Bipolar 1 disorder (HCC) No date: Chiari malformation No date: Complication of anesthesia No date: Depression No date: GERD (gastroesophageal reflux disease) No date: MDD (major depressive disorder) No date: Microscopic colitis No date: Migraine headache No date: PONV (postoperative nausea and vomiting)  Past Surgical History: No date: APPENDECTOMY No date: CHOLECYSTECTOMY 10/28/2021: INCISION AND DRAINAGE OF WOUND; Left     Comment:  Procedure: KNEE ARTHROSCOPY WITH IRRIGATION AND               DEBRIDEMENT;  Surgeon: Eugenia Mcalpine, MD;  Location: [facility]              ORS;  Service: Orthopedics;  Laterality: Left;  60 okay               per OR No date: KNEE SURGERY; Left     Comment:  3 times No date: MANDIBLE SURGERY  BMI    Body Mass Index: 24.98  kg/m      Reproductive/Obstetrics negative OB ROS                             Anesthesia Physical Anesthesia Plan  ASA: 2  Anesthesia Plan: General   Post-op Pain Management:    Induction: Intravenous  PONV Risk Score and Plan: Propofol infusion and TIVA  Airway Management Planned: Natural Airway and Mask  Additional Equipment:   Intra-op Plan:   Post-operative Plan:   Informed Consent: I have reviewed the patients History and Physical, chart, labs and discussed the procedure including the risks, benefits and alternatives for the proposed anesthesia with the patient or authorized representative who has indicated his/her understanding and acceptance.     Dental Advisory Given  Plan Discussed with: Anesthesiologist, CRNA and Surgeon  Anesthesia Plan Comments:         Anesthesia Quick Evaluation

## 2022-03-12 NOTE — Plan of Care (Signed)
Patient had ECT today. Patient states that she feels " low " after ECT and have sore on both sides of her face. Patient appears sad and verbalized passive suicidal thoughts.Patient contracts safety in the unit. Denies HI and AVH. Patient appropriate with staff & peers. Attended group. Appetite good. Support and encouragement given.

## 2022-03-12 NOTE — H&P (Signed)
Kayla Holden is an 41 y.o. female.   Chief Complaint: ongoing depression HPI: severe chronic depression  Past Medical History:  Diagnosis Date   Anxiety    Anxiety    Bipolar 1 disorder (HCC)    Chiari malformation    Complication of anesthesia    Depression    GERD (gastroesophageal reflux disease)    MDD (major depressive disorder)    Microscopic colitis    Migraine headache    PONV (postoperative nausea and vomiting)     Past Surgical History:  Procedure Laterality Date   APPENDECTOMY     CHOLECYSTECTOMY     INCISION AND DRAINAGE OF WOUND Left 10/28/2021   Procedure: KNEE ARTHROSCOPY WITH IRRIGATION AND DEBRIDEMENT;  Surgeon: Eugenia Mcalpine, MD;  Location: [facility] ORS;  Service: Orthopedics;  Laterality: Left;  60 okay per OR   KNEE SURGERY Left    3 times   MANDIBLE SURGERY      Family History  Problem Relation Age of Onset   Cancer Maternal Grandmother    Cancer Paternal Grandfather    Allergic rhinitis Neg Hx    Angioedema Neg Hx    Asthma Neg Hx    Atopy Neg Hx    Eczema Neg Hx    Urticaria Neg Hx    Immunodeficiency Neg Hx    Social History:  reports that she has quit smoking. Her smoking use included cigarettes. She has never used smokeless tobacco. She reports current drug use. Drug: Marijuana. She reports that she does not drink alcohol.  Allergies:  Allergies  Allergen Reactions   Codeine Other (See Comments)    Patient has "CYP2D6," so NO CODEINE   Fioricet-Codeine [Butalbital-Apap-Caff-Cod] Other (See Comments)    Patient has "CYP2D6" and cannot tolerate codeine, palpitations, rapid heart rate   Isoniazid Shortness Of Breath   Ketorolac Tromethamine Other (See Comments)    Abdominal Pain   Rifampin Shortness Of Breath and Hives   Tape Other (See Comments)    Paper tape causes blisters    Clindamycin Diarrhea and Other (See Comments)    Abdominal pain, also   Ketoprofen Other (See Comments)    Abdominal pain and cannot take due to history of  colitis   Nsaids Other (See Comments)    History of colitis    Medications Prior to Admission  Medication Sig Dispense Refill   desvenlafaxine (PRISTIQ) 50 MG 24 hr tablet Take 50 mg by mouth daily.     dicyclomine (BENTYL) 20 MG tablet Take 40 mg by mouth 4 (four) times daily as needed for spasms.     diphenoxylate-atropine (LOMOTIL) 2.5-0.025 MG tablet Take 1 tablet by mouth 4 (four) times daily as needed for diarrhea or loose stools.     dronabinol (MARINOL) 5 MG capsule Take 5 mg by mouth 2 (two) times daily.     drospirenone-ethinyl estradiol (YAZ) 3-0.02 MG tablet Take 1 tablet by mouth daily with lunch.     LATUDA 40 MG TABS tablet Take 40 mg by mouth See admin instructions. Take 40 mg by mouth at 6 PM     LORazepam (ATIVAN) 1 MG tablet Take 1 mg by mouth every 6 (six) hours.     ondansetron (ZOFRAN-ODT) 8 MG disintegrating tablet Take 8 mg by mouth every 8 (eight) hours as needed for nausea (DISSOLVE ORALLY).     pantoprazole (PROTONIX) 40 MG tablet Take 80 mg by mouth 2 (two) times daily.     propranolol (INDERAL) 10 MG tablet Take 10  mg by mouth 4 (four) times daily.     rizatriptan (MAXALT) 10 MG tablet Take 10 mg by mouth as needed for migraine (and may repeat once in 2 hours, if no relief).     rosuvastatin (CRESTOR) 10 MG tablet Take 10 mg by mouth at bedtime.     spironolactone (ALDACTONE) 50 MG tablet Take 100 mg by mouth daily at 12 noon.     zolpidem (AMBIEN CR) 12.5 MG CR tablet Take 12.5 mg by mouth at bedtime.      Results for orders placed or performed during the hospital encounter of 03/11/22 (from the past 48 hour(s))  Glucose, capillary     Status: Abnormal   Collection Time: 03/12/22  6:55 AM  Result Value Ref Range   Glucose-Capillary 101 (H) 70 - 99 mg/dL    Comment: Glucose reference range applies only to samples taken after fasting for at least 8 hours.   No results found.  Review of Systems  Constitutional: Negative.   HENT: Negative.    Eyes:  Negative.   Respiratory: Negative.    Cardiovascular: Negative.   Gastrointestinal: Negative.   Musculoskeletal: Negative.   Skin: Negative.   Neurological: Negative.   Psychiatric/Behavioral:  Positive for dysphoric mood and suicidal ideas.     Blood pressure (!) 120/92, pulse 94, temperature 98.7 F (37.1 C), temperature source Oral, resp. rate 18, height 5\' 3"  (1.6 m), weight 64 kg, SpO2 98 %. Physical Exam Vitals and nursing note reviewed.  Constitutional:      Appearance: She is well-developed.  HENT:     Head: Normocephalic and atraumatic.  Eyes:     Conjunctiva/sclera: Conjunctivae normal.     Pupils: Pupils are equal, round, and reactive to light.  Cardiovascular:     Heart sounds: Normal heart sounds.  Pulmonary:     Effort: Pulmonary effort is normal.  Abdominal:     Palpations: Abdomen is soft.  Musculoskeletal:        General: Normal range of motion.     Cervical back: Normal range of motion.  Skin:    General: Skin is warm and dry.  Neurological:     General: No focal deficit present.     Mental Status: She is alert.  Psychiatric:        Attention and Perception: Attention normal.        Mood and Affect: Mood is depressed.        Speech: Speech normal.        Behavior: Behavior normal.        Thought Content: Thought content normal.        Cognition and Memory: Cognition normal.        Judgment: Judgment normal.      Assessment/Plan Start index ect  , MD 03/12/2022, 11:36 AM

## 2022-03-12 NOTE — Anesthesia Postprocedure Evaluation (Signed)
Anesthesia Post Note  Patient: Kayla Holden  Procedure(s) Performed: ECT TX  Patient location during evaluation: PACU Anesthesia Type: General Level of consciousness: awake Pain management: satisfactory to patient Vital Signs Assessment: post-procedure vital signs reviewed and stable Respiratory status: spontaneous breathing and respiratory function stable Cardiovascular status: stable Anesthetic complications: no   No notable events documented.   Last Vitals:  Vitals:   03/12/22 1219 03/12/22 1220  BP: (!) 154/96 (!) 154/96  Pulse: 83 66  Resp: 15 (!) 29  Temp: 36.8 C   SpO2: 100% 97%    Last Pain:  Vitals:   03/12/22 1219  TempSrc:   PainSc: 0-No pain                 VAN STAVEREN,Kinsley Nicklaus

## 2022-03-12 NOTE — Group Note (Signed)
[facility] LCSW Group Therapy Note   Group Date: 03/12/2022 Start Time: 1300 End Time: 1400   Type of Therapy/Topic:  Group Therapy:  Emotion Regulation  Participation Level:  Did Not Attend    Description of Group:    The purpose of this group is to assist patients in learning to regulate negative emotions and experience positive emotions. Patients will be guided to discuss ways in which they have been vulnerable to their negative emotions. These vulnerabilities will be juxtaposed with experiences of positive emotions or situations, and patients challenged to use positive emotions to combat negative ones. Special emphasis will be placed on coping with negative emotions in conflict situations, and patients will process healthy conflict resolution skills.  Therapeutic Goals: Patient will identify two positive emotions or experiences to reflect on in order to balance out negative emotions:  Patient will label two or more emotions that they find the most difficult to experience:  Patient will be able to demonstrate positive conflict resolution skills through discussion or role plays:   Summary of Patient Progress: Pt declined to attend despite CSW invitation.   Therapeutic Modalities:   Cognitive Behavioral Therapy Feelings Identification Dialectical Behavioral Therapy   Kassim Guertin R Tarvis Blossom, LCSW 

## 2022-03-12 NOTE — Progress Notes (Signed)
Education provided to the patient regarding the possibility of incontinence due to the administration of anesthesia. Recommendation was made for the patient to apply an incontinence pad to prevent soiling clothing. Patient verbalized understanding of the education and stated they choose not to apply and incontinence pad for this visit.   
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
## 2022-03-12 NOTE — [facility] Suicide Risk Assessment (Signed)
[facility] INPATIENT:  Family/Significant Other Suicide Prevention Education  Suicide Prevention Education:  Patient Refusal for Family/Significant Other Suicide Prevention Education: The patient Kayla Holden has refused to provide written consent for family/significant other to be provided Family/Significant Other Suicide Prevention Education during admission and/or prior to discharge.  Physician notified.  SPE completed with pt, as pt refused to consent to family contact. SPI pamphlet provided to pt and pt was encouraged to share information with support network, ask questions, and talk about any concerns relating to SPE. Pt denies access to guns/firearms and verbalized understanding of information provided. Mobile Crisis information also provided to pt.  Glenis Smoker 03/12/2022, 2:39 PM

## 2022-03-12 NOTE — BH IP Treatment Plan (Signed)
Interdisciplinary Treatment and Diagnostic Plan Update  03/12/2022 Time of Session: 9:30 AM Kayla Holden MRN: [id number]  Principal Diagnosis: Severe recurrent major depression without psychotic features [facility])  Secondary Diagnoses: Principal Problem:   Severe recurrent major depression without psychotic features (HCC) Active Problems:   Chronic daily headache   Hidradenitis suppurativa   Gastric reflux   Current Medications:  Current Facility-Administered Medications  Medication Dose Route Frequency Provider Last Rate Last Admin   0.9 %  sodium chloride infusion  500 mL Intravenous Once Clapacs, Jackquline Denmark, MD       acetaminophen (TYLENOL) tablet 650 mg  650 mg Oral Q6H PRN Clapacs, Jackquline Denmark, MD       alum & mag hydroxide-simeth (MAALOX/MYLANTA) 200-200-20 MG/5ML suspension 30 mL  30 mL Oral Q4H PRN Clapacs, John T, MD       cholecalciferol (VITAMIN D) tablet 1,000 Units  1,000 Units Oral Daily Clapacs, Jackquline Denmark, MD       dicyclomine (BENTYL) tablet 40 mg  40 mg Oral QID PRN Clapacs, Jackquline Denmark, MD       diphenoxylate-atropine (LOMOTIL) 2.5-0.025 MG per tablet 1 tablet  1 tablet Oral QID PRN Clapacs, Jackquline Denmark, MD       dronabinol (MARINOL) capsule 5 mg  5 mg Oral BID Clapacs, Jackquline Denmark, MD   5 mg at 03/12/22 7169   drospirenone-ethinyl estradiol (YAZ) 3-0.02 MG per tablet 1 tablet  1 tablet Oral Q lunch Clapacs, John T, MD       LORazepam (ATIVAN) tablet 1 mg  1 mg Oral TID Clapacs, John T, MD   1 mg at 03/11/22 1753   lurasidone (LATUDA) tablet 40 mg  40 mg Oral Q breakfast Clapacs, John T, MD   40 mg at 03/11/22 1649   magnesium hydroxide (MILK OF MAGNESIA) suspension 30 mL  30 mL Oral Daily PRN Clapacs, John T, MD       midazolam (VERSED) injection 2 mg  2 mg Intravenous Once Clapacs, John T, MD       ondansetron (ZOFRAN-ODT) disintegrating tablet 8 mg  8 mg Oral Q8H PRN Clapacs, John T, MD   8 mg at 03/12/22 0232   pantoprazole (PROTONIX) EC tablet 80 mg  80 mg Oral BID Clapacs, John T, MD   80  mg at 03/12/22 0851   propranolol (INDERAL) tablet 10 mg  10 mg Oral TID Clapacs, Jackquline Denmark, MD   10 mg at 03/12/22 0851   rosuvastatin (CRESTOR) tablet 10 mg  10 mg Oral QHS Clapacs, John T, MD   10 mg at 03/11/22 2132   spironolactone (ALDACTONE) tablet 100 mg  100 mg Oral Q1200 Clapacs, Jackquline Denmark, MD       SUMAtriptan (IMITREX) tablet 50 mg  50 mg Oral Daily PRN Clapacs, Jackquline Denmark, MD   50 mg at 03/12/22 6789   vitamin B-12 (CYANOCOBALAMIN) tablet 1,000 mcg  1,000 mcg Oral Daily Clapacs, Jackquline Denmark, MD       zolpidem (AMBIEN) tablet 5 mg  5 mg Oral QHS Clapacs, John T, MD   5 mg at 03/11/22 2132   PTA Medications: Medications Prior to Admission  Medication Sig Dispense Refill Last Dose   desvenlafaxine (PRISTIQ) 50 MG 24 hr tablet Take 50 mg by mouth daily.   03/10/2022   dicyclomine (BENTYL) 20 MG tablet Take 40 mg by mouth 4 (four) times daily as needed for spasms.   Past Week   diphenoxylate-atropine (LOMOTIL) 2.5-0.025 MG tablet Take 1  tablet by mouth 4 (four) times daily as needed for diarrhea or loose stools.   03/11/2022   dronabinol (MARINOL) 5 MG capsule Take 5 mg by mouth 2 (two) times daily.   03/10/2022   drospirenone-ethinyl estradiol (YAZ) 3-0.02 MG tablet Take 1 tablet by mouth daily with lunch.   03/10/2022   LATUDA 40 MG TABS tablet Take 40 mg by mouth See admin instructions. Take 40 mg by mouth at 6 PM   03/10/2022   LORazepam (ATIVAN) 1 MG tablet Take 1 mg by mouth every 6 (six) hours.   03/11/2022   ondansetron (ZOFRAN-ODT) 8 MG disintegrating tablet Take 8 mg by mouth every 8 (eight) hours as needed for nausea (DISSOLVE ORALLY).   03/11/2022   pantoprazole (PROTONIX) 40 MG tablet Take 80 mg by mouth 2 (two) times daily.   03/10/2022   propranolol (INDERAL) 10 MG tablet Take 10 mg by mouth 4 (four) times daily.   03/11/2022   rizatriptan (MAXALT) 10 MG tablet Take 10 mg by mouth as needed for migraine (and may repeat once in 2 hours, if no relief).   03/09/2022   rosuvastatin (CRESTOR) 10 MG  tablet Take 10 mg by mouth at bedtime.   03/10/2022   spironolactone (ALDACTONE) 50 MG tablet Take 100 mg by mouth daily at 12 noon.   03/10/2022   zolpidem (AMBIEN CR) 12.5 MG CR tablet Take 12.5 mg by mouth at bedtime.   03/10/2022    Patient Stressors:    Patient Strengths:    Treatment Modalities: Medication Management, Group therapy, Case management,  1 to 1 session with clinician, Psychoeducation, Recreational therapy.   Physician Treatment Plan for Primary Diagnosis: Severe recurrent major depression without psychotic features (HCC) Long Term Goal(s): Improvement in symptoms so as ready for discharge   Short Term Goals: Ability to maintain clinical measurements within normal limits will improve Ability to verbalize feelings will improve Ability to disclose and discuss suicidal ideas Ability to demonstrate self-control will improve  Medication Management: Evaluate patient's response, side effects, and tolerance of medication regimen.  Therapeutic Interventions: 1 to 1 sessions, Unit Group sessions and Medication administration.  Evaluation of Outcomes: Progressing  Physician Treatment Plan for Secondary Diagnosis: Principal Problem:   Severe recurrent major depression without psychotic features (HCC) Active Problems:   Chronic daily headache   Hidradenitis suppurativa   Gastric reflux  Long Term Goal(s): Improvement in symptoms so as ready for discharge   Short Term Goals: Ability to maintain clinical measurements within normal limits will improve Ability to verbalize feelings will improve Ability to disclose and discuss suicidal ideas Ability to demonstrate self-control will improve     Medication Management: Evaluate patient's response, side effects, and tolerance of medication regimen.  Therapeutic Interventions: 1 to 1 sessions, Unit Group sessions and Medication administration.  Evaluation of Outcomes: Progressing   RN Treatment Plan for Primary Diagnosis:  Severe recurrent major depression without psychotic features (HCC) Long Term Goal(s): Knowledge of disease and therapeutic regimen to maintain health will improve  Short Term Goals: Ability to remain free from injury will improve, Ability to verbalize frustration and anger appropriately will improve, Ability to demonstrate self-control, Ability to participate in decision making will improve, Ability to verbalize feelings will improve, Ability to disclose and discuss suicidal ideas, Ability to identify and develop effective coping behaviors will improve, and Compliance with prescribed medications will improve  Medication Management: RN will administer medications as ordered by provider, will assess and evaluate patient's response and provide education  to patient for prescribed medication. RN will report any adverse and/or side effects to prescribing provider.  Therapeutic Interventions: 1 on 1 counseling sessions, Psychoeducation, Medication administration, Evaluate responses to treatment, Monitor vital signs and CBGs as ordered, Perform/monitor CIWA, COWS, AIMS and Fall Risk screenings as ordered, Perform wound care treatments as ordered.  Evaluation of Outcomes: Progressing   LCSW Treatment Plan for Primary Diagnosis: Severe recurrent major depression without psychotic features (HCC) Long Term Goal(s): Safe transition to appropriate next level of care at discharge, Engage patient in therapeutic group addressing interpersonal concerns.  Short Term Goals: Engage patient in aftercare planning with referrals and resources, Increase social support, Increase ability to appropriately verbalize feelings, Increase emotional regulation, Facilitate acceptance of mental health diagnosis and concerns, Facilitate patient progression through stages of change regarding substance use diagnoses and concerns, Identify triggers associated with mental health/substance abuse issues, and Increase skills for wellness and  recovery  Therapeutic Interventions: Assess for all discharge needs, 1 to 1 time with Social worker, Explore available resources and support systems, Assess for adequacy in community support network, Educate family and significant other(s) on suicide prevention, Complete Psychosocial Assessment, Interpersonal group therapy.  Evaluation of Outcomes: Progressing   Progress in Treatment: Attending groups: Yes. Participating in groups: Yes. Taking medication as prescribed: Yes. Toleration medication: Yes. Family/Significant other contact made: No, will contact:  when given permission.  Patient understands diagnosis: Yes. Discussing patient identified problems/goals with staff: Yes. Medical problems stabilized or resolved: Yes. Denies suicidal/homicidal ideation: Yes. Issues/concerns per patient self-inventory: No. Other: none.  New problem(s) identified: No, Describe:  none identified.  New Short Term/Long Term Goal(s): detox, medication management for mood stabilization; elimination of SI thoughts; development of comprehensive mental wellness/sobriety plan.  Patient Goals: "I would like to decrease my depression and get rid of my suicidal thoughts and get help with my anxiety and my overall well-being."   Discharge Plan or Barriers: CSW will assist pt with development of an appropriate aftercare/discharge plan.    Reason for Continuation of Hospitalization: Anxiety Depression Medication stabilization Suicidal ideation  Estimated Length of Stay: 1-7 days  Last 3 [place] Suicide Severity Risk Score: Flowsheet Row Admission (Current) from 03/11/2022 in [facility] INPATIENT BEHAVIORAL MEDICINE ED from 03/10/2022 in [facility] Admission (Discharged) from 10/28/2021 in [place] LONG-3 WEST ORTHOPEDICS  C-SSRS RISK CATEGORY Moderate Risk High Risk No Risk       Last PHQ 2/9 Scores:    12/05/2021    1:52 PM 11/14/2021    2:06 PM  Depression screen PHQ 2/9   Decreased Interest 1 0  Down, Depressed, Hopeless 1 0  PHQ - 2 Score 2 0    Scribe for Treatment Team: Glenis Smoker, LCSW 03/12/2022 10:05 AM

## 2022-03-12 NOTE — Transfer of Care (Signed)
Immediate Anesthesia Transfer of Care Note  Patient: Carolynn Serve  Procedure(s) Performed: ECT TX  Patient Location: PACU  Anesthesia Type:General  Level of Consciousness: drowsy  Airway & Oxygen Therapy: Patient Spontanous Breathing and Patient connected to face mask oxygen  Post-op Assessment: Report given to RN and Post -op Vital signs reviewed and stable  Post vital signs: Reviewed and stable  Last Vitals:  Vitals Value Taken Time  BP 154/96 03/12/22 1220  Temp 36.8 C 03/12/22 1219  Pulse 87 03/12/22 1223  Resp 15 03/12/22 1223  SpO2 99 % 03/12/22 1223  Vitals shown include unvalidated device data.  Last Pain:  Vitals:   03/12/22 1219  TempSrc:   PainSc: 0-No pain      Patients Stated Pain Goal: 0 (03/12/22 0837)  Complications: No notable events documented.

## 2022-03-12 NOTE — Progress Notes (Signed)
Patient at the nurses station. Reports 10/10 headache and nausea. Requesting medication to help with symptoms.  PRN medications,  Zofran for nausea and Imitrex for migraine headache, were given.  Will continue to monitor.  Reminded patient that she is NPO and would not be able to give any more medication.      C Butler-Nicholson, LPN

## 2022-03-12 NOTE — Progress Notes (Signed)
[facility] MD Progress Note  03/12/2022 2:16 PM Kayla Holden  MRN:  [id number] Subjective: Patient seen and chart reviewed.  41 year old woman with depression.  Continues to endorse depressed mood and suicidal ideation.  Patient had first ECT treatment today which was done without any complication or difficulty.  Patient was quite irritated that she was only given 5 mg of Ambien last night and could not sleep as well as she normally does Principal Problem: Severe recurrent major depression without psychotic features (HCC) Diagnosis: Principal Problem:   Severe recurrent major depression without psychotic features (HCC) Active Problems:   Chronic daily headache   Hidradenitis suppurativa   Gastric reflux  Total Time spent with patient: 30 minutes  Past Psychiatric History: Past history of recurrent depression nonresponse to multiple medicines and previous good response to ECT  Past Medical History:  Past Medical History:  Diagnosis Date   Anxiety    Anxiety    Bipolar 1 disorder (HCC)    Chiari malformation    Complication of anesthesia    Depression    GERD (gastroesophageal reflux disease)    MDD (major depressive disorder)    Microscopic colitis    Migraine headache    PONV (postoperative nausea and vomiting)     Past Surgical History:  Procedure Laterality Date   APPENDECTOMY     CHOLECYSTECTOMY     INCISION AND DRAINAGE OF WOUND Left 10/28/2021   Procedure: KNEE ARTHROSCOPY WITH IRRIGATION AND DEBRIDEMENT;  Surgeon: Eugenia Mcalpine, MD;  Location: [facility];  Service: Orthopedics;  Laterality: Left;  60 okay per OR   KNEE SURGERY Left    3 times   MANDIBLE SURGERY     Family History:  Family History  Problem Relation Age of Onset   Cancer Maternal Grandmother    Cancer Paternal Grandfather    Allergic rhinitis Neg Hx    Angioedema Neg Hx    Asthma Neg Hx    Atopy Neg Hx    Eczema Neg Hx    Urticaria Neg Hx    Immunodeficiency Neg Hx    Family Psychiatric  History:  See previous. Social History:  Social History   Substance and Sexual Activity  Alcohol Use No     Social History   Substance and Sexual Activity  Drug Use Yes   Types: Marijuana   Comment: has not smoked in last 3 days    Social History   Socioeconomic History   Marital status: Single    Spouse name: Not on file   Number of children: Not on file   Years of education: Not on file   Highest education level: Not on file  Occupational History   Not on file  Tobacco Use   Smoking status: Former    Types: Cigarettes   Smokeless tobacco: Never  Vaping Use   Vaping Use: Never used  Substance and Sexual Activity   Alcohol use: No   Drug use: Yes    Types: Marijuana    Comment: has not smoked in last 3 days   Sexual activity: Not on file  Other Topics Concern   Not on file  Social History Narrative   Not on file   Social Determinants of Health   Financial Resource Strain: Not on file  Food Insecurity: Not on file  Transportation Needs: Not on file  Physical Activity: Not on file  Stress: Not on file  Social Connections: Not on file   Additional Social History:  Sleep: Poor  Appetite:  Fair  Current Medications: Current Facility-Administered Medications  Medication Dose Route Frequency Provider Last Rate Last Admin   acetaminophen (TYLENOL) tablet 650 mg  650 mg Oral Q6H PRN Hiram Mciver,  T, MD   650 mg at 03/12/22 1304   alum & mag hydroxide-simeth (MAALOX/MYLANTA) 200-200-20 MG/5ML suspension 30 mL  30 mL Oral Q4H PRN Yerachmiel Spinney, Madie Reno, MD       cholecalciferol (VITAMIN D) tablet 1,000 Units  1,000 Units Oral Daily Demetri Goshert, Madie Reno, MD       dicyclomine (BENTYL) tablet 40 mg  40 mg Oral QID PRN Keshana Klemz, Madie Reno, MD       diphenoxylate-atropine (LOMOTIL) 2.5-0.025 MG per tablet 1 tablet  1 tablet Oral QID PRN Torrez Renfroe, Madie Reno, MD       dronabinol (MARINOL) capsule 5 mg  5 mg Oral BID Daune Colgate T, MD   5 mg at 03/12/22 [id number]    drospirenone-ethinyl estradiol (YAZ) 3-0.02 MG per tablet 1 tablet  1 tablet Oral Q lunch Joselynn Amoroso,  T, MD       fentaNYL (SUBLIMAZE) injection 25 mcg  25 mcg Intravenous Q5 min PRN [place] Service, Gijsbertus F, MD       LORazepam (ATIVAN) tablet 1 mg  1 mg Oral TID carlos Holtsclaw T, MD   1 mg at 03/11/22 1753   lurasidone (LATUDA) tablet 40 mg  40 mg Oral Q breakfast Andreya Lacks T, MD   40 mg at 03/11/22 1649   magnesium hydroxide (MILK OF MAGNESIA) suspension 30 mL  30 mL Oral Daily PRN Jazzmyn Filion, Madie Reno, MD       midazolam (VERSED) injection 2 mg  2 mg Intravenous Once Braxten Memmer T, MD       ondansetron [facility]) injection 4 mg  4 mg Intravenous Once PRN [place] Service, Gijsbertus F, MD       ondansetron (ZOFRAN-ODT) disintegrating tablet 8 mg  8 mg Oral Q8H PRN Hoyt Leanos T, MD   8 mg at 03/12/22 0232   pantoprazole (PROTONIX) EC tablet 80 mg  80 mg Oral BID Tonyia Marschall T, MD   80 mg at 03/12/22 0851   propranolol (INDERAL) tablet 10 mg  10 mg Oral TID Lorie Cleckley, Madie Reno, MD   10 mg at 03/12/22 0851   rosuvastatin (CRESTOR) tablet 10 mg  10 mg Oral QHS Yasuo Phimmasone T, MD   10 mg at 03/11/22 2132   spironolactone (ALDACTONE) tablet 100 mg  100 mg Oral Q1200 Kaiden Pech, Madie Reno, MD       SUMAtriptan (IMITREX) tablet 50 mg  50 mg Oral Daily PRN Amila Callies, Madie Reno, MD   50 mg at 03/12/22 [id number]   vitamin B-12 (CYANOCOBALAMIN) tablet 1,000 mcg  1,000 mcg Oral Daily , Madie Reno, MD       zolpidem (AMBIEN) tablet 10 mg  10 mg Oral QHS , Madie Reno, MD        Lab Results:  Results for orders placed or performed during the hospital encounter of 03/11/22 (from the past 48 hour(s))  Glucose, capillary     Status: Abnormal   Collection Time: 03/12/22  6:55 AM  Result Value Ref Range   Glucose-Capillary 101 (H) 70 - 99 mg/dL    Comment: Glucose reference range applies only to samples taken after fasting for at least 8 hours.    Blood Alcohol level:  Lab Results  Component Value Date   ETH <10  03/10/2022   ETH <10 11/28/2019  Metabolic Disorder Labs: Lab Results  Component Value Date   HGBA1C 5.9 (H) 03/10/2022   MPG 123 03/10/2022   Lab Results  Component Value Date   PROLACTIN 19.4 03/10/2022   Lab Results  Component Value Date   CHOL 233 (H) 03/10/2022   TRIG 183 (H) 03/10/2022   HDL 87 03/10/2022   CHOLHDL 2.7 03/10/2022   VLDL 37 03/10/2022   LDLCALC 109 (H) 03/10/2022    Physical Findings: AIMS:  , ,  ,  ,    CIWA:    COWS:     Musculoskeletal: Strength & Muscle Tone: within normal limits Gait & Station: normal Patient leans: N/A  Psychiatric Specialty Exam:  Presentation  General Appearance: Appropriate for Environment; Casual; Fairly Groomed  Eye Contact:Good  Speech:Clear and Coherent; Normal Rate  Speech Volume:Normal  Handedness:Right   Mood and Affect  Mood:Depressed; Anxious  Affect:Congruent   Thought Process  Thought Processes:Coherent; Goal Directed; Linear  Descriptions of Associations:Intact  Orientation:Full (Time, Place and Person)  Thought Content:Logical  History of Schizophrenia/Schizoaffective disorder:No  Duration of Psychotic Symptoms:No data recorded Hallucinations:Hallucinations: None  Ideas of Reference:None  Suicidal Thoughts:Suicidal Thoughts: Yes, Active SI Active Intent and/or Plan: With Plan  Homicidal Thoughts:Homicidal Thoughts: No   Sensorium  Memory:Immediate Good; Recent Good; Remote Good  Judgment:Fair  Insight:Fair   Executive Functions  Concentration:Good  Attention Span:Good  Recall:Good  Fund of Knowledge:Good  Language:Good   Psychomotor Activity  Psychomotor Activity:Psychomotor Activity: Normal   Assets  Assets:Communication Skills; Desire for Improvement; Financial Resources/Insurance; Housing; Resilience; Social Support   Sleep  Sleep:Sleep: Fair    Physical Exam: Physical Exam Vitals and nursing note reviewed.  Constitutional:      Appearance:  Normal appearance.  HENT:     Head: Normocephalic and atraumatic.     Mouth/Throat:     Pharynx: Oropharynx is clear.  Eyes:     Pupils: Pupils are equal, round, and reactive to light.  Cardiovascular:     Rate and Rhythm: Normal rate and regular rhythm.  Pulmonary:     Effort: Pulmonary effort is normal.     Breath sounds: Normal breath sounds.  Abdominal:     General: Abdomen is flat.     Palpations: Abdomen is soft.  Musculoskeletal:        General: Normal range of motion.  Skin:    General: Skin is warm and dry.  Neurological:     General: No focal deficit present.     Mental Status: She is alert. Mental status is at baseline.  Psychiatric:        Attention and Perception: Attention normal.        Mood and Affect: Mood is anxious and depressed.        Speech: Speech is tangential.        Behavior: Behavior is cooperative.        Thought Content: Thought content normal.        Cognition and Memory: Memory is impaired.        Judgment: Judgment is impulsive.    Review of Systems  Constitutional: Negative.   HENT: Negative.    Eyes: Negative.   Respiratory: Negative.    Cardiovascular: Negative.   Gastrointestinal: Negative.   Musculoskeletal: Negative.   Skin: Negative.   Neurological: Negative.   Psychiatric/Behavioral:  Positive for depression and suicidal ideas. Negative for hallucinations and substance abuse. The patient is nervous/anxious and has insomnia.    Blood pressure 117/77, pulse 82, temperature 98 F (  36.7 C), resp. rate 15, height 5\' 3"  (1.6 m), weight 64 kg, SpO2 97 %. Body mass index is 24.98 kg/m.   Treatment Plan Summary: Medication management and Plan continue medication management.  Continue ECT.  First treatment today was bilateral and was done without complication.  Patient woke up normally.  Ongoing evaluation of mood symptoms.  , MD 03/12/2022, 2:16 PM

## 2022-03-12 NOTE — Progress Notes (Signed)
Received QHS medication. Reports being prescribed 12.5mg  of Ambien and has concerns that this medication regimen is not going to work and she will not be able to get any sleep. Support and encouragement offered, to at least try to work with the medication as written tonight and to speak with the doctor tomorrow about her normal medication regimen. Denies hi/avh.  Endorses passive si, depression and anxiety. She is pleasant, cooperative and forth coming regarding her history and struggles with medication regulation. Will continue to monitor with q 15 minute safety checks. Patient has also contracted for safety.       C Butler-Nicholson, LPN

## 2022-03-13 DIAGNOSIS — F332 Major depressive disorder, recurrent severe without psychotic features: Secondary | ICD-10-CM | POA: Diagnosis not present

## 2022-03-13 MED ORDER — LURASIDONE HCL 40 MG PO TABS
40.0000 mg | ORAL_TABLET | Freq: Every day | ORAL | Status: DC
  Administered 2022-03-13 – 2022-03-17 (×5): 40 mg via ORAL
  Filled 2022-03-13 (×5): qty 1

## 2022-03-13 NOTE — Progress Notes (Signed)
Met with patient in the hall after she had completed snack.  Reported having jaw pain and wanted Tylenol. Rated jaw pain 6/10 at this encounter. Tylenol provided. Also gave QHS medications as she was ready for bed.  Reported depression is better. Continues to have passive si, but states that too is less than when she arrived. Contributes the ECT to be effective. Reports not sleeping well the night before and his hoping thein increase Ambien will help her have a good night sleep. Will continue to monitor and offer support and comfort.  Encouraged to reach out to staff with any needs she may have. Q 15 minute safety checks in place.     C Butler-Nicholson, LPN 

## 2022-03-13 NOTE — Group Note (Signed)
[facility] LCSW Group Therapy Note   Group Date: 03/13/2022 Start Time: 1300 End Time: 1400   Type of Therapy/Topic:  Group Therapy:  Balance in Life  Participation Level:  Did Not Attend   Description of Group:    This group will address the concept of balance and how it feels and looks when one is unbalanced. Patients will be encouraged to process areas in their lives that are out of balance, and identify reasons for remaining unbalanced. Facilitators will guide patients utilizing problem- solving interventions to address and correct the stressor making their life unbalanced. Understanding and applying boundaries will be explored and addressed for obtaining  and maintaining a balanced life. Patients will be encouraged to explore ways to assertively make their unbalanced needs known to significant others in their lives, using other group members and facilitator for support and feedback.  Therapeutic Goals: Patient will identify two or more emotions or situations they have that consume much of in their lives. Patient will identify signs/triggers that life has become out of balance:  Patient will identify two ways to set boundaries in order to achieve balance in their lives:  Patient will demonstrate ability to communicate their needs through discussion and/or role plays  Summary of Patient Progress:    Due to the acuity and complex discharge plans, group was not held. Patient was provided therapeutic worksheets and asked to meet with CSW as needed.    Therapeutic Modalities:   Cognitive Behavioral Therapy Solution-Focused Therapy Assertiveness Training   Japhet Morgenthaler J Shamela Haydon, LCSW 

## 2022-03-13 NOTE — Plan of Care (Signed)
  Problem: Education: Goal: Knowledge of General Education information will improve Description: Including pain rating scale, medication(s)/side effects and non-pharmacologic comfort measures Outcome: Progressing   Problem: Health Behavior/Discharge Planning: Goal: Ability to manage health-related needs will improve Outcome: Progressing   Problem: Clinical Measurements: Goal: Ability to maintain clinical measurements within normal limits will improve Outcome: Progressing Goal: Will remain free from infection Outcome: Progressing Goal: Diagnostic test results will improve Outcome: Progressing Goal: Respiratory complications will improve Outcome: Progressing Goal: Cardiovascular complication will be avoided Outcome: Progressing   Problem: Self-Concept: Goal: Ability to disclose and discuss suicidal ideas will improve Outcome: Progressing Goal: Will verbalize positive feelings about self Outcome: Progressing   Problem: Medication: Goal: Compliance with prescribed medication regimen will improve Outcome: Progressing   Problem: Health Behavior/Discharge Planning: Goal: Identification of resources available to assist in meeting health care needs will improve Outcome: Progressing   Problem: Coping: Goal: Coping ability will improve Outcome: Progressing   

## 2022-03-13 NOTE — Progress Notes (Signed)
Recreation Therapy Notes  INPATIENT RECREATION TR PLAN  Patient Details Name: Kayla Holden MRN: [id number] DOB: 05-Sep-1980 Today's Date: 03/13/2022  Rec Therapy Plan Is patient appropriate for Therapeutic Recreation?: Yes Treatment times per week: at least 3 Estimated Length of Stay: 5-7 days TR Treatment/Interventions: Group participation (Comment)  Discharge Criteria Pt will be discharged from therapy if:: Discharged Treatment plan/goals/alternatives discussed and agreed upon by:: Patient/family  Discharge Summary     Carollee Nussbaumer 03/13/2022, 12:59 PM

## 2022-03-13 NOTE — Progress Notes (Addendum)
Recreation Therapy Notes  Date: 03/13/2022   Time: 10:25 am      Location: Courtyard     Behavioral response: Appropriate   Intervention Topic: Wellness      Discussion/Intervention:  Group content today was focused on Wellness. The group defined wellness and some positive ways they make decisions for themselves. Individuals expressed reasons why they neglected any wellness in the past. Patients described ways to improve wellness skills in the future. The group explained what could happen if they did not do any wellness at all. Participants express how bad choices has affected them and others around them. Individual explained the importance of wellness. The group participated in the intervention "Testing my Wellness" where they had a chance to identify some of their weaknesses and strengths in wellness.  Clinical Observations/Feedback: Patient came to group and identified coloring as a wellness activity she participates in. Individual was social with peers and staff while participating in the intervention.    Lulabelle Desta LRT/CTRS           Jazlin Tapscott 03/13/2022 11:24 AM

## 2022-03-13 NOTE — Progress Notes (Signed)
[facility] ([facility]) MD Progress Note  03/13/2022 4:27 PM Kayla Holden  MRN:  [id number] Subjective: Follow-up patient with depression.  Patient continues to feel dysphoric.  Passive suicidal thoughts.  No psychosis.  No new physical complaints.  Does have some requests about modifying ECT because of jaw pain Principal Problem: Severe recurrent major depression without psychotic features (HCC) Diagnosis: Principal Problem:   Severe recurrent major depression without psychotic features (HCC) Active Problems:   Chronic daily headache   Hidradenitis suppurativa   Gastric reflux  Total Time spent with patient: 30 minutes  Past Psychiatric History: Past history of depression with response to ECT  Past Medical History:  Past Medical History:  Diagnosis Date   Anxiety    Anxiety    Bipolar 1 disorder (HCC)    Chiari malformation    Complication of anesthesia    Depression    GERD (gastroesophageal reflux disease)    MDD (major depressive disorder)    Microscopic colitis    Migraine headache    PONV (postoperative nausea and vomiting)     Past Surgical History:  Procedure Laterality Date   APPENDECTOMY     CHOLECYSTECTOMY     INCISION AND DRAINAGE OF WOUND Left 10/28/2021   Procedure: KNEE ARTHROSCOPY WITH IRRIGATION AND DEBRIDEMENT;  Surgeon: Eugenia Mcalpine, MD;  Location: [facility];  Service: Orthopedics;  Laterality: Left;  60 okay per OR   KNEE SURGERY Left    3 times   MANDIBLE SURGERY     Family History:  Family History  Problem Relation Age of Onset   Cancer Maternal Grandmother    Cancer Paternal Grandfather    Allergic rhinitis Neg Hx    Angioedema Neg Hx    Asthma Neg Hx    Atopy Neg Hx    Eczema Neg Hx    Urticaria Neg Hx    Immunodeficiency Neg Hx    Family Psychiatric  History: See previous Social History:  Social History   Substance and Sexual Activity  Alcohol Use No     Social History   Substance and Sexual Activity  Drug Use Yes   Types: Marijuana    Comment: has not smoked in last 3 days    Social History   Socioeconomic History   Marital status: Single    Spouse name: Not on file   Number of children: Not on file   Years of education: Not on file   Highest education level: Not on file  Occupational History   Not on file  Tobacco Use   Smoking status: Former    Types: Cigarettes   Smokeless tobacco: Never  Vaping Use   Vaping Use: Never used  Substance and Sexual Activity   Alcohol use: No   Drug use: Yes    Types: Marijuana    Comment: has not smoked in last 3 days   Sexual activity: Not on file  Other Topics Concern   Not on file  Social History Narrative   Not on file   Social Determinants of Health   Financial Resource Strain: Not on file  Food Insecurity: Not on file  Transportation Needs: Not on file  Physical Activity: Not on file  Stress: Not on file  Social Connections: Not on file   Additional Social History:                         Sleep: Fair  Appetite:  Negative  Current Medications: Current Facility-Administered Medications  Medication  Dose Route Frequency Provider Last Rate Last Admin   acetaminophen (TYLENOL) tablet 650 mg  650 mg Oral Q6H PRN Raynie Steinhaus, Jackquline Denmark, MD   650 mg at 03/13/22 0814   alum & mag hydroxide-simeth (MAALOX/MYLANTA) 200-200-20 MG/5ML suspension 30 mL  30 mL Oral Q4H PRN Caycee Wanat, Jackquline Denmark, MD       cholecalciferol (VITAMIN D) tablet 1,000 Units  1,000 Units Oral Daily Luanna Weesner, Jackquline Denmark, MD   1,000 Units at 03/13/22 2334   dicyclomine (BENTYL) tablet 40 mg  40 mg Oral QID PRN Miasha Emmons, Jackquline Denmark, MD       diphenoxylate-atropine (LOMOTIL) 2.5-0.025 MG per tablet 1 tablet  1 tablet Oral QID PRN Samanvi Cuccia, Jackquline Denmark, MD       dronabinol (MARINOL) capsule 5 mg  5 mg Oral BID Ketan Renz T, MD   5 mg at 03/13/22 3568   drospirenone-ethinyl estradiol (YAZ) 3-0.02 MG per tablet 1 tablet  1 tablet Oral Q lunch Aspen Lawrance, Jackquline Denmark, MD   1 tablet at 03/13/22 1249   fentaNYL (SUBLIMAZE)  injection 25 mcg  25 mcg Intravenous Q5 min PRN Darleene Cleaver, Gerrit Heck, MD       LORazepam (ATIVAN) tablet 1 mg  1 mg Oral TID Tramane Gorum T, MD   1 mg at 03/13/22 1202   lurasidone (LATUDA) tablet 40 mg  40 mg Oral Q supper Landy Mace T, MD       magnesium hydroxide (MILK OF MAGNESIA) suspension 30 mL  30 mL Oral Daily PRN Alyanna Stoermer T, MD       midazolam (VERSED) injection 2 mg  2 mg Intravenous Once Nedim Oki T, MD       ondansetron [facility]) injection 4 mg  4 mg Intravenous Once PRN Darleene Cleaver, Gijsbertus F, MD       ondansetron (ZOFRAN-ODT) disintegrating tablet 8 mg  8 mg Oral Q8H PRN Shaheen Mende T, MD   8 mg at 03/13/22 0522   pantoprazole (PROTONIX) EC tablet 80 mg  80 mg Oral BID Tristan Proto T, MD   80 mg at 03/13/22 0814   propranolol (INDERAL) tablet 10 mg  10 mg Oral TID Alania Overholt T, MD   10 mg at 03/13/22 1202   rosuvastatin (CRESTOR) tablet 10 mg  10 mg Oral QHS Resean Brander T, MD   10 mg at 03/12/22 2054   spironolactone (ALDACTONE) tablet 100 mg  100 mg Oral Q1200 Emmi Wertheim T, MD   100 mg at 03/13/22 1202   SUMAtriptan (IMITREX) tablet 50 mg  50 mg Oral Daily PRN Sargent Mankey, Jackquline Denmark, MD   50 mg at 03/13/22 0522   vitamin B-12 (CYANOCOBALAMIN) tablet 1,000 mcg  1,000 mcg Oral Daily Shivaun Bilello T, MD   1,000 mcg at 03/13/22 0815   zolpidem (AMBIEN) tablet 10 mg  10 mg Oral QHS Idonia Zollinger, Jackquline Denmark, MD   10 mg at 03/12/22 2053    Lab Results:  Results for orders placed or performed during the hospital encounter of 03/11/22 (from the past 48 hour(s))  Glucose, capillary     Status: Abnormal   Collection Time: 03/12/22  6:55 AM  Result Value Ref Range   Glucose-Capillary 101 (H) 70 - 99 mg/dL    Comment: Glucose reference range applies only to samples taken after fasting for at least 8 hours.    Blood Alcohol level:  Lab Results  Component Value Date   ETH <10 03/10/2022   ETH <10 11/28/2019  Metabolic Disorder Labs: Lab Results  Component Value  Date   HGBA1C 5.9 (H) 03/10/2022   MPG 123 03/10/2022   Lab Results  Component Value Date   PROLACTIN 19.4 03/10/2022   Lab Results  Component Value Date   CHOL 233 (H) 03/10/2022   TRIG 183 (H) 03/10/2022   HDL 87 03/10/2022   CHOLHDL 2.7 03/10/2022   VLDL 37 03/10/2022   LDLCALC 109 (H) 03/10/2022    Physical Findings: AIMS:  , ,  ,  ,    CIWA:    COWS:     Musculoskeletal: Strength & Muscle Tone: within normal limits Gait & Station: normal Patient leans: N/A  Psychiatric Specialty Exam:  Presentation  General Appearance: Appropriate for Environment; Casual; Fairly Groomed  Eye Contact:Good  Speech:Clear and Coherent; Normal Rate  Speech Volume:Normal  Handedness:Right   Mood and Affect  Mood:Depressed; Anxious  Affect:Congruent   Thought Process  Thought Processes:Coherent; Goal Directed; Linear  Descriptions of Associations:Intact  Orientation:Full (Time, Place and Person)  Thought Content:Logical  History of Schizophrenia/Schizoaffective disorder:No  Duration of Psychotic Symptoms:No data recorded Hallucinations:No data recorded Ideas of Reference:None  Suicidal Thoughts:No data recorded Homicidal Thoughts:No data recorded  Sensorium  Memory:Immediate Good; Recent Good; Remote Good  Judgment:Fair  Insight:Fair   Executive Functions  Concentration:Good  Attention Span:Good  Recall:Good  Fund of Knowledge:Good  Language:Good   Psychomotor Activity  Psychomotor Activity:No data recorded  Assets  Assets:Communication Skills; Desire for Improvement; Financial Resources/Insurance; Housing; Resilience; Social Support   Sleep  Sleep:No data recorded   Physical Exam: Physical Exam Vitals and nursing note reviewed.  Constitutional:      Appearance: Normal appearance.  HENT:     Head: Normocephalic and atraumatic.     Mouth/Throat:     Pharynx: Oropharynx is clear.  Eyes:     Pupils: Pupils are equal, round, and  reactive to light.  Cardiovascular:     Rate and Rhythm: Normal rate and regular rhythm.  Pulmonary:     Effort: Pulmonary effort is normal.     Breath sounds: Normal breath sounds.  Abdominal:     General: Abdomen is flat.     Palpations: Abdomen is soft.  Musculoskeletal:        General: Normal range of motion.  Skin:    General: Skin is warm and dry.  Neurological:     General: No focal deficit present.     Mental Status: She is alert. Mental status is at baseline.  Psychiatric:        Attention and Perception: Attention normal.        Mood and Affect: Mood is depressed.        Speech: Speech normal.        Behavior: Behavior normal.        Thought Content: Thought content normal.        Cognition and Memory: Cognition normal.        Judgment: Judgment normal.    Review of Systems  Constitutional: Negative.   HENT: Negative.    Eyes: Negative.   Respiratory: Negative.    Cardiovascular: Negative.   Gastrointestinal: Negative.   Musculoskeletal: Negative.   Skin: Negative.   Neurological: Negative.   Psychiatric/Behavioral:  Positive for depression.    Blood pressure 116/84, pulse 85, temperature 98.5 F (36.9 C), temperature source Oral, resp. rate 18, height 5\' 3"  (1.6 m), weight 64 kg, SpO2 100 %. Body mass index is 24.98 kg/m.   Treatment Plan Summary: Plan continue  treatment plan with medication.  ECT canceled for tomorrow due to nursing being out.  Mordecai Rasmussen, MD 03/13/2022, 4:27 PM

## 2022-03-13 NOTE — Progress Notes (Signed)
Pt seen visible in the milieu most of the morning. She reports chronic jaw pain that felt exacerbated during her ECT yesterday. She continues to report intermittent SI with plan to OD once discharge. Anxiety was high in the AM after a peer was yelling in the dining room. [place] rescheduled for evening. She reports disrupted sleep last night. She is wanting the full dose (12.5mg ) of ambien but she states it has not been effectively treating her insomnia. Anxiety 8/10 and depression 9/10. Denies HI, and AVH.

## 2022-03-13 NOTE — Progress Notes (Signed)
Recreation Therapy Notes  INPATIENT RECREATION THERAPY ASSESSMENT  Patient Details Name: Kayla Holden MRN: [id number] DOB: February 21, 1981 Today's Date: 03/13/2022       Information Obtained From: Patient  Able to Participate in Assessment/Interview: Yes  Patient Presentation: Responsive  Reason for Admission (Per Patient): Active Symptoms  Patient Stressors:    Coping Skills:   Other (Comment) (drive)  Leisure Interests (2+):  Social - Friends, Art - Mudlogger (Eating out)  Frequency of Recreation/Participation: Monthly  Awareness of Community Resources:  No  Community Resources:     Current Use: No  If no, Barriers?:    Expressed Interest in [employer] Information: Yes  [employer] of Residence:  Arts administrator  Patient Main Form of Transportation: Set designer  Patient Strengths:  Tolerance  Patient Identified Areas of Improvement:  My patience  Patient Goal for Hospitalization:  Decrease depression and anxiety and improve my well being  Current SI (including self-harm):  Yes (No plan)  Current HI:  No  Current AVH: No  Staff Intervention Plan: Group Attendance, Collaborate with Interdisciplinary Treatment Team  Consent to Intern Participation: N/A  Corri Delapaz 03/13/2022, 12:59 PM

## 2022-03-14 DIAGNOSIS — F332 Major depressive disorder, recurrent severe without psychotic features: Secondary | ICD-10-CM | POA: Diagnosis not present

## 2022-03-14 LAB — GLUCOSE, CAPILLARY: Glucose-Capillary: 111 mg/dL — ABNORMAL HIGH (ref 70–99)

## 2022-03-14 NOTE — Group Note (Signed)
LCSW Group Therapy Note  Group Date: 03/14/2022 Start Time: 1430 End Time: 1530   Type of Therapy and Topic:  Group Therapy: Using "I" Statements  Participation Level:  Did Not Attend  Description of Group:  Patients were asked to provide details of some interpersonal conflicts they have experienced. Patients were then educated about "I" statements, communication which focuses on feelings or views of the speaker rather than what the other person is doing. T group members were asked to reflect on past conflicts and to provide specific examples for utilizing "I" statements.  Therapeutic Goals:  Patients will verbalize understanding of ineffective communication and effective communication. Patients will be able to empathize with whom they are having conflict. Patients will practice effective communication in the form of "I" statements.    Summary of Patient Progress:   Due to the acuity and complex discharge plans, group was not held. Patient was provided therapeutic worksheets and asked to meet with CSW as needed.  Therapeutic Modalities:   Cognitive Behavioral Therapy Solution-Focused Therapy    Harden Mo, LCSW 03/14/2022  3:41 PM

## 2022-03-14 NOTE — Plan of Care (Signed)
D: Pt alert and oriented. Pt rates depression 8/10, hopelessness 8/10, and anxiety 8/10. Pt goal:"ECT." Pt reports energy level as low and concentration as being poor. Pt reports sleep last night as being fair. Pt did receive medications for sleep and did find them helpful. Pt reports experiencing 6/10 chronic back pain and 8/10 headache pain at this time, prn meds given. Pt denies experiencing any SI/HI, or AVH at this time.   A: Scheduled medications administered to pt, per MD orders. Support and encouragement provided. Frequent verbal contact made. Routine safety checks conducted q15 minutes.   R: No adverse drug reactions noted. Pt verbally contracts for safety at this time. Pt compliant with medications and treatment plan. Pt interacts well with others on the unit. Pt remains safe at this time. Will continue to monitor.   Problem: Activity: Goal: Risk for activity intolerance will decrease Outcome: Progressing   Problem: Coping: Goal: Level of anxiety will decrease Outcome: Not Progressing

## 2022-03-14 NOTE — Progress Notes (Signed)
[facility] MD Progress Note  03/14/2022 1:50 PM Kayla Holden  MRN:  [id number] Subjective: Patient seen and chart reviewed.  Continues to have depression passive suicidal ideation without active intent or plan.  No physical complaints Principal Problem: Severe recurrent major depression without psychotic features (HCC) Diagnosis: Principal Problem:   Severe recurrent major depression without psychotic features (HCC) Active Problems:   Chronic daily headache   Hidradenitis suppurativa   Gastric reflux  Total Time spent with patient: 20 minutes  Past Psychiatric History: Past history of depression with suicidal ideation and good response to ECT  Past Medical History:  Past Medical History:  Diagnosis Date   Anxiety    Anxiety    Bipolar 1 disorder (HCC)    Chiari malformation    Complication of anesthesia    Depression    GERD (gastroesophageal reflux disease)    MDD (major depressive disorder)    Microscopic colitis    Migraine headache    PONV (postoperative nausea and vomiting)     Past Surgical History:  Procedure Laterality Date   APPENDECTOMY     CHOLECYSTECTOMY     INCISION AND DRAINAGE OF WOUND Left 10/28/2021   Procedure: KNEE ARTHROSCOPY WITH IRRIGATION AND DEBRIDEMENT;  Surgeon: Eugenia Mcalpine, MD;  Location: [facility];  Service: Orthopedics;  Laterality: Left;  60 okay per OR   KNEE SURGERY Left    3 times   MANDIBLE SURGERY     Family History:  Family History  Problem Relation Age of Onset   Cancer Maternal Grandmother    Cancer Paternal Grandfather    Allergic rhinitis Neg Hx    Angioedema Neg Hx    Asthma Neg Hx    Atopy Neg Hx    Eczema Neg Hx    Urticaria Neg Hx    Immunodeficiency Neg Hx    Family Psychiatric  History: See previous Social History:  Social History   Substance and Sexual Activity  Alcohol Use No     Social History   Substance and Sexual Activity  Drug Use Yes   Types: Marijuana   Comment: has not smoked in last 3 days     Social History   Socioeconomic History   Marital status: Single    Spouse name: Not on file   Number of children: Not on file   Years of education: Not on file   Highest education level: Not on file  Occupational History   Not on file  Tobacco Use   Smoking status: Former    Types: Cigarettes   Smokeless tobacco: Never  Vaping Use   Vaping Use: Never used  Substance and Sexual Activity   Alcohol use: No   Drug use: Yes    Types: Marijuana    Comment: has not smoked in last 3 days   Sexual activity: Not on file  Other Topics Concern   Not on file  Social History Narrative   Not on file   Social Determinants of Health   Financial Resource Strain: Not on file  Food Insecurity: Not on file  Transportation Needs: Not on file  Physical Activity: Not on file  Stress: Not on file  Social Connections: Not on file   Additional Social History:                         Sleep: Fair  Appetite:  Fair  Current Medications: Current Facility-Administered Medications  Medication Dose Route Frequency Provider Last Rate Last Admin  acetaminophen (TYLENOL) tablet 650 mg  650 mg Oral Q6H PRN Srija Southard, Jackquline Denmark, MD   650 mg at 03/13/22 0814   alum & mag hydroxide-simeth (MAALOX/MYLANTA) 200-200-20 MG/5ML suspension 30 mL  30 mL Oral Q4H PRN Jacere Pangborn, Jackquline Denmark, MD       cholecalciferol (VITAMIN D) tablet 1,000 Units  1,000 Units Oral Daily Kenora Spayd, Jackquline Denmark, MD   1,000 Units at 03/14/22 0858   dicyclomine (BENTYL) tablet 40 mg  40 mg Oral QID PRN Amita Atayde, Jackquline Denmark, MD   40 mg at 03/13/22 1815   diphenoxylate-atropine (LOMOTIL) 2.5-0.025 MG per tablet 1 tablet  1 tablet Oral QID PRN Jasmane Brockway, Jackquline Denmark, MD       dronabinol (MARINOL) capsule 5 mg  5 mg Oral BID Jolon Degante, Jackquline Denmark, MD   5 mg at 03/14/22 0744   drospirenone-ethinyl estradiol (YAZ) 3-0.02 MG per tablet 1 tablet  1 tablet Oral Q lunch Nashaun Hillmer T, MD   1 tablet at 03/14/22 1242   fentaNYL (SUBLIMAZE) injection 25 mcg  25 mcg  Intravenous Q5 min PRN Darleene Cleaver, Gijsbertus F, MD       LORazepam (ATIVAN) tablet 1 mg  1 mg Oral TID Sreshta Cressler T, MD   1 mg at 03/14/22 1209   lurasidone (LATUDA) tablet 40 mg  40 mg Oral Q supper Courtland Coppa T, MD   40 mg at 03/13/22 1730   magnesium hydroxide (MILK OF MAGNESIA) suspension 30 mL  30 mL Oral Daily PRN Ameira Alessandrini, Jackquline Denmark, MD       midazolam (VERSED) injection 2 mg  2 mg Intravenous Once Rachel Rison T, MD       ondansetron [facility]) injection 4 mg  4 mg Intravenous Once PRN Darleene Cleaver, Gijsbertus F, MD       ondansetron (ZOFRAN-ODT) disintegrating tablet 8 mg  8 mg Oral Q8H PRN Shoshannah Faubert T, MD   8 mg at 03/13/22 0522   pantoprazole (PROTONIX) EC tablet 80 mg  80 mg Oral BID Marquesa Rath T, MD   80 mg at 03/14/22 0744   propranolol (INDERAL) tablet 10 mg  10 mg Oral TID Alantra Popoca T, MD   10 mg at 03/14/22 1209   rosuvastatin (CRESTOR) tablet 10 mg  10 mg Oral QHS Aeva Posey T, MD   10 mg at 03/13/22 2140   spironolactone (ALDACTONE) tablet 100 mg  100 mg Oral Q1200 Chawn Spraggins T, MD   100 mg at 03/14/22 1210   SUMAtriptan (IMITREX) tablet 50 mg  50 mg Oral Daily PRN Rateel Beldin, Jackquline Denmark, MD   50 mg at 03/13/22 0522   vitamin B-12 (CYANOCOBALAMIN) tablet 1,000 mcg  1,000 mcg Oral Daily Candace Begue, Jackquline Denmark, MD   1,000 mcg at 03/14/22 0858   zolpidem (AMBIEN) tablet 10 mg  10 mg Oral QHS Owen Pratte, Jackquline Denmark, MD   10 mg at 03/13/22 2140    Lab Results:  Results for orders placed or performed during the hospital encounter of 03/11/22 (from the past 48 hour(s))  Glucose, capillary     Status: Abnormal   Collection Time: 03/14/22  5:47 AM  Result Value Ref Range   Glucose-Capillary 111 (H) 70 - 99 mg/dL    Comment: Glucose reference range applies only to samples taken after fasting for at least 8 hours.    Blood Alcohol level:  Lab Results  Component Value Date   ETH <10 03/10/2022   ETH <10 11/28/2019    Metabolic Disorder Labs: Lab  Results  Component Value Date    HGBA1C 5.9 (H) 03/10/2022   MPG 123 03/10/2022   Lab Results  Component Value Date   PROLACTIN 19.4 03/10/2022   Lab Results  Component Value Date   CHOL 233 (H) 03/10/2022   TRIG 183 (H) 03/10/2022   HDL 87 03/10/2022   CHOLHDL 2.7 03/10/2022   VLDL 37 03/10/2022   LDLCALC 109 (H) 03/10/2022    Physical Findings: AIMS:  , ,  ,  ,    CIWA:    COWS:     Musculoskeletal: Strength & Muscle Tone: within normal limits Gait & Station: normal Patient leans: N/A  Psychiatric Specialty Exam:  Presentation  General Appearance: Appropriate for Environment; Casual; Fairly Groomed  Eye Contact:Good  Speech:Clear and Coherent; Normal Rate  Speech Volume:Normal  Handedness:Right   Mood and Affect  Mood:Depressed; Anxious  Affect:Congruent   Thought Process  Thought Processes:Coherent; Goal Directed; Linear  Descriptions of Associations:Intact  Orientation:Full (Time, Place and Person)  Thought Content:Logical  History of Schizophrenia/Schizoaffective disorder:No  Duration of Psychotic Symptoms:No data recorded Hallucinations:No data recorded Ideas of Reference:None  Suicidal Thoughts:No data recorded Homicidal Thoughts:No data recorded  Sensorium  Memory:Immediate Good; Recent Good; Remote Good  Judgment:Fair  Insight:Fair   Executive Functions  Concentration:Good  Attention Span:Good  Recall:Good  Fund of Knowledge:Good  Language:Good   Psychomotor Activity  Psychomotor Activity:No data recorded  Assets  Assets:Communication Skills; Desire for Improvement; Financial Resources/Insurance; Housing; Resilience; Social Support   Sleep  Sleep:No data recorded   Physical Exam: Physical Exam Vitals and nursing note reviewed.  Constitutional:      Appearance: Normal appearance.  HENT:     Head: Normocephalic and atraumatic.     Mouth/Throat:     Pharynx: Oropharynx is clear.  Eyes:     Pupils: Pupils are equal, round, and reactive  to light.  Cardiovascular:     Rate and Rhythm: Normal rate and regular rhythm.  Pulmonary:     Effort: Pulmonary effort is normal.     Breath sounds: Normal breath sounds.  Abdominal:     General: Abdomen is flat.     Palpations: Abdomen is soft.  Musculoskeletal:        General: Normal range of motion.  Skin:    General: Skin is warm and dry.  Neurological:     General: No focal deficit present.     Mental Status: She is alert. Mental status is at baseline.  Psychiatric:        Attention and Perception: Attention normal.        Mood and Affect: Mood is depressed.        Speech: Speech normal.        Behavior: Behavior is cooperative.        Thought Content: Thought content normal.        Cognition and Memory: Cognition normal.    Review of Systems  Constitutional: Negative.   HENT: Negative.    Eyes: Negative.   Respiratory: Negative.    Cardiovascular: Negative.   Gastrointestinal: Negative.   Musculoskeletal: Negative.   Skin: Negative.   Neurological: Negative.   Psychiatric/Behavioral:  Positive for depression and suicidal ideas. Negative for hallucinations.    Blood pressure (!) 131/95, pulse 84, temperature 98.3 F (36.8 C), temperature source Oral, resp. rate 18, height 5\' 3"  (1.6 m), weight 64 kg, SpO2 100 %. Body mass index is 24.98 kg/m.   Treatment Plan Summary: Plan continue ECT on Monday no change to  medicine today  Mordecai Rasmussen, MD 03/14/2022, 1:50 PM

## 2022-03-14 NOTE — Progress Notes (Signed)
Recreation Therapy Notes    Date: 03/14/2022   Time: 10:50 am     Location: Court yard    Behavioral response: N/A   Intervention Topic: Social Skills     Discussion/Intervention: Patient refused to attend group.    Clinical Observations/Feedback:  Patient refused to attend group.    Inger Wiest LRT/CTRS        Giada Schoppe 03/14/2022 11:30 AM

## 2022-03-14 NOTE — Progress Notes (Signed)
Patient is pleasant and cooperative. She is med complaint with no issues. Verbalized understanding r/t NPO after midnight for upcoming ECT tomorrow. She has been active on the unit and gets along well with the other  patients on the unit. She continues to endorse passive si with plan to OD on her BP medications. Reports getting her medications delivered with 90 day supply and that is how she will be able to follow through with her plan. She denies hi/avh. Continues to endorse anxiety and depression rating both 9/10 a this encounter.   Upset that Ambien medication has not been upped to the dosage that she normally takes. Has concerns that she will not be able to sleep tonight.  Will continue to monitor.  Offered support and encouragement.  Q 15 minute safety plans in place.    C Butler-Nicholson, LPN

## 2022-03-14 NOTE — Plan of Care (Signed)
  Problem: Education: Goal: Knowledge of General Education information will improve Description: Including pain rating scale, medication(s)/side effects and non-pharmacologic comfort measures Outcome: Progressing   Problem: Health Behavior/Discharge Planning: Goal: Ability to manage health-related needs will improve Outcome: Progressing   Problem: Clinical Measurements: Goal: Ability to maintain clinical measurements within normal limits will improve Outcome: Progressing Goal: Will remain free from infection Outcome: Progressing Goal: Diagnostic test results will improve Outcome: Progressing Goal: Respiratory complications will improve Outcome: Progressing Goal: Cardiovascular complication will be avoided Outcome: Progressing   Problem: Self-Concept: Goal: Ability to disclose and discuss suicidal ideas will improve Outcome: Progressing Goal: Will verbalize positive feelings about self Outcome: Progressing   Problem: Medication: Goal: Compliance with prescribed medication regimen will improve Outcome: Progressing   Problem: Health Behavior/Discharge Planning: Goal: Identification of resources available to assist in meeting health care needs will improve Outcome: Progressing   Problem: Coping: Goal: Coping ability will improve Outcome: Progressing   Problem: Education: Goal: Ability to make informed decisions regarding treatment will improve Outcome: Progressing   Problem: Safety: Goal: Periods of time without injury will increase Outcome: Progressing   

## 2022-03-15 DIAGNOSIS — F332 Major depressive disorder, recurrent severe without psychotic features: Secondary | ICD-10-CM | POA: Diagnosis not present

## 2022-03-15 NOTE — Progress Notes (Signed)
Pt took a shower. Shalissa Easterwood RN 

## 2022-03-15 NOTE — Group Note (Signed)
LCSW Group Therapy Note  Group Date: 03/15/2022 Start Time: 1500 End Time: 1510   Type of Therapy and Topic:  Group Therapy - How To Cope with Nervousness about Discharge   Participation Level:  Did Not Attend   Description of Group This process group involved identification of patients' feelings about discharge. Some of them are scheduled to be discharged soon, while others are new admissions, but each of them was asked to share thoughts and feelings surrounding discharge from the hospital. One common theme was that they are excited at the prospect of going home, while another was that many of them are apprehensive about sharing why they were hospitalized. Patients were given the opportunity to discuss these feelings with their peers in preparation for discharge.  Therapeutic Goals  Patient will identify their overall feelings about pending discharge. Patient will think about how they might proactively address issues that they believe will once again arise once they get home (i.e. with parents). Patients will participate in discussion about having hope for change.   Summary of Patient Progress:   X    Therapeutic Modalities Cognitive Behavioral Therapy   Larry Alcock A [place], LCSWA 03/15/2022  3:34 PM

## 2022-03-15 NOTE — Progress Notes (Signed)
Patient compliant with medications endorses Passive SI denies HI/A/VH and verbally contracted for safety. C/O anxiety 7/10 and depression 8/10  Support and encouragement provided.

## 2022-03-15 NOTE — Progress Notes (Signed)
Pt has been calm, cooperative and med compliant. Pt went to group. Pt has a good appetite. Affect is anxious but pleasant at times. Torrie Mayers RN

## 2022-03-15 NOTE — Progress Notes (Signed)
Pt wasn't to be discharged today. She states that she is not getting any help here and she wants to receive ECT outpatient. Torrie Mayers RN

## 2022-03-15 NOTE — Plan of Care (Addendum)
Pt rates depression, hopelessness and anxiety all 8/10. Pt denies HI and AVH. Pt was educated on care plan and verbalizes understanding. Torrie Mayers RN Problem: Education: Goal: Knowledge of General Education information will improve Description: Including pain rating scale, medication(s)/side effects and non-pharmacologic comfort measures Outcome: Progressing   Problem: Health Behavior/Discharge Planning: Goal: Ability to manage health-related needs will improve Outcome: Progressing   Problem: Clinical Measurements: Goal: Ability to maintain clinical measurements within normal limits will improve Outcome: Progressing Goal: Will remain free from infection Outcome: Progressing Goal: Diagnostic test results will improve Outcome: Progressing Goal: Respiratory complications will improve Outcome: Progressing Goal: Cardiovascular complication will be avoided Outcome: Progressing   Problem: Activity: Goal: Risk for activity intolerance will decrease Outcome: Progressing   Problem: Nutrition: Goal: Adequate nutrition will be maintained Outcome: Progressing   Problem: Coping: Goal: Level of anxiety will decrease Outcome: Progressing   Problem: Elimination: Goal: Will not experience complications related to bowel motility Outcome: Progressing Goal: Will not experience complications related to urinary retention Outcome: Progressing   Problem: Pain Managment: Goal: General experience of comfort will improve Outcome: Progressing   Problem: Safety: Goal: Ability to remain free from injury will improve Outcome: Progressing   Problem: Skin Integrity: Goal: Risk for impaired skin integrity will decrease Outcome: Progressing   Problem: Education: Goal: Knowledge of [place] [facility] Education information/materials will improve Outcome: Progressing Goal: Emotional status will improve Outcome: Progressing Goal: Mental status will improve Outcome: Progressing Goal:  Verbalization of understanding the information provided will improve Outcome: Progressing   Problem: Activity: Goal: Interest or engagement in activities will improve Outcome: Progressing Goal: Sleeping patterns will improve Outcome: Progressing   Problem: Activity: Goal: Sleeping patterns will improve Outcome: Progressing   Problem: Coping: Goal: Ability to verbalize frustrations and anger appropriately will improve Outcome: Progressing Goal: Ability to demonstrate self-control will improve Outcome: Progressing   Problem: Health Behavior/Discharge Planning: Goal: Identification of resources available to assist in meeting health care needs will improve Outcome: Progressing Goal: Compliance with treatment plan for underlying cause of condition will improve Outcome: Progressing   Problem: Physical Regulation: Goal: Ability to maintain clinical measurements within normal limits will improve Outcome: Progressing   Problem: Safety: Goal: Periods of time without injury will increase Outcome: Progressing   Problem: Education: Goal: Ability to make informed decisions regarding treatment will improve Outcome: Progressing   Problem: Coping: Goal: Coping ability will improve Outcome: Progressing   Problem: Health Behavior/Discharge Planning: Goal: Identification of resources available to assist in meeting health care needs will improve Outcome: Progressing   Problem: Medication: Goal: Compliance with prescribed medication regimen will improve Outcome: Progressing   Problem: Self-Concept: Goal: Ability to disclose and discuss suicidal ideas will improve Outcome: Progressing Goal: Will verbalize positive feelings about self Outcome: Progressing

## 2022-03-15 NOTE — Progress Notes (Signed)
[facility] MD Progress Note  03/15/2022 12:24 PM Kayla Holden  MRN:  [id number] Subjective: Patient is seen on rounds.  She complains that there has not been any group therapy.  She is looking forward to ECT.  At the same time, she would like to leave.  I told her that she needs to talk with Dr. Toni Amend about her discharge plan and ECT.  Principal Problem: Severe recurrent major depression without psychotic features (HCC) Diagnosis: Principal Problem:   Severe recurrent major depression without psychotic features (HCC) Active Problems:   Chronic daily headache   Hidradenitis suppurativa   Gastric reflux  Total Time spent with patient: 15 minutes  Past Psychiatric History:  Past history of depression with suicidal ideation and good response to ECT  Past Medical History:  Past Medical History:  Diagnosis Date   Anxiety    Anxiety    Bipolar 1 disorder (HCC)    Chiari malformation    Complication of anesthesia    Depression    GERD (gastroesophageal reflux disease)    MDD (major depressive disorder)    Microscopic colitis    Migraine headache    PONV (postoperative nausea and vomiting)     Past Surgical History:  Procedure Laterality Date   APPENDECTOMY     CHOLECYSTECTOMY     INCISION AND DRAINAGE OF WOUND Left 10/28/2021   Procedure: KNEE ARTHROSCOPY WITH IRRIGATION AND DEBRIDEMENT;  Surgeon: Eugenia Mcalpine, MD;  Location: [facility];  Service: Orthopedics;  Laterality: Left;  60 okay per OR   KNEE SURGERY Left    3 times   MANDIBLE SURGERY     Family History:  Family History  Problem Relation Age of Onset   Cancer Maternal Grandmother    Cancer Paternal Grandfather    Allergic rhinitis Neg Hx    Angioedema Neg Hx    Asthma Neg Hx    Atopy Neg Hx    Eczema Neg Hx    Urticaria Neg Hx    Immunodeficiency Neg Hx     Social History:  Social History   Substance and Sexual Activity  Alcohol Use No     Social History   Substance and Sexual Activity  Drug Use Yes    Types: Marijuana   Comment: has not smoked in last 3 days    Social History   Socioeconomic History   Marital status: Single    Spouse name: Not on file   Number of children: Not on file   Years of education: Not on file   Highest education level: Not on file  Occupational History   Not on file  Tobacco Use   Smoking status: Former    Types: Cigarettes   Smokeless tobacco: Never  Vaping Use   Vaping Use: Never used  Substance and Sexual Activity   Alcohol use: No   Drug use: Yes    Types: Marijuana    Comment: has not smoked in last 3 days   Sexual activity: Not on file  Other Topics Concern   Not on file  Social History Narrative   Not on file   Social Determinants of Health   Financial Resource Strain: Not on file  Food Insecurity: Not on file  Transportation Needs: Not on file  Physical Activity: Not on file  Stress: Not on file  Social Connections: Not on file   Additional Social History:  Sleep: Good  Appetite:  Good  Current Medications: Current Facility-Administered Medications  Medication Dose Route Frequency Provider Last Rate Last Admin   acetaminophen (TYLENOL) tablet 650 mg  650 mg Oral Q6H PRN Clapacs, Jackquline Denmark, MD   650 mg at 03/13/22 0814   alum & mag hydroxide-simeth (MAALOX/MYLANTA) 200-200-20 MG/5ML suspension 30 mL  30 mL Oral Q4H PRN Clapacs, Jackquline Denmark, MD       cholecalciferol (VITAMIN D) tablet 1,000 Units  1,000 Units Oral Daily Clapacs, Jackquline Denmark, MD   1,000 Units at 03/15/22 0749   dicyclomine (BENTYL) tablet 40 mg  40 mg Oral QID PRN Clapacs, Jackquline Denmark, MD   40 mg at 03/13/22 1815   diphenoxylate-atropine (LOMOTIL) 2.5-0.025 MG per tablet 1 tablet  1 tablet Oral QID PRN Clapacs, Jackquline Denmark, MD       dronabinol (MARINOL) capsule 5 mg  5 mg Oral BID Clapacs, Jackquline Denmark, MD   5 mg at 03/15/22 0749   drospirenone-ethinyl estradiol (YAZ) 3-0.02 MG per tablet 1 tablet  1 tablet Oral Q lunch Clapacs, John T, MD   1 tablet at  03/14/22 1242   fentaNYL (SUBLIMAZE) injection 25 mcg  25 mcg Intravenous Q5 min PRN Darleene Cleaver, Gijsbertus F, MD       LORazepam (ATIVAN) tablet 1 mg  1 mg Oral TID Clapacs, John T, MD   1 mg at 03/15/22 0540   lurasidone (LATUDA) tablet 40 mg  40 mg Oral Q supper Clapacs, John T, MD   40 mg at 03/14/22 1744   magnesium hydroxide (MILK OF MAGNESIA) suspension 30 mL  30 mL Oral Daily PRN Clapacs, Jackquline Denmark, MD       midazolam (VERSED) injection 2 mg  2 mg Intravenous Once Clapacs, John T, MD       ondansetron [facility]) injection 4 mg  4 mg Intravenous Once PRN Darleene Cleaver, Gijsbertus F, MD       ondansetron (ZOFRAN-ODT) disintegrating tablet 8 mg  8 mg Oral Q8H PRN Clapacs, John T, MD   8 mg at 03/13/22 0522   pantoprazole (PROTONIX) EC tablet 80 mg  80 mg Oral BID Clapacs, John T, MD   80 mg at 03/15/22 0750   propranolol (INDERAL) tablet 10 mg  10 mg Oral TID Clapacs, Jackquline Denmark, MD   10 mg at 03/15/22 0539   rosuvastatin (CRESTOR) tablet 10 mg  10 mg Oral QHS Clapacs, John T, MD   10 mg at 03/14/22 2125   spironolactone (ALDACTONE) tablet 100 mg  100 mg Oral Q1200 Clapacs, John T, MD   100 mg at 03/14/22 1210   SUMAtriptan (IMITREX) tablet 50 mg  50 mg Oral Daily PRN Clapacs, Jackquline Denmark, MD   50 mg at 03/13/22 0522   vitamin B-12 (CYANOCOBALAMIN) tablet 1,000 mcg  1,000 mcg Oral Daily Clapacs, Jackquline Denmark, MD   1,000 mcg at 03/15/22 0749   zolpidem (AMBIEN) tablet 10 mg  10 mg Oral QHS Clapacs, Jackquline Denmark, MD   10 mg at 03/14/22 2124    Lab Results:  Results for orders placed or performed during the hospital encounter of 03/11/22 (from the past 48 hour(s))  Glucose, capillary     Status: Abnormal   Collection Time: 03/14/22  5:47 AM  Result Value Ref Range   Glucose-Capillary 111 (H) 70 - 99 mg/dL    Comment: Glucose reference range applies only to samples taken after fasting for at least 8 hours.    Blood Alcohol level:  Lab Results  Component Value Date   ETH <10 03/10/2022   ETH <10 11/28/2019     Metabolic Disorder Labs: Lab Results  Component Value Date   HGBA1C 5.9 (H) 03/10/2022   MPG 123 03/10/2022   Lab Results  Component Value Date   PROLACTIN 19.4 03/10/2022   Lab Results  Component Value Date   CHOL 233 (H) 03/10/2022   TRIG 183 (H) 03/10/2022   HDL 87 03/10/2022   CHOLHDL 2.7 03/10/2022   VLDL 37 03/10/2022   LDLCALC 109 (H) 03/10/2022    Physical Findings: AIMS:  , ,  ,  ,    CIWA:    COWS:     Musculoskeletal: Strength & Muscle Tone: within normal limits Gait & Station: normal Patient leans: N/A  Psychiatric Specialty Exam:  Presentation  General Appearance: Appropriate for Environment; Casual; Fairly Groomed  Eye Contact:Good  Speech:Clear and Coherent; Normal Rate  Speech Volume:Normal  Handedness:Right   Mood and Affect  Mood:Depressed; Anxious  Affect:Congruent   Thought Process  Thought Processes:Coherent; Goal Directed; Linear  Descriptions of Associations:Intact  Orientation:Full (Time, Place and Person)  Thought Content:Logical  History of Schizophrenia/Schizoaffective disorder:No  Duration of Psychotic Symptoms:No data recorded Hallucinations:No data recorded Ideas of Reference:None  Suicidal Thoughts:No data recorded Homicidal Thoughts:No data recorded  Sensorium  Memory:Immediate Good; Recent Good; Remote Good  Judgment:Fair  Insight:Fair   Executive Functions  Concentration:Good  Attention Span:Good  Recall:Good  Fund of Knowledge:Good  Language:Good   Psychomotor Activity  Psychomotor Activity:No data recorded  Assets  Assets:Communication Skills; Desire for Improvement; Financial Resources/Insurance; Housing; Resilience; Social Support   Sleep  Sleep:No data recorded   Physical Exam: Physical Exam ROS Blood pressure (!) 121/94, pulse 79, temperature 98.7 F (37.1 C), temperature source Oral, resp. rate 18, height 5\' 3"  (1.6 m), weight 64 kg, SpO2 99 %. Body mass index is 24.98  kg/m.   Treatment Plan Summary: Daily contact with patient to assess and evaluate symptoms and progress in treatment, Medication management, and Plan continue current medications.  , DO 03/15/2022, 12:24 PM

## 2022-03-16 DIAGNOSIS — F332 Major depressive disorder, recurrent severe without psychotic features: Secondary | ICD-10-CM | POA: Diagnosis not present

## 2022-03-16 NOTE — Progress Notes (Addendum)
Patient appears irritable her thoughts are organized and coherent she denies SI/HI/AVH,she was offered emotional support no distress noted at this time will continue to monitor.

## 2022-03-16 NOTE — Plan of Care (Signed)
Patient pleasant on approach but tearful with conversation. Patient states " just being here is not doing any benefit for me." Patient complains about not having group therapy here. Patient states that ECT is helping her. Patient likes to get discharge tomorrow after ECT and her dad can bring her for outpatient ECT. Patient verbalized passive suicidal thoughts on & off. Denies HI and AVH. Appropriate with staff & peers. ADLs maintained. Appetite and energy level good. Support and encouragement given.

## 2022-03-16 NOTE — Progress Notes (Signed)
[facility] MD Progress Note  03/16/2022 2:19 PM Kayla Holden  MRN:  [id number] Subjective:  Patient is seen on rounds.  She complains that there has not been any group therapy.  She is looking forward to ECT.  At the same time, she would like to leave.  I told her that she needs to talk with Dr. Toni Amend about her discharge plan and ECT.  Principal Problem: Severe recurrent major depression without psychotic features (HCC) Diagnosis: Principal Problem:   Severe recurrent major depression without psychotic features (HCC) Active Problems:   Chronic daily headache   Hidradenitis suppurativa   Gastric reflux  Total Time spent with patient: 15 minutes  Past Psychiatric History: Past history of depression with suicidal ideation and good response to ECT  Past Medical History:  Past Medical History:  Diagnosis Date   Anxiety    Anxiety    Bipolar 1 disorder (HCC)    Chiari malformation    Complication of anesthesia    Depression    GERD (gastroesophageal reflux disease)    MDD (major depressive disorder)    Microscopic colitis    Migraine headache    PONV (postoperative nausea and vomiting)     Past Surgical History:  Procedure Laterality Date   APPENDECTOMY     CHOLECYSTECTOMY     INCISION AND DRAINAGE OF WOUND Left 10/28/2021   Procedure: KNEE ARTHROSCOPY WITH IRRIGATION AND DEBRIDEMENT;  Surgeon: Eugenia Mcalpine, MD;  Location: [facility];  Service: Orthopedics;  Laterality: Left;  60 okay per OR   KNEE SURGERY Left    3 times   MANDIBLE SURGERY     Family History:  Family History  Problem Relation Age of Onset   Cancer Maternal Grandmother    Cancer Paternal Grandfather    Allergic rhinitis Neg Hx    Angioedema Neg Hx    Asthma Neg Hx    Atopy Neg Hx    Eczema Neg Hx    Urticaria Neg Hx    Immunodeficiency Neg Hx     Social History:  Social History   Substance and Sexual Activity  Alcohol Use No     Social History   Substance and Sexual Activity  Drug Use Yes    Types: Marijuana   Comment: has not smoked in last 3 days    Social History   Socioeconomic History   Marital status: Single    Spouse name: Not on file   Number of children: Not on file   Years of education: Not on file   Highest education level: Not on file  Occupational History   Not on file  Tobacco Use   Smoking status: Former    Types: Cigarettes   Smokeless tobacco: Never  Vaping Use   Vaping Use: Never used  Substance and Sexual Activity   Alcohol use: No   Drug use: Yes    Types: Marijuana    Comment: has not smoked in last 3 days   Sexual activity: Not on file  Other Topics Concern   Not on file  Social History Narrative   Not on file   Social Determinants of Health   Financial Resource Strain: Not on file  Food Insecurity: Not on file  Transportation Needs: Not on file  Physical Activity: Not on file  Stress: Not on file  Social Connections: Not on file   Additional Social History:  Sleep: Good  Appetite:  Good  Current Medications: Current Facility-Administered Medications  Medication Dose Route Frequency Provider Last Rate Last Admin   acetaminophen (TYLENOL) tablet 650 mg  650 mg Oral Q6H PRN Clapacs, Jackquline Denmark, MD   650 mg at 03/13/22 0814   alum & mag hydroxide-simeth (MAALOX/MYLANTA) 200-200-20 MG/5ML suspension 30 mL  30 mL Oral Q4H PRN Clapacs, Jackquline Denmark, MD       cholecalciferol (VITAMIN D) tablet 1,000 Units  1,000 Units Oral Daily Clapacs, Jackquline Denmark, MD   1,000 Units at 03/16/22 8341   dicyclomine (BENTYL) tablet 40 mg  40 mg Oral QID PRN Clapacs, John T, MD   40 mg at 03/13/22 1815   diphenoxylate-atropine (LOMOTIL) 2.5-0.025 MG per tablet 1 tablet  1 tablet Oral QID PRN Clapacs, Jackquline Denmark, MD       dronabinol (MARINOL) capsule 5 mg  5 mg Oral BID Clapacs, John T, MD   5 mg at 03/16/22 0805   drospirenone-ethinyl estradiol (YAZ) 3-0.02 MG per tablet 1 tablet  1 tablet Oral Q lunch Clapacs, John T, MD   1 tablet at  03/16/22 1213   fentaNYL (SUBLIMAZE) injection 25 mcg  25 mcg Intravenous Q5 min PRN Darleene Cleaver, Gijsbertus F, MD       LORazepam (ATIVAN) tablet 1 mg  1 mg Oral TID Clapacs, John T, MD   1 mg at 03/16/22 1212   lurasidone (LATUDA) tablet 40 mg  40 mg Oral Q supper Clapacs, John T, MD   40 mg at 03/15/22 1644   magnesium hydroxide (MILK OF MAGNESIA) suspension 30 mL  30 mL Oral Daily PRN Clapacs, Jackquline Denmark, MD       midazolam (VERSED) injection 2 mg  2 mg Intravenous Once Clapacs, John T, MD       ondansetron [facility]) injection 4 mg  4 mg Intravenous Once PRN Darleene Cleaver, Gijsbertus F, MD       ondansetron (ZOFRAN-ODT) disintegrating tablet 8 mg  8 mg Oral Q8H PRN Clapacs, John T, MD   8 mg at 03/15/22 1457   pantoprazole (PROTONIX) EC tablet 80 mg  80 mg Oral BID Clapacs, John T, MD   80 mg at 03/16/22 0615   propranolol (INDERAL) tablet 10 mg  10 mg Oral TID Clapacs, John T, MD   10 mg at 03/16/22 1212   rosuvastatin (CRESTOR) tablet 10 mg  10 mg Oral QHS Clapacs, John T, MD   10 mg at 03/15/22 2205   spironolactone (ALDACTONE) tablet 100 mg  100 mg Oral Q1200 Clapacs, John T, MD   100 mg at 03/16/22 1213   SUMAtriptan (IMITREX) tablet 50 mg  50 mg Oral Daily PRN Clapacs, Jackquline Denmark, MD   50 mg at 03/13/22 0522   vitamin B-12 (CYANOCOBALAMIN) tablet 1,000 mcg  1,000 mcg Oral Daily Clapacs, Jackquline Denmark, MD   1,000 mcg at 03/16/22 0842   zolpidem (AMBIEN) tablet 10 mg  10 mg Oral QHS Clapacs, Jackquline Denmark, MD   10 mg at 03/15/22 2205    Lab Results: No results found for this or any previous visit (from the past 48 hour(s)).  Blood Alcohol level:  Lab Results  Component Value Date   [facility] <10 03/10/2022   ETH <10 11/28/2019    Metabolic Disorder Labs: Lab Results  Component Value Date   HGBA1C 5.9 (H) 03/10/2022   MPG 123 03/10/2022   Lab Results  Component Value Date   PROLACTIN 19.4 03/10/2022   Lab  Results  Component Value Date   CHOL 233 (H) 03/10/2022   TRIG 183 (H) 03/10/2022   HDL 87  03/10/2022   CHOLHDL 2.7 03/10/2022   VLDL 37 03/10/2022   LDLCALC 109 (H) 03/10/2022    Physical Findings: AIMS:  , ,  ,  ,    CIWA:    COWS:     Musculoskeletal: Strength & Muscle Tone: within normal limits Gait & Station: normal Patient leans: N/A  Psychiatric Specialty Exam:  Presentation  General Appearance: Appropriate for Environment; Casual; Fairly Groomed  Eye Contact:Good  Speech:Clear and Coherent; Normal Rate  Speech Volume:Normal  Handedness:Right   Mood and Affect  Mood:Depressed; Anxious  Affect:Congruent   Thought Process  Thought Processes:Coherent; Goal Directed; Linear  Descriptions of Associations:Intact  Orientation:Full (Time, Place and Person)  Thought Content:Logical  History of Schizophrenia/Schizoaffective disorder:No  Duration of Psychotic Symptoms:No data recorded Hallucinations:No data recorded Ideas of Reference:None  Suicidal Thoughts:No data recorded Homicidal Thoughts:No data recorded  Sensorium  Memory:Immediate Good; Recent Good; Remote Good  Judgment:Fair  Insight:Fair   Executive Functions  Concentration:Good  Attention Span:Good  Recall:Good  Fund of Knowledge:Good  Language:Good   Psychomotor Activity  Psychomotor Activity:No data recorded  Assets  Assets:Communication Skills; Desire for Improvement; Financial Resources/Insurance; Housing; Resilience; Social Support   Sleep  Sleep:No data recorded   Physical Exam: Physical Exam ROS Blood pressure (!) 121/91, pulse (!) 103, temperature 98.8 F (37.1 C), temperature source Oral, resp. rate 18, height 5\' 3"  (1.6 m), weight 64 kg, SpO2 100 %. Body mass index is 24.98 kg/m.   Treatment Plan Summary: Daily contact with patient to assess and evaluate symptoms and progress in treatment, Medication management, and Plan continue current medications.  , DO 03/16/2022, 2:19 PM

## 2022-03-17 ENCOUNTER — Ambulatory Visit: Payer: PPO

## 2022-03-17 ENCOUNTER — Encounter: Payer: Self-pay | Admitting: Psychiatry

## 2022-03-17 ENCOUNTER — Other Ambulatory Visit: Payer: Self-pay | Admitting: Psychiatry

## 2022-03-17 ENCOUNTER — Inpatient Hospital Stay: Payer: PPO | Admitting: Anesthesiology

## 2022-03-17 DIAGNOSIS — R200 Anesthesia of skin: Secondary | ICD-10-CM

## 2022-03-17 DIAGNOSIS — F332 Major depressive disorder, recurrent severe without psychotic features: Secondary | ICD-10-CM | POA: Diagnosis not present

## 2022-03-17 MED ORDER — LABETALOL HCL 5 MG/ML IV SOLN
INTRAVENOUS | Status: DC | PRN
  Administered 2022-03-17: 5 mg via INTRAVENOUS

## 2022-03-17 MED ORDER — KETOROLAC TROMETHAMINE 30 MG/ML IJ SOLN: 30.0000 mg | Freq: Once | INTRAMUSCULAR | Status: AC

## 2022-03-17 MED ORDER — DRONABINOL 5 MG PO CAPS
5.0000 mg | ORAL_CAPSULE | Freq: Two times a day (BID) | ORAL | 0 refills | Status: AC
Start: 2022-03-17 — End: ?

## 2022-03-17 MED ORDER — GLYCOPYRROLATE 0.2 MG/ML IJ SOLN
INTRAMUSCULAR | Status: AC
  Administered 2022-03-17: 0.1 mg via INTRAVENOUS
  Filled 2022-03-17: qty 1

## 2022-03-17 MED ORDER — MIDAZOLAM HCL 2 MG/2ML IJ SOLN: 2.0000 mg | Freq: Once | INTRAMUSCULAR | Status: DC

## 2022-03-17 MED ORDER — ONDANSETRON 8 MG PO TBDP
8.0000 mg | ORAL_TABLET | Freq: Three times a day (TID) | ORAL | 0 refills | Status: DC | PRN
Start: 2022-03-17 — End: 2024-06-29

## 2022-03-17 MED ORDER — SODIUM CHLORIDE 0.9 % IV SOLN
500.0000 mL | Freq: Once | INTRAVENOUS | Status: AC
  Administered 2022-03-17: 500 mL via INTRAVENOUS

## 2022-03-17 MED ORDER — VITAMIN D3 25 MCG PO TABS: 1000.0000 [IU] | ORAL_TABLET | Freq: Every day | ORAL | 0 refills | Status: DC

## 2022-03-17 MED ORDER — ZOLPIDEM TARTRATE ER 12.5 MG PO TBCR
12.5000 mg | EXTENDED_RELEASE_TABLET | Freq: Every day | ORAL | 0 refills | Status: AC
Start: 2022-03-17 — End: ?

## 2022-03-17 MED ORDER — RIZATRIPTAN BENZOATE 10 MG PO TABS: 10.0000 mg | ORAL_TABLET | ORAL | 1 refills | Status: DC | PRN

## 2022-03-17 MED ORDER — LORAZEPAM 1 MG PO TABS
1.0000 mg | ORAL_TABLET | Freq: Three times a day (TID) | ORAL | 0 refills | Status: DC
Start: 2022-03-17 — End: 2022-10-20

## 2022-03-17 MED ORDER — PANTOPRAZOLE SODIUM 40 MG PO TBEC: 80.0000 mg | DELAYED_RELEASE_TABLET | Freq: Two times a day (BID) | ORAL | 0 refills | Status: DC

## 2022-03-17 MED ORDER — DICYCLOMINE HCL 20 MG PO TABS: 40.0000 mg | ORAL_TABLET | Freq: Four times a day (QID) | ORAL | 0 refills | Status: DC | PRN

## 2022-03-17 MED ORDER — ROSUVASTATIN CALCIUM 10 MG PO TABS: 10.0000 mg | ORAL_TABLET | Freq: Every day | ORAL | 0 refills | Status: AC

## 2022-03-17 MED ORDER — MIDAZOLAM HCL 2 MG/2ML IJ SOLN
INTRAMUSCULAR | Status: DC | PRN
  Administered 2022-03-17: 2 mg via INTRAVENOUS

## 2022-03-17 MED ORDER — SODIUM CHLORIDE 0.9 % IV SOLN: INTRAVENOUS | Status: DC | PRN

## 2022-03-17 MED ORDER — METHOHEXITAL SODIUM 100 MG/10ML IV SOSY
PREFILLED_SYRINGE | INTRAVENOUS | Status: DC | PRN
  Administered 2022-03-17: 70 mg via INTRAVENOUS

## 2022-03-17 MED ORDER — LURASIDONE HCL 40 MG PO TABS
40.0000 mg | ORAL_TABLET | Freq: Every day | ORAL | 0 refills | Status: DC
Start: 2022-03-17 — End: 2024-01-13

## 2022-03-17 MED ORDER — GLYCOPYRROLATE 0.2 MG/ML IJ SOLN: 0.1000 mg | Freq: Once | INTRAMUSCULAR | Status: AC

## 2022-03-17 MED ORDER — CYANOCOBALAMIN 1000 MCG PO TABS: 1000.0000 ug | ORAL_TABLET | Freq: Every day | ORAL | 0 refills | Status: DC

## 2022-03-17 MED ORDER — METHOHEXITAL SODIUM 0.5 G IJ SOLR
INTRAMUSCULAR | Status: AC
  Filled 2022-03-17: qty 500

## 2022-03-17 MED ORDER — KETOROLAC TROMETHAMINE 30 MG/ML IJ SOLN
INTRAMUSCULAR | Status: AC
  Administered 2022-03-17: 30 mg via INTRAVENOUS
  Filled 2022-03-17: qty 1

## 2022-03-17 MED ORDER — ONDANSETRON 4 MG PO TBDP
4.0000 mg | ORAL_TABLET | Freq: Once | ORAL | Status: AC
  Administered 2022-03-17: 4 mg via ORAL
  Filled 2022-03-17: qty 1

## 2022-03-17 MED ORDER — MIDAZOLAM HCL 2 MG/2ML IJ SOLN
INTRAMUSCULAR | Status: AC
  Filled 2022-03-17: qty 2

## 2022-03-17 MED ORDER — SPIRONOLACTONE 100 MG PO TABS: 100.0000 mg | ORAL_TABLET | Freq: Every day | ORAL | 0 refills | Status: AC

## 2022-03-17 MED ORDER — PROPRANOLOL HCL 10 MG PO TABS
10.0000 mg | ORAL_TABLET | Freq: Three times a day (TID) | ORAL | 0 refills | Status: DC
Start: 2022-03-17 — End: 2024-06-29

## 2022-03-17 MED ORDER — SUCCINYLCHOLINE CHLORIDE 200 MG/10ML IV SOSY
PREFILLED_SYRINGE | INTRAVENOUS | Status: DC | PRN
  Administered 2022-03-17: 80 mg via INTRAVENOUS

## 2022-03-17 MED ORDER — ONDANSETRON HCL 4 MG/2ML IJ SOLN
INTRAMUSCULAR | Status: AC
  Administered 2022-03-17: 4 mg via INTRAVENOUS
  Filled 2022-03-17: qty 2

## 2022-03-17 NOTE — Discharge Summary (Addendum)
Physician Discharge Summary Note  Patient:  Kayla Holden is an 41 y.o., female MRN:  [id number] DOB:  10/02/1980 Patient phone:  [phone] (home)  Patient address:   [address] [place] [id number],  Total Time spent with patient: 30 minutes  Date of Admission:  03/11/2022 Date of Discharge: 03/17/2022  Reason for Admission: Patient was admitted because of severe recurrent depression with positive past response to ECT.  Plan was for initiation ECT treatment for ongoing treatment of severe depression  Principal Problem: Severe recurrent major depression without psychotic features [facility]) Discharge Diagnoses: Principal Problem:   Severe recurrent major depression without psychotic features (HCC) Active Problems:   Chronic daily headache   Hidradenitis suppurativa   Gastric reflux   Past Psychiatric History: Past history of recurrent episodes of depression with current episode chronic for more than several months and failure to respond to medication.  Had ECT in the past with positive response  Past Medical History:  Past Medical History:  Diagnosis Date   Anxiety    Anxiety    Bipolar 1 disorder (HCC)    Chiari malformation    Complication of anesthesia    Depression    GERD (gastroesophageal reflux disease)    MDD (major depressive disorder)    Microscopic colitis    Migraine headache    PONV (postoperative nausea and vomiting)     Past Surgical History:  Procedure Laterality Date   APPENDECTOMY     CHOLECYSTECTOMY     INCISION AND DRAINAGE OF WOUND Left 10/28/2021   Procedure: KNEE ARTHROSCOPY WITH IRRIGATION AND DEBRIDEMENT;  Surgeon: Eugenia Mcalpine, MD;  Location: [facility];  Service: Orthopedics;  Laterality: Left;  60 okay per OR   KNEE SURGERY Left    3 times   MANDIBLE SURGERY     Family History:  Family History  Problem Relation Age of Onset   Cancer Maternal Grandmother    Cancer Paternal Grandfather    Allergic rhinitis Neg Hx    Angioedema  Neg Hx    Asthma Neg Hx    Atopy Neg Hx    Eczema Neg Hx    Urticaria Neg Hx    Immunodeficiency Neg Hx    Family Psychiatric  History: History of depression Social History:  Social History   Substance and Sexual Activity  Alcohol Use No     Social History   Substance and Sexual Activity  Drug Use Yes   Types: Marijuana   Comment: has not smoked in last 3 days    Social History   Socioeconomic History   Marital status: Single    Spouse name: Not on file   Number of children: Not on file   Years of education: Not on file   Highest education level: Not on file  Occupational History   Not on file  Tobacco Use   Smoking status: Former    Types: Cigarettes   Smokeless tobacco: Never  Vaping Use   Vaping Use: Never used  Substance and Sexual Activity   Alcohol use: No   Drug use: Yes    Types: Marijuana    Comment: has not smoked in last 3 days   Sexual activity: Not on file  Other Topics Concern   Not on file  Social History Narrative   Not on file   Social Determinants of Health   Financial Resource Strain: Not on file  Food Insecurity: Not on file  Transportation Needs: Not on file  Physical Activity: Not on  file  Stress: Not on file  Social Connections: Not on file    Hospital Course: Patient admitted to psychiatric unit.  15-minute checks maintained.  Patient did not display any dangerous aggressive violent or suicidal behavior during her time in the hospital.  Her behavior was calm and appropriate and cooperative throughout.  She maintained a focus on appropriate treatment and appeared to be forthcoming.  Patient received ECT treatment on 26 July.  We had no staff to provide treatment on the 28th.  Second treatment was done today on the 31st.  We will have no staff Wednesday or Friday.  Patient at this point says that while she is still depressed and anxious she has no intention of acting on it and feels capable of getting help if she should have such  thoughts.  She is requesting discharge and is requesting to follow up with outpatient ECT treatment at this point she appears to be stable enough with an outpatient plan that we will discharge her home today and agree that she will return 1 week from today to continue ECT as an outpatient Addendum: Patient was identified as having cannabis on her drug screen on admission but she is also taking Marinol a prescription medication which will cause a positive cannabis screen.  Patient has stated that she has not been abusing any cannabis products or drugs and we have no reason at all to think otherwise.  Physical Findings: AIMS:  , ,  ,  ,    CIWA:    COWS:     Musculoskeletal: Strength & Muscle Tone: within normal limits Gait & Station: normal Patient leans: N/A   Psychiatric Specialty Exam:  Presentation  General Appearance: Appropriate for Environment; Casual; Fairly Groomed  Eye Contact:Good  Speech:Clear and Coherent; Normal Rate  Speech Volume:Normal  Handedness:Right   Mood and Affect  Mood:Depressed; Anxious  Affect:Congruent   Thought Process  Thought Processes:Coherent; Goal Directed; Linear  Descriptions of Associations:Intact  Orientation:Full (Time, Place and Person)  Thought Content:Logical  History of Schizophrenia/Schizoaffective disorder:No  Duration of Psychotic Symptoms:No data recorded Hallucinations:No data recorded Ideas of Reference:None  Suicidal Thoughts:No data recorded Homicidal Thoughts:No data recorded  Sensorium  Memory:Immediate Good; Recent Good; Remote Good  Judgment:Fair  Insight:Fair   Executive Functions  Concentration:Good  Attention Span:Good  Recall:Good  Fund of Knowledge:Good  Language:Good   Psychomotor Activity  Psychomotor Activity:No data recorded  Assets  Assets:Communication Skills; Desire for Improvement; Financial Resources/Insurance; Housing; Resilience; Social Support   Sleep  Sleep:No data  recorded   Physical Exam: Physical Exam Vitals reviewed.  Constitutional:      Appearance: Normal appearance.  HENT:     Head: Normocephalic and atraumatic.     Mouth/Throat:     Pharynx: Oropharynx is clear.  Eyes:     Pupils: Pupils are equal, round, and reactive to light.  Cardiovascular:     Rate and Rhythm: Normal rate and regular rhythm.  Pulmonary:     Effort: Pulmonary effort is normal.     Breath sounds: Normal breath sounds.  Abdominal:     General: Abdomen is flat.     Palpations: Abdomen is soft.  Musculoskeletal:        General: Normal range of motion.  Skin:    General: Skin is warm and dry.  Neurological:     General: No focal deficit present.     Mental Status: She is alert. Mental status is at baseline.  Psychiatric:  Attention and Perception: Attention normal.        Mood and Affect: Mood is anxious and depressed.        Speech: Speech normal.        Behavior: Behavior is cooperative.        Thought Content: Thought content includes suicidal ideation. Thought content does not include suicidal plan.        Cognition and Memory: Cognition normal.        Judgment: Judgment normal.    Review of Systems  Constitutional: Negative.   HENT: Negative.    Eyes: Negative.   Respiratory: Negative.    Cardiovascular: Negative.   Gastrointestinal: Negative.   Musculoskeletal: Negative.   Skin: Negative.   Neurological: Negative.   Psychiatric/Behavioral:  Positive for depression and suicidal ideas. Negative for hallucinations and substance abuse. The patient is nervous/anxious.    Blood pressure 125/89, pulse 79, temperature 99.1 F (37.3 C), resp. rate 13, height 5\' 3"  (1.6 m), weight 64 kg, SpO2 98 %. Body mass index is 24.98 kg/m.   Social History   Tobacco Use  Smoking Status Former   Types: Cigarettes  Smokeless Tobacco Never   Tobacco Cessation:  N/A, patient does not currently use tobacco products   Blood Alcohol level:  Lab Results   Component Value Date   ETH <10 03/10/2022   ETH <10 11/28/2019    Metabolic Disorder Labs:  Lab Results  Component Value Date   HGBA1C 5.9 (H) 03/10/2022   MPG 123 03/10/2022   Lab Results  Component Value Date   PROLACTIN 19.4 03/10/2022   Lab Results  Component Value Date   CHOL 233 (H) 03/10/2022   TRIG 183 (H) 03/10/2022   HDL 87 03/10/2022   CHOLHDL 2.7 03/10/2022   VLDL 37 03/10/2022   LDLCALC 109 (H) 03/10/2022    See Psychiatric Specialty Exam and Suicide Risk Assessment completed by Attending Physician prior to discharge.  Discharge destination:  Home  Is patient on multiple antipsychotic therapies at discharge:  No   Has Patient had three or more failed trials of antipsychotic monotherapy by history:  No  Recommended Plan for Multiple Antipsychotic Therapies: NA  Discharge Instructions     Diet - low sodium heart healthy   Complete by: As directed    Increase activity slowly   Complete by: As directed         Follow-up Information     [facility] Follow up on 03/19/2022.   Specialty: Behavioral Health Why: You have an appointment scheduled with your provider, 05/19/2022, at 9:40am on Wednesdsay August 2nd, 03/19/22. Please contact their office if you need to reschedule. Thanks! Contact information: [address] [place] [phone]         [facility] Follow up.   Why: Please contact your therapist today, 03/17/22 to schedule your hospital follow up appointment within the next 7 days. Thanks! Contact information: Address: [address], [place], [place] Phone: [phone]                Follow-up recommendations: Follow up with outpatient psychiatric treatment continuing current medication.  ECT appointment 1 week from today to continue outpatient course of treatment  Comments: Patient agrees to plan.  Prescriptions provided.  She agrees to seek  immediately help if suicidal thoughts return  Signed: [phone], MD 03/17/2022, 2:24 PM

## 2022-03-17 NOTE — H&P (Signed)
Kayla Holden is an 41 y.o. female.   Chief Complaint: depressed HPI: recuurent severe depression  Past Medical History:  Diagnosis Date   Anxiety    Anxiety    Bipolar 1 disorder (HCC)    Chiari malformation    Complication of anesthesia    Depression    GERD (gastroesophageal reflux disease)    MDD (major depressive disorder)    Microscopic colitis    Migraine headache    PONV (postoperative nausea and vomiting)     Past Surgical History:  Procedure Laterality Date   APPENDECTOMY     CHOLECYSTECTOMY     INCISION AND DRAINAGE OF WOUND Left 10/28/2021   Procedure: KNEE ARTHROSCOPY WITH IRRIGATION AND DEBRIDEMENT;  Surgeon: Eugenia Mcalpine, MD;  Location: [facility];  Service: Orthopedics;  Laterality: Left;  60 okay per OR   KNEE SURGERY Left    3 times   MANDIBLE SURGERY      Family History  Problem Relation Age of Onset   Cancer Maternal Grandmother    Cancer Paternal Grandfather    Allergic rhinitis Neg Hx    Angioedema Neg Hx    Asthma Neg Hx    Atopy Neg Hx    Eczema Neg Hx    Urticaria Neg Hx    Immunodeficiency Neg Hx    Social History:  reports that she has quit smoking. Her smoking use included cigarettes. She has never used smokeless tobacco. She reports current drug use. Drug: Marijuana. She reports that she does not drink alcohol.  Allergies:  Allergies  Allergen Reactions   Codeine Other (See Comments)    Patient has "CYP2D6," so NO CODEINE   Fioricet-Codeine [Butalbital-Apap-Caff-Cod] Other (See Comments)    Patient has "CYP2D6" and cannot tolerate codeine, palpitations, rapid heart rate   Isoniazid Shortness Of Breath   Ketorolac Tromethamine Other (See Comments)    Abdominal Pain if given PO. It has to be given IV   Rifampin Shortness Of Breath and Hives   Tape Other (See Comments)    Paper tape causes blisters    Clindamycin Diarrhea and Other (See Comments)    Abdominal pain, also   Ketoprofen Other (See Comments)    Abdominal pain and  cannot take due to history of colitis   Nsaids Other (See Comments)    History of colitis    Medications Prior to Admission  Medication Sig Dispense Refill   desvenlafaxine (PRISTIQ) 50 MG 24 hr tablet Take 50 mg by mouth daily.     diphenoxylate-atropine (LOMOTIL) 2.5-0.025 MG tablet Take 1 tablet by mouth 4 (four) times daily as needed for diarrhea or loose stools.     drospirenone-ethinyl estradiol (YAZ) 3-0.02 MG tablet Take 1 tablet by mouth daily with lunch.     LATUDA 40 MG TABS tablet Take 40 mg by mouth See admin instructions. Take 40 mg by mouth at 6 PM     LORazepam (ATIVAN) 1 MG tablet Take 1 mg by mouth every 6 (six) hours.     propranolol (INDERAL) 10 MG tablet Take 10 mg by mouth 4 (four) times daily.     spironolactone (ALDACTONE) 50 MG tablet Take 100 mg by mouth daily at 12 noon.     [DISCONTINUED] dicyclomine (BENTYL) 20 MG tablet Take 40 mg by mouth 4 (four) times daily as needed for spasms.     [DISCONTINUED] dronabinol (MARINOL) 5 MG capsule Take 5 mg by mouth 2 (two) times daily.     [DISCONTINUED] ondansetron (ZOFRAN-ODT) 8  MG disintegrating tablet Take 8 mg by mouth every 8 (eight) hours as needed for nausea (DISSOLVE ORALLY).     [DISCONTINUED] pantoprazole (PROTONIX) 40 MG tablet Take 80 mg by mouth 2 (two) times daily.     [DISCONTINUED] rizatriptan (MAXALT) 10 MG tablet Take 10 mg by mouth as needed for migraine (and may repeat once in 2 hours, if no relief).     [DISCONTINUED] rosuvastatin (CRESTOR) 10 MG tablet Take 10 mg by mouth at bedtime.     [DISCONTINUED] zolpidem (AMBIEN CR) 12.5 MG CR tablet Take 12.5 mg by mouth at bedtime.      No results found for this or any previous visit (from the past 48 hour(s)). No results found.  Review of Systems  Constitutional: Negative.   HENT: Negative.    Eyes: Negative.   Respiratory: Negative.    Cardiovascular: Negative.   Gastrointestinal: Negative.   Musculoskeletal: Negative.   Skin: Negative.    Neurological: Negative.   Hematological: Negative.   Psychiatric/Behavioral: Negative.    All other systems reviewed and are negative.   Blood pressure (!) 128/95, pulse 95, temperature 98.8 F (37.1 C), temperature source Oral, resp. rate 18, height 5\' 3"  (1.6 m), weight 64 kg, SpO2 98 %. Physical Exam Vitals and nursing note reviewed.  Constitutional:      Appearance: She is well-developed.  HENT:     Head: Normocephalic and atraumatic.  Eyes:     Conjunctiva/sclera: Conjunctivae normal.     Pupils: Pupils are equal, round, and reactive to light.  Cardiovascular:     Heart sounds: Normal heart sounds.  Pulmonary:     Effort: Pulmonary effort is normal.  Abdominal:     Palpations: Abdomen is soft.  Musculoskeletal:        General: Normal range of motion.     Cervical back: Normal range of motion.  Skin:    General: Skin is warm and dry.  Neurological:     General: No focal deficit present.     Mental Status: She is alert.  Psychiatric:        Mood and Affect: Mood normal.      Assessment/Plan Ect today then dc home with outpatient ect  , MD 03/17/2022, 12:57 PM

## 2022-03-17 NOTE — BHH Suicide Risk Assessment (Signed)
[facility] Discharge Suicide Risk Assessment   Principal Problem: Severe recurrent major depression without psychotic features (HCC) Discharge Diagnoses: Principal Problem:   Severe recurrent major depression without psychotic features (HCC) Active Problems:   Chronic daily headache   Hidradenitis suppurativa   Gastric reflux   Total Time spent with patient: 30 minutes  Musculoskeletal: Strength & Muscle Tone: within normal limits Gait & Station: normal Patient leans: N/A  Psychiatric Specialty Exam  Presentation  General Appearance: Appropriate for Environment; Casual; Fairly Groomed  Eye Contact:Good  Speech:Clear and Coherent; Normal Rate  Speech Volume:Normal  Handedness:Right   Mood and Affect  Mood:Depressed; Anxious  Duration of Depression Symptoms: Greater than two weeks  Affect:Congruent   Thought Process  Thought Processes:Coherent; Goal Directed; Linear  Descriptions of Associations:Intact  Orientation:Full (Time, Place and Person)  Thought Content:Logical  History of Schizophrenia/Schizoaffective disorder:No  Duration of Psychotic Symptoms:No data recorded Hallucinations:No data recorded Ideas of Reference:None  Suicidal Thoughts:No data recorded Homicidal Thoughts:No data recorded  Sensorium  Memory:Immediate Good; Recent Good; Remote Good  Judgment:Fair  Insight:Fair   Executive Functions  Concentration:Good  Attention Span:Good  Recall:Good  Fund of Knowledge:Good  Language:Good   Psychomotor Activity  Psychomotor Activity:No data recorded  Assets  Assets:Communication Skills; Desire for Improvement; Financial Resources/Insurance; Housing; Resilience; Social Support   Sleep  Sleep:No data recorded  Physical Exam: Physical Exam Vitals and nursing note reviewed.  Constitutional:      Appearance: Normal appearance.  HENT:     Head: Normocephalic and atraumatic.     Mouth/Throat:     Pharynx: Oropharynx is clear.   Eyes:     Pupils: Pupils are equal, round, and reactive to light.  Cardiovascular:     Rate and Rhythm: Normal rate and regular rhythm.  Pulmonary:     Effort: Pulmonary effort is normal.     Breath sounds: Normal breath sounds.  Abdominal:     General: Abdomen is flat.     Palpations: Abdomen is soft.  Musculoskeletal:        General: Normal range of motion.  Skin:    General: Skin is warm and dry.  Neurological:     General: No focal deficit present.     Mental Status: She is alert. Mental status is at baseline.  Psychiatric:        Attention and Perception: Attention normal.        Mood and Affect: Mood is depressed.        Speech: Speech is delayed.        Behavior: Behavior is slowed.        Thought Content: Thought content normal.        Cognition and Memory: Cognition normal.        Judgment: Judgment normal.    Review of Systems  Constitutional: Negative.   HENT: Negative.    Eyes: Negative.   Respiratory: Negative.    Cardiovascular: Negative.   Gastrointestinal:  Positive for nausea.  Musculoskeletal: Negative.   Skin: Negative.   Neurological: Negative.   Psychiatric/Behavioral:  Positive for depression. Negative for hallucinations, substance abuse and suicidal ideas. The patient is nervous/anxious.    Blood pressure (!) 131/98, pulse 96, temperature 98.8 F (37.1 C), temperature source Oral, resp. rate 18, height 5\' 3"  (1.6 m), weight 64 kg, SpO2 96 %. Body mass index is 24.98 kg/m.  Mental Status Per Nursing Assessment::   On Admission:  Suicide plan  Demographic Factors:  Caucasian and Living alone  Loss Factors: Decline in  physical health  Historical Factors: Impulsivity  Risk Reduction Factors:   Positive social support, Positive therapeutic relationship, and Positive coping skills or problem solving skills  Continued Clinical Symptoms:  Depression:   Severe  Cognitive Features That Contribute To Risk:  None    Suicide Risk:  Minimal:  No identifiable suicidal ideation.  Patients presenting with no risk factors but with morbid ruminations; may be classified as minimal risk based on the severity of the depressive symptoms   Follow-up Information     [facility] Follow up on 03/19/2022.   Specialty: Behavioral Health Why: You have an appointment scheduled with your provider, Kerin Salen, at 9:40am on Wednesdsay August 2nd, 03/19/22. Please contact their office if you need to reschedule. Thanks! Contact information: [address] [place] [address] [phone]         [facility] Follow up.   Why: Please contact your therapist today, 03/17/22 to schedule your hospital follow up appointment within the next 7 days. Thanks! Contact information: Address: [address], [place], [place] [address] Phone: [phone]                Plan Of Care/Follow-up recommendations:  Patient will have ECT treatment today and then be discharged.  She is still having symptoms of depression but does not have active thoughts of suicide and is agreeable to an aggressive outpatient treatment plan including continued medication management and outpatient ECT with a follow-up treatment after today on Monday.  This is the earliest we can go as treatments are again canceled Wednesday and Friday.  Patient understands that if suicidal ideation recurs she needs to immediately get help for safety.  Mordecai Rasmussen, MD 03/17/2022, 11:15 AM

## 2022-03-17 NOTE — Progress Notes (Signed)
Patient pleasant and cooperative on approach. Sated that ECT helped her with her depression. Denies SI,HI and AVH. Verbalized discharge instructions,follow up care and prescriptions. Returned all belongings from [employer]. Home medications returned. Patient escorted out by staff and transported by family.

## 2022-03-17 NOTE — Transfer of Care (Signed)
Immediate Anesthesia Transfer of Care Note  Patient: Kayla Holden  Procedure(s) Performed: ECT TX  Patient Location: PACU  Anesthesia Type:General  Level of Consciousness: awake and sedated  Airway & Oxygen Therapy: Patient Spontanous Breathing and Patient connected to face mask oxygen  Post-op Assessment: Report given to RN and Post -op Vital signs reviewed and stable  Post vital signs: Reviewed and stable  Last Vitals:  Vitals Value Taken Time  BP 138/94 03/17/22 1411  Temp 37.3 C 03/17/22 1408  Pulse 89 03/17/22 1411  Resp 13 03/17/22 1411  SpO2 100 % 03/17/22 1411  Vitals shown include unvalidated device data.  Last Pain:  Vitals:   03/17/22 1408  TempSrc:   PainSc: Asleep      Patients Stated Pain Goal: 0 (03/14/22 0858)  Complications: No notable events documented.

## 2022-03-17 NOTE — Procedures (Signed)
ECT SERVICES Physician's Interval Evaluation & Treatment Note  Patient Identification: Kayla Holden MRN:  [id number] Date of Evaluation:  03/17/2022 TX #: 2  MADRS:   MMSE:   P.E. Findings:  No change to physical exam  Psychiatric Interval Note:  Continues to report subjective depression and passive suicidal thoughts but no suicidal intent or plan.  Behavior continues to be appropriate and treatment motivated  Subjective:  Patient is a 41 y.o. female seen for evaluation for Electroconvulsive Therapy. Denies acute intent of suicide although continues to endorse depression.  Denies psychotic symptoms.  Treatment Summary:   []   Right Unilateral             [x]  Bilateral   % Energy : 1.0 ms 40%   Impedance: 2090 ohms  Seizure Energy Index: 19,161 V squared  Postictal Suppression Index: 93%  Seizure Concordance Index: 99%  Medications  Pre Shock: Brevital 70 mg succinylcholine 80 mg  Post Shock: Versed 2 mg  Seizure Duration: EMG 46 seconds EEG 72 seconds   Comments: Well-tolerated treatment.  Because of staffing shortage there will be no ECT Wednesday or Friday of this week.  Patient requests discharge with a plan to follow up with outpatient treatment 1 week from today.  Lungs:  [x]   Clear to auscultation               []  Other:   Heart:    [x]   Regular rhythm             []  irregular rhythm    [x]   Previous H&P reviewed, patient examined and there are NO CHANGES                 []   Previous H&P reviewed, patient examined and there are changes noted.   Monday, MD 7/31/20232:22 PM

## 2022-03-17 NOTE — Progress Notes (Signed)
  [facility] Adult Case Management Discharge Plan :  Will you be returning to the same living situation after discharge:  Yes,  pt reports that she is returning home At discharge, do you have transportation home?: Yes,  pt reports that her father will provide transportation. Do you have the ability to pay for your medications: Yes,  [employer]   Release of information consent forms completed and in the chart;  Patient's signature needed at discharge.  Patient to Follow up at:  Follow-up Information     Center, [facility] Follow up on 03/19/2022.   Specialty: Behavioral Health Why: You have an appointment scheduled with your provider, Kerin Salen, at 9:40am on Wednesdsay August 2nd, 03/19/22. Please contact their office if you need to reschedule. Thanks! Contact information: [address] [place] [address] [phone]         [facility] Follow up.   Why: Please contact your therapist today, 03/17/22 to schedule your hospital follow up appointment within the next 7 days. Thanks! Contact information: Address: [address], [address], [place], [place] [address] Phone: [phone]                Next level of care provider has access to [facility] Link:no  Safety Planning and Suicide Prevention discussed: Yes,  SPE completed with the patient.     Has patient been referred to the Quitline?: Patient refused referral  Patient has been referred for addiction treatment: Pt. refused referral  Harden Mo, LCSW 03/17/2022, 12:28 PM

## 2022-03-17 NOTE — Anesthesia Preprocedure Evaluation (Addendum)
Anesthesia Evaluation  Patient identified by MRN, date of birth, ID band Patient awake    Reviewed: Allergy & Precautions, NPO status , Patient's Chart, lab work & pertinent test results  History of Anesthesia Complications (+) PONV and history of anesthetic complications  Airway Mallampati: III  TM Distance: >3 FB Neck ROM: full    Dental  (+) Chipped   Pulmonary neg shortness of breath, former smoker,    Pulmonary exam normal        Cardiovascular Exercise Tolerance: Good (-) anginanegative cardio ROS Normal cardiovascular exam     Neuro/Psych  Headaches, PSYCHIATRIC DISORDERS    GI/Hepatic Neg liver ROS, GERD  Controlled,  Endo/Other  negative endocrine ROS  Renal/GU negative Renal ROS  negative genitourinary   Musculoskeletal   Abdominal   Peds  Hematology negative hematology ROS (+)   Anesthesia Other Findings Past Medical History: No date: Anxiety No date: Anxiety No date: Bipolar 1 disorder (HCC) No date: Chiari malformation No date: Complication of anesthesia No date: Depression No date: GERD (gastroesophageal reflux disease) No date: MDD (major depressive disorder) No date: Microscopic colitis No date: Migraine headache No date: PONV (postoperative nausea and vomiting)  Past Surgical History: No date: APPENDECTOMY No date: CHOLECYSTECTOMY 10/28/2021: INCISION AND DRAINAGE OF WOUND; Left     Comment:  Procedure: KNEE ARTHROSCOPY WITH IRRIGATION AND               DEBRIDEMENT;  Surgeon: Collins, Robert, MD;  Location: [facility]              ORS;  Service: Orthopedics;  Laterality: Left;  60 okay               per OR No date: KNEE SURGERY; Left     Comment:  3 times No date: MANDIBLE SURGERY  BMI    Body Mass Index: 24.98 kg/m      Reproductive/Obstetrics negative OB ROS                             Anesthesia Physical  Anesthesia Plan  ASA: 2  Anesthesia Plan:  General   Post-op Pain Management:    Induction: Intravenous  PONV Risk Score and Plan: Propofol infusion and TIVA  Airway Management Planned: Mask  Additional Equipment:   Intra-op Plan:   Post-operative Plan:   Informed Consent: I have reviewed the patients History and Physical, chart, labs and discussed the procedure including the risks, benefits and alternatives for the proposed anesthesia with the patient or authorized representative who has indicated his/her understanding and acceptance.     Dental Advisory Given  Plan Discussed with: Anesthesiologist, CRNA and Surgeon  Anesthesia Plan Comments: (Patient consented for risks of anesthesia including but not limited to:  - adverse reactions to medications - risk of airway placement if required - damage to eyes, teeth, lips or other oral mucosa - nerve damage due to positioning  - sore throat or hoarseness - Damage to heart, brain, nerves, lungs, other parts of body or loss of life  Patient voiced understanding.)        Anesthesia Quick Evaluation  

## 2022-03-17 NOTE — Anesthesia Procedure Notes (Signed)
Date/Time: 03/17/2022 2:00 PM  Performed by: Ginger Carne, CRNAPre-anesthesia Checklist: Patient identified, Emergency Drugs available, Suction available, Patient being monitored and Timeout performed Patient Re-evaluated:Patient Re-evaluated prior to induction Oxygen Delivery Method: Circle system utilized Preoxygenation: Pre-oxygenation with 100% oxygen Induction Type: IV induction Ventilation: Mask ventilation without difficulty

## 2022-03-18 NOTE — Anesthesia Postprocedure Evaluation (Signed)
Anesthesia Post Note  Patient: Kayla Holden  Procedure(s) Performed: ECT TX  Patient location during evaluation: PACU Anesthesia Type: General Level of consciousness: awake and alert Pain management: pain level controlled Vital Signs Assessment: post-procedure vital signs reviewed and stable Respiratory status: spontaneous breathing, nonlabored ventilation, respiratory function stable and patient connected to nasal cannula oxygen Cardiovascular status: blood pressure returned to baseline and stable Postop Assessment: no apparent nausea or vomiting Anesthetic complications: no   No notable events documented.   Last Vitals:  Vitals:   03/17/22 1430 03/17/22 1440  BP: 120/88 (!) 126/95  Pulse: 80 82  Resp: 12 14  Temp:  37 C  SpO2: 100% 100%    Last Pain:  Vitals:   03/17/22 1440  TempSrc: Temporal  PainSc: 0-No pain                 Cleda Mccreedy Valentina Alcoser

## 2022-03-19 DIAGNOSIS — F419 Anxiety disorder, unspecified: Secondary | ICD-10-CM | POA: Diagnosis not present

## 2022-03-19 DIAGNOSIS — F314 Bipolar disorder, current episode depressed, severe, without psychotic features: Secondary | ICD-10-CM | POA: Diagnosis not present

## 2022-03-23 ENCOUNTER — Other Ambulatory Visit: Payer: Self-pay | Admitting: Psychiatry

## 2022-03-23 DIAGNOSIS — R200 Anesthesia of skin: Secondary | ICD-10-CM

## 2022-03-24 ENCOUNTER — Inpatient Hospital Stay: Payer: PPO | Admitting: Anesthesiology

## 2022-03-24 ENCOUNTER — Encounter
Admit: 2022-03-24 | Discharge: 2022-03-24 | Disposition: A | Discharge status: Home / Self Care | Payer: PPO | Attending: Psychiatry | Admitting: Psychiatry

## 2022-03-24 DIAGNOSIS — G8929 Other chronic pain: Secondary | ICD-10-CM | POA: Diagnosis not present

## 2022-03-24 DIAGNOSIS — F332 Major depressive disorder, recurrent severe without psychotic features: Secondary | ICD-10-CM

## 2022-03-24 DIAGNOSIS — F419 Anxiety disorder, unspecified: Secondary | ICD-10-CM | POA: Diagnosis not present

## 2022-03-24 DIAGNOSIS — R200 Anesthesia of skin: Secondary | ICD-10-CM | POA: Insufficient documentation

## 2022-03-24 DIAGNOSIS — F319 Bipolar disorder, unspecified: Secondary | ICD-10-CM | POA: Insufficient documentation

## 2022-03-24 DIAGNOSIS — R45851 Suicidal ideations: Secondary | ICD-10-CM | POA: Insufficient documentation

## 2022-03-24 MED ORDER — SODIUM CHLORIDE 0.9 % IV SOLN: INTRAVENOUS | Status: DC | PRN

## 2022-03-24 MED ORDER — SODIUM CHLORIDE 0.9 % IV SOLN
500.0000 mL | Freq: Once | INTRAVENOUS | Status: AC
  Administered 2022-03-24: 500 mL via INTRAVENOUS

## 2022-03-24 MED ORDER — GLYCOPYRROLATE 0.2 MG/ML IJ SOLN
INTRAMUSCULAR | Status: AC
  Administered 2022-03-24: 0.1 mg via INTRAVENOUS
  Filled 2022-03-24: qty 1

## 2022-03-24 MED ORDER — METHOHEXITAL SODIUM 100 MG/10ML IV SOSY
PREFILLED_SYRINGE | INTRAVENOUS | Status: DC | PRN
  Administered 2022-03-24: 70 mg via INTRAVENOUS

## 2022-03-24 MED ORDER — MIDAZOLAM HCL 2 MG/2ML IJ SOLN: 2.0000 mg | Freq: Once | INTRAMUSCULAR | Status: DC

## 2022-03-24 MED ORDER — METHOHEXITAL SODIUM 0.5 G IJ SOLR
INTRAMUSCULAR | Status: AC
  Filled 2022-03-24: qty 500

## 2022-03-24 MED ORDER — ONDANSETRON HCL 4 MG/2ML IJ SOLN
4.0000 mg | Freq: Once | INTRAMUSCULAR | Status: AC
  Administered 2022-03-24: 4 mg via INTRAVENOUS

## 2022-03-24 MED ORDER — ONDANSETRON HCL 4 MG/2ML IJ SOLN
INTRAMUSCULAR | Status: AC
  Filled 2022-03-24: qty 2

## 2022-03-24 MED ORDER — GLYCOPYRROLATE 0.2 MG/ML IJ SOLN: 0.1000 mg | Freq: Once | INTRAMUSCULAR | Status: AC

## 2022-03-24 MED ORDER — SUCCINYLCHOLINE CHLORIDE 200 MG/10ML IV SOSY
PREFILLED_SYRINGE | INTRAVENOUS | Status: DC | PRN
  Administered 2022-03-24: 80 mg via INTRAVENOUS

## 2022-03-24 MED ORDER — KETOROLAC TROMETHAMINE 30 MG/ML IJ SOLN
INTRAMUSCULAR | Status: AC
  Administered 2022-03-24: 30 mg via INTRAVENOUS
  Filled 2022-03-24: qty 1

## 2022-03-24 MED ORDER — KETOROLAC TROMETHAMINE 30 MG/ML IJ SOLN: 30.0000 mg | Freq: Once | INTRAMUSCULAR | Status: AC

## 2022-03-24 NOTE — Anesthesia Preprocedure Evaluation (Signed)
Anesthesia Evaluation  Patient identified by MRN, date of birth, ID band Patient awake    Reviewed: Allergy & Precautions, NPO status , Patient's Chart, lab work & pertinent test results  History of Anesthesia Complications (+) PONV and history of anesthetic complications  Airway Mallampati: III  TM Distance: >3 FB Neck ROM: full    Dental  (+) Chipped   Pulmonary neg shortness of breath, former smoker,    Pulmonary exam normal        Cardiovascular Exercise Tolerance: Good (-) anginanegative cardio ROS Normal cardiovascular exam     Neuro/Psych  Headaches, PSYCHIATRIC DISORDERS    GI/Hepatic Neg liver ROS, GERD  Controlled,  Endo/Other  negative endocrine ROS  Renal/GU negative Renal ROS  negative genitourinary   Musculoskeletal   Abdominal   Peds  Hematology negative hematology ROS (+)   Anesthesia Other Findings Past Medical History: No date: Anxiety No date: Anxiety No date: Bipolar 1 disorder (HCC) No date: Chiari malformation No date: Complication of anesthesia No date: Depression No date: GERD (gastroesophageal reflux disease) No date: MDD (major depressive disorder) No date: Microscopic colitis No date: Migraine headache No date: PONV (postoperative nausea and vomiting)  Past Surgical History: No date: APPENDECTOMY No date: CHOLECYSTECTOMY 10/28/2021: INCISION AND DRAINAGE OF WOUND; Left     Comment:  Procedure: KNEE ARTHROSCOPY WITH IRRIGATION AND               DEBRIDEMENT;  Surgeon: Collins, Robert, MD;  Location: [facility]              ORS;  Service: Orthopedics;  Laterality: Left;  60 okay               per OR No date: KNEE SURGERY; Left     Comment:  3 times No date: MANDIBLE SURGERY  BMI    Body Mass Index: 24.98 kg/m      Reproductive/Obstetrics negative OB ROS                             Anesthesia Physical  Anesthesia Plan  ASA: 2  Anesthesia Plan:  General   Post-op Pain Management:    Induction: Intravenous  PONV Risk Score and Plan: Propofol infusion and TIVA  Airway Management Planned: Mask  Additional Equipment:   Intra-op Plan:   Post-operative Plan:   Informed Consent: I have reviewed the patients History and Physical, chart, labs and discussed the procedure including the risks, benefits and alternatives for the proposed anesthesia with the patient or authorized representative who has indicated his/her understanding and acceptance.     Dental Advisory Given  Plan Discussed with: Anesthesiologist, CRNA and Surgeon  Anesthesia Plan Comments: (Patient consented for risks of anesthesia including but not limited to:  - adverse reactions to medications - risk of airway placement if required - damage to eyes, teeth, lips or other oral mucosa - nerve damage due to positioning  - sore throat or hoarseness - Damage to heart, brain, nerves, lungs, other parts of body or loss of life  Patient voiced understanding.)        Anesthesia Quick Evaluation  

## 2022-03-24 NOTE — Transfer of Care (Signed)
Immediate Anesthesia Transfer of Care Note  Patient: Kayla Holden  Procedure(s) Performed: ECT TX  Patient Location: PACU  Anesthesia Type:General  Level of Consciousness: awake, alert  and drowsy  Airway & Oxygen Therapy: Patient Spontanous Breathing and Patient connected to face mask oxygen  Post-op Assessment: Report given to RN and Post -op Vital signs reviewed and stable  Post vital signs: Reviewed and stable  Last Vitals:  Vitals Value Taken Time  BP 123/83 03/24/22 1312  Temp    Pulse 85 03/24/22 1312  Resp 19 03/24/22 1312  SpO2 98 % 03/24/22 1312  Vitals shown include unvalidated device data.  Last Pain:  Vitals:   03/24/22 1236  TempSrc:   PainSc: 0-No pain         Complications: No notable events documented.

## 2022-03-24 NOTE — Procedures (Signed)
ECT SERVICES Physician's Interval Evaluation & Treatment Note  Patient Identification: Kayla Holden MRN:  [id number] Date of Evaluation:  03/24/2022 TX #: 3  MADRS:   MMSE:   P.E. Findings:  No change to physical exam  Psychiatric Interval Note:  Reports depression and dysphoria  Subjective:  Patient is a 41 y.o. female seen for evaluation for Electroconvulsive Therapy. Subjectively depressed  Treatment Summary:   []   Right Unilateral             [x]  Bilateral   % Energy : 1.0 ms 40%   Impedance: 1860 ohms  Seizure Energy Index: 19,364 V squared  Postictal Suppression Index: 84%  Seizure Concordance Index: 98%  Medications  Pre Shock: Brevital 70 mg succinylcholine 80 mg  Post Shock: Versed 2 mg  Seizure Duration: EMG 42 seconds EEG 50 seconds   Comments: Follow-up Wednesday  Lungs:  [x]   Clear to auscultation               []  Other:   Heart:    [x]   Regular rhythm             []  irregular rhythm    [x]   Previous H&P reviewed, patient examined and there are NO CHANGES                 []   Previous H&P reviewed, patient examined and there are changes noted.   , MD 8/7/20236:28 PM

## 2022-03-24 NOTE — Anesthesia Postprocedure Evaluation (Signed)
Anesthesia Post Note  Patient: Kayla Holden  Procedure(s) Performed: ECT TX  Patient location during evaluation: PACU Anesthesia Type: General Level of consciousness: awake and alert Pain management: pain level controlled Vital Signs Assessment: post-procedure vital signs reviewed and stable Respiratory status: spontaneous breathing, nonlabored ventilation, respiratory function stable and patient connected to nasal cannula oxygen Cardiovascular status: blood pressure returned to baseline and stable Postop Assessment: no apparent nausea or vomiting Anesthetic complications: no   No notable events documented.   Last Vitals:  Vitals:   03/24/22 1330 03/24/22 1337  BP: 125/87   Pulse: 74 75  Resp: 11 13  Temp:  36.4 C  SpO2: 99% 99%    Last Pain:  Vitals:   03/24/22 1330  TempSrc:   PainSc: 0-No pain                 Cleda Mccreedy Aaradhya Kysar

## 2022-03-24 NOTE — H&P (Signed)
Kayla Holden is an 41 y.o. female.   Chief Complaint: Ongoing symptoms of depression and anxiety HPI: Recurrent depression as part of bipolar disorder  Past Medical History:  Diagnosis Date   Anxiety    Anxiety    Bipolar 1 disorder (HCC)    Chiari malformation    Complication of anesthesia    Depression    GERD (gastroesophageal reflux disease)    MDD (major depressive disorder)    Microscopic colitis    Migraine headache    PONV (postoperative nausea and vomiting)     Past Surgical History:  Procedure Laterality Date   APPENDECTOMY     CHOLECYSTECTOMY     INCISION AND DRAINAGE OF WOUND Left 10/28/2021   Procedure: KNEE ARTHROSCOPY WITH IRRIGATION AND DEBRIDEMENT;  Surgeon: Eugenia Mcalpine, MD;  Location: [facility] ORS;  Service: Orthopedics;  Laterality: Left;  60 okay per OR   KNEE SURGERY Left    3 times   MANDIBLE SURGERY      Family History  Problem Relation Age of Onset   Cancer Maternal Grandmother    Cancer Paternal Grandfather    Allergic rhinitis Neg Hx    Angioedema Neg Hx    Asthma Neg Hx    Atopy Neg Hx    Eczema Neg Hx    Urticaria Neg Hx    Immunodeficiency Neg Hx    Social History:  reports that she has quit smoking. Her smoking use included cigarettes. She has never used smokeless tobacco. She reports current drug use. Drug: Marijuana. She reports that she does not drink alcohol.  Allergies:  Allergies  Allergen Reactions   Codeine Other (See Comments)    Patient has "CYP2D6," so NO CODEINE   Fioricet-Codeine [Butalbital-Apap-Caff-Cod] Other (See Comments)    Patient has "CYP2D6" and cannot tolerate codeine, palpitations, rapid heart rate   Isoniazid Shortness Of Breath   Ketorolac Tromethamine Other (See Comments)    Abdominal Pain if given PO. It has to be given IV   Rifampin Shortness Of Breath and Hives   Tape Other (See Comments)    Paper tape causes blisters    Clindamycin Diarrhea and Other (See Comments)    Abdominal pain, also    Ketoprofen Other (See Comments)    Abdominal pain and cannot take due to history of colitis   Nsaids Other (See Comments)    History of colitis    (Not in a hospital admission)   No results found for this or any previous visit (from the past 48 hour(s)). No results found.  Review of Systems  Constitutional: Negative.   HENT: Negative.    Eyes: Negative.   Respiratory: Negative.    Cardiovascular: Negative.   Gastrointestinal: Negative.   Musculoskeletal: Negative.   Skin: Negative.   Neurological: Negative.   Psychiatric/Behavioral:  Positive for dysphoric mood.     Blood pressure 124/85, pulse 70, temperature (!) 97.5 F (36.4 C), temperature source Oral, resp. rate 14, height 5\' 3"  (1.6 m), weight 64 kg, SpO2 99 %. Physical Exam Vitals and nursing note reviewed.  Constitutional:      Appearance: She is well-developed.  HENT:     Head: Normocephalic and atraumatic.  Eyes:     Conjunctiva/sclera: Conjunctivae normal.     Pupils: Pupils are equal, round, and reactive to light.  Cardiovascular:     Heart sounds: Normal heart sounds.  Pulmonary:     Effort: Pulmonary effort is normal.  Abdominal:     Palpations: Abdomen is soft.  Musculoskeletal:        General: Normal range of motion.     Cervical back: Normal range of motion.  Skin:    General: Skin is warm and dry.  Neurological:     General: No focal deficit present.     Mental Status: She is alert.  Psychiatric:        Attention and Perception: Attention normal.        Mood and Affect: Mood is depressed.        Speech: Speech normal.        Behavior: Behavior is cooperative.      Assessment/Plan Patient will continue with bilateral treatment next treatment today and continue on schedule through week with ongoing assessment of improvement  Mordecai Rasmussen, MD 03/24/2022, 6:18 PM

## 2022-03-24 NOTE — Progress Notes (Signed)
Education provided to the patient regarding the possibility of incontinence due to the administration of anesthesia. Recommendation was made for the patient to apply an incontinence pad to prevent soiling clothing. Patient verbalized understanding of the education and stated they choose not to apply and incontinence pad for this visit.   

## 2022-03-24 NOTE — Anesthesia Procedure Notes (Signed)
Date/Time: 03/24/2022 1:00 PM  Performed by: Ginger Carne, CRNAPre-anesthesia Checklist: Patient identified, Emergency Drugs available, Suction available, Patient being monitored and Timeout performed Patient Re-evaluated:Patient Re-evaluated prior to induction Oxygen Delivery Method: Circle system utilized Preoxygenation: Pre-oxygenation with 100% oxygen Induction Type: IV induction Ventilation: Mask ventilation without difficulty

## 2022-03-25 ENCOUNTER — Other Ambulatory Visit: Payer: Self-pay | Admitting: Psychiatry

## 2022-03-25 DIAGNOSIS — R200 Anesthesia of skin: Secondary | ICD-10-CM

## 2022-03-26 ENCOUNTER — Encounter
Admit: 2022-03-26 | Discharge: 2022-03-26 | Disposition: A | Discharge status: Home / Self Care | Payer: PPO | Attending: Psychiatry | Admitting: Psychiatry

## 2022-03-26 ENCOUNTER — Ambulatory Visit: Payer: Self-pay | Admitting: Anesthesiology

## 2022-03-26 ENCOUNTER — Encounter: Payer: Self-pay | Admitting: Anesthesiology

## 2022-03-26 DIAGNOSIS — R200 Anesthesia of skin: Secondary | ICD-10-CM

## 2022-03-26 DIAGNOSIS — G8929 Other chronic pain: Secondary | ICD-10-CM | POA: Diagnosis not present

## 2022-03-26 DIAGNOSIS — F319 Bipolar disorder, unspecified: Secondary | ICD-10-CM | POA: Diagnosis not present

## 2022-03-26 MED ORDER — KETOROLAC TROMETHAMINE 30 MG/ML IJ SOLN
INTRAMUSCULAR | Status: AC
  Administered 2022-03-26: 30 mg via INTRAVENOUS
  Filled 2022-03-26: qty 1

## 2022-03-26 MED ORDER — SUCCINYLCHOLINE CHLORIDE 200 MG/10ML IV SOSY
PREFILLED_SYRINGE | INTRAVENOUS | Status: DC | PRN
  Administered 2022-03-26: 80 mg via INTRAVENOUS

## 2022-03-26 MED ORDER — METHOHEXITAL SODIUM 100 MG/10ML IV SOSY
PREFILLED_SYRINGE | INTRAVENOUS | Status: DC | PRN
  Administered 2022-03-26: 70 mg via INTRAVENOUS

## 2022-03-26 MED ORDER — KETOROLAC TROMETHAMINE 30 MG/ML IJ SOLN: 30.0000 mg | Freq: Once | INTRAMUSCULAR | Status: AC

## 2022-03-26 MED ORDER — SODIUM CHLORIDE 0.9 % IV SOLN
500.0000 mL | Freq: Once | INTRAVENOUS | Status: AC
  Administered 2022-03-26: 500 mL via INTRAVENOUS

## 2022-03-26 MED ORDER — SODIUM CHLORIDE 0.9 % IV SOLN: INTRAVENOUS | Status: DC | PRN

## 2022-03-26 NOTE — Transfer of Care (Signed)
Immediate Anesthesia Transfer of Care Note  Patient: Kayla Holden  Procedure(s) Performed: ECT TX  Patient Location: PACU  Anesthesia Type:General  Level of Consciousness: drowsy  Airway & Oxygen Therapy: Patient Spontanous Breathing  Post-op Assessment: Report given to RN and Post -op Vital signs reviewed and stable  Post vital signs: Reviewed and stable  Last Vitals:  Vitals Value Taken Time  BP 126/86 03/26/22 1232  Temp 37.1 C 03/26/22 1232  Pulse 80 03/26/22 1233  Resp 16 03/26/22 1233  SpO2 99 % 03/26/22 1233  Vitals shown include unvalidated device data.  Last Pain:  Vitals:   03/26/22 1232  TempSrc:   PainSc: Asleep         Complications: No notable events documented.

## 2022-03-26 NOTE — Anesthesia Postprocedure Evaluation (Signed)
Anesthesia Post Note  Patient: Kayla Holden  Procedure(s) Performed: ECT TX  Patient location during evaluation: PACU Anesthesia Type: General Level of consciousness: awake and alert Pain management: pain level controlled Vital Signs Assessment: post-procedure vital signs reviewed and stable Respiratory status: spontaneous breathing, nonlabored ventilation, respiratory function stable and patient connected to nasal cannula oxygen Cardiovascular status: blood pressure returned to baseline and stable Postop Assessment: no apparent nausea or vomiting Anesthetic complications: no   No notable events documented.   Last Vitals:  Vitals:   03/26/22 1247 03/26/22 1310  BP: (!) 128/92 (!) 131/94  Pulse: 75 70  Resp: 11 16  Temp: 37.1 C 36.9 C  SpO2: 97%     Last Pain:  Vitals:   03/26/22 1310  TempSrc: Oral  PainSc: 0-No pain                 Cleda Mccreedy Annell Canty

## 2022-03-26 NOTE — Progress Notes (Signed)
Education provided to the patient regarding the possibility of incontinence due to the administration of anesthesia. Recommendation was made for the patient to apply an incontinence pad to prevent soiling clothing. Patient verbalized understanding of the education and stated they choose not to apply and incontinence pad for this visit.   

## 2022-03-26 NOTE — Anesthesia Preprocedure Evaluation (Signed)
Anesthesia Evaluation  Patient identified by MRN, date of birth, ID band Patient awake    Reviewed: Allergy & Precautions, NPO status , Patient's Chart, lab work & pertinent test results  History of Anesthesia Complications (+) PONV and history of anesthetic complications  Airway Mallampati: III  TM Distance: >3 FB Neck ROM: full    Dental  (+) Chipped   Pulmonary neg shortness of breath, former smoker,    Pulmonary exam normal        Cardiovascular Exercise Tolerance: Good (-) anginanegative cardio ROS Normal cardiovascular exam     Neuro/Psych  Headaches, PSYCHIATRIC DISORDERS    GI/Hepatic Neg liver ROS, GERD  Controlled,  Endo/Other  negative endocrine ROS  Renal/GU negative Renal ROS  negative genitourinary   Musculoskeletal   Abdominal   Peds  Hematology negative hematology ROS (+)   Anesthesia Other Findings Past Medical History: No date: Anxiety No date: Anxiety No date: Bipolar 1 disorder (HCC) No date: Chiari malformation No date: Complication of anesthesia No date: Depression No date: GERD (gastroesophageal reflux disease) No date: MDD (major depressive disorder) No date: Microscopic colitis No date: Migraine headache No date: PONV (postoperative nausea and vomiting)  Past Surgical History: No date: APPENDECTOMY No date: CHOLECYSTECTOMY 10/28/2021: INCISION AND DRAINAGE OF WOUND; Left     Comment:  Procedure: KNEE ARTHROSCOPY WITH IRRIGATION AND               DEBRIDEMENT;  Surgeon: Collins, Robert, MD;  Location: [facility]              ORS;  Service: Orthopedics;  Laterality: Left;  60 okay               per OR No date: KNEE SURGERY; Left     Comment:  3 times No date: MANDIBLE SURGERY  BMI    Body Mass Index: 24.98 kg/m      Reproductive/Obstetrics negative OB ROS                             Anesthesia Physical  Anesthesia Plan  ASA: 2  Anesthesia Plan:  General   Post-op Pain Management:    Induction: Intravenous  PONV Risk Score and Plan: Propofol infusion and TIVA  Airway Management Planned: Mask  Additional Equipment:   Intra-op Plan:   Post-operative Plan:   Informed Consent: I have reviewed the patients History and Physical, chart, labs and discussed the procedure including the risks, benefits and alternatives for the proposed anesthesia with the patient or authorized representative who has indicated his/her understanding and acceptance.     Dental Advisory Given  Plan Discussed with: Anesthesiologist, CRNA and Surgeon  Anesthesia Plan Comments: (Patient consented for risks of anesthesia including but not limited to:  - adverse reactions to medications - risk of airway placement if required - damage to eyes, teeth, lips or other oral mucosa - nerve damage due to positioning  - sore throat or hoarseness - Damage to heart, brain, nerves, lungs, other parts of body or loss of life  Patient voiced understanding.)        Anesthesia Quick Evaluation  

## 2022-03-27 ENCOUNTER — Other Ambulatory Visit: Payer: Self-pay | Admitting: Psychiatry

## 2022-03-27 DIAGNOSIS — R200 Anesthesia of skin: Secondary | ICD-10-CM

## 2022-03-28 ENCOUNTER — Encounter (HOSPITAL_BASED_OUTPATIENT_CLINIC_OR_DEPARTMENT_OTHER)
Admit: 2022-03-28 | Discharge: 2022-03-28 | Disposition: A | Discharge status: Home / Self Care | Payer: PPO | Attending: Psychiatry | Admitting: Psychiatry

## 2022-03-28 ENCOUNTER — Ambulatory Visit: Payer: Self-pay | Admitting: Certified Registered"

## 2022-03-28 ENCOUNTER — Encounter: Payer: Self-pay | Admitting: Certified Registered"

## 2022-03-28 DIAGNOSIS — F319 Bipolar disorder, unspecified: Secondary | ICD-10-CM | POA: Diagnosis not present

## 2022-03-28 DIAGNOSIS — F332 Major depressive disorder, recurrent severe without psychotic features: Secondary | ICD-10-CM

## 2022-03-28 DIAGNOSIS — F418 Other specified anxiety disorders: Secondary | ICD-10-CM | POA: Diagnosis not present

## 2022-03-28 DIAGNOSIS — R200 Anesthesia of skin: Secondary | ICD-10-CM

## 2022-03-28 MED ORDER — KETOROLAC TROMETHAMINE 30 MG/ML IJ SOLN
INTRAMUSCULAR | Status: AC
  Filled 2022-03-28: qty 1

## 2022-03-28 MED ORDER — ONDANSETRON HCL 4 MG/2ML IJ SOLN
INTRAMUSCULAR | Status: AC
  Filled 2022-03-28: qty 2

## 2022-03-28 MED ORDER — SODIUM CHLORIDE 0.9 % IV SOLN
500.0000 mL | Freq: Once | INTRAVENOUS | Status: AC
  Administered 2022-03-28: 500 mL via INTRAVENOUS

## 2022-03-28 MED ORDER — KETOROLAC TROMETHAMINE 30 MG/ML IJ SOLN
30.0000 mg | Freq: Once | INTRAMUSCULAR | Status: AC
  Administered 2022-03-28: 30 mg via INTRAVENOUS

## 2022-03-28 MED ORDER — ONDANSETRON HCL 4 MG/2ML IJ SOLN
4.0000 mg | Freq: Once | INTRAMUSCULAR | Status: AC
  Administered 2022-03-28: 4 mg via INTRAVENOUS

## 2022-03-28 MED ORDER — MIDAZOLAM HCL 2 MG/2ML IJ SOLN
INTRAMUSCULAR | Status: DC | PRN
  Administered 2022-03-28: 2 mg via INTRAVENOUS

## 2022-03-28 MED ORDER — MIDAZOLAM HCL 2 MG/2ML IJ SOLN
INTRAMUSCULAR | Status: AC
  Filled 2022-03-28: qty 2

## 2022-03-28 MED ORDER — GLYCOPYRROLATE 0.2 MG/ML IJ SOLN
INTRAMUSCULAR | Status: AC
  Filled 2022-03-28: qty 1

## 2022-03-28 MED ORDER — LACTATED RINGERS IV SOLN: INTRAVENOUS | Status: DC | PRN

## 2022-03-28 MED ORDER — GLYCOPYRROLATE 0.2 MG/ML IJ SOLN
0.1000 mg | Freq: Once | INTRAMUSCULAR | Status: AC
  Administered 2022-03-28: 0.1 mg via INTRAVENOUS

## 2022-03-28 MED ORDER — SUCCINYLCHOLINE CHLORIDE 200 MG/10ML IV SOSY
PREFILLED_SYRINGE | INTRAVENOUS | Status: DC | PRN
  Administered 2022-03-28: 80 mg via INTRAVENOUS

## 2022-03-28 MED ORDER — METHOHEXITAL SODIUM 100 MG/10ML IV SOSY
PREFILLED_SYRINGE | INTRAVENOUS | Status: DC | PRN
  Administered 2022-03-28: 70 mg via INTRAVENOUS

## 2022-03-28 NOTE — Transfer of Care (Signed)
Immediate Anesthesia Transfer of Care Note  Patient: Kayla Holden  Procedure(s) Performed: ECT TX  Patient Location: PACU  Anesthesia Type:General  Level of Consciousness: awake and alert   Airway & Oxygen Therapy: Patient Spontanous Breathing and Patient connected to nasal cannula oxygen  Post-op Assessment: Report given to RN and Post -op Vital signs reviewed and stable  Post vital signs: Reviewed and stable  Last Vitals:  Vitals Value Taken Time  BP 130/83 03/28/22 1204  Temp 37 C 03/28/22 1204  Pulse 86 03/28/22 1207  Resp 21 03/28/22 1207  SpO2 97 % 03/28/22 1207  Vitals shown include unvalidated device data.  Last Pain:  Vitals:   03/28/22 1204  TempSrc:   PainSc: Asleep         Complications: No notable events documented.

## 2022-03-28 NOTE — Procedures (Signed)
ECT SERVICES Physician's Interval Evaluation & Treatment Note  Patient Identification: Kayla Holden MRN:  [id number] Date of Evaluation:  03/28/2022 TX #: 5  MADRS:   MMSE:   P.E. Findings:  No change to physical exam  Psychiatric Interval Note:  No change continues to endorse depression  Subjective:  Patient is a 41 y.o. female seen for evaluation for Electroconvulsive Therapy. Depressed mood passive suicidal thoughts  Treatment Summary:   []   Right Unilateral             [x]  Bilateral   % Energy : 1.0 ms 40%   Impedance: 2380 ohms  Seizure Energy Index: 19,822 V squared  Postictal Suppression Index: 87%  Seizure Concordance Index: 96%  Medications  Pre Shock: Brevital 70 mg succinylcholine 80 mg Toradol 30 mg  Post Shock: Versed 2 mg  Seizure Duration: 23 seconds EMG 50 seconds EEG   Comments: Continue treatment into next week  Lungs:  [x]   Clear to auscultation               []  Other:   Heart:    [x]   Regular rhythm             []  irregular rhythm    [x]   Previous H&P reviewed, patient examined and there are NO CHANGES                 []   Previous H&P reviewed, patient examined and there are changes noted.   , MD 8/11/20234:52 PM

## 2022-03-28 NOTE — Anesthesia Preprocedure Evaluation (Signed)
Anesthesia Evaluation  Patient identified by MRN, date of birth, ID band Patient awake    Reviewed: Allergy & Precautions, NPO status , Patient's Chart, lab work & pertinent test results  History of Anesthesia Complications (+) PONV and history of anesthetic complications  Airway Mallampati: III  TM Distance: >3 FB Neck ROM: full    Dental  (+) Teeth Intact   Pulmonary neg shortness of breath, former smoker,    Pulmonary exam normal        Cardiovascular Exercise Tolerance: Good (-) anginanegative cardio ROS Normal cardiovascular exam     Neuro/Psych  Headaches, PSYCHIATRIC DISORDERS    GI/Hepatic Neg liver ROS, GERD  Controlled,  Endo/Other  negative endocrine ROS  Renal/GU negative Renal ROS  negative genitourinary   Musculoskeletal   Abdominal   Peds  Hematology negative hematology ROS (+)   Anesthesia Other Findings Past Medical History: No date: Anxiety No date: Anxiety No date: Bipolar 1 disorder (HCC) No date: Chiari malformation No date: Complication of anesthesia No date: Depression No date: GERD (gastroesophageal reflux disease) No date: MDD (major depressive disorder) No date: Microscopic colitis No date: Migraine headache No date: PONV (postoperative nausea and vomiting)  Past Surgical History: No date: APPENDECTOMY No date: CHOLECYSTECTOMY 10/28/2021: INCISION AND DRAINAGE OF WOUND; Left     Comment:  Procedure: KNEE ARTHROSCOPY WITH IRRIGATION AND               DEBRIDEMENT;  Surgeon: Eugenia Mcalpine, MD;  Location: [facility]              ORS;  Service: Orthopedics;  Laterality: Left;  60 okay               per OR No date: KNEE SURGERY; Left     Comment:  3 times No date: MANDIBLE SURGERY  BMI    Body Mass Index: 24.98 kg/m      Reproductive/Obstetrics negative OB ROS                             Anesthesia Physical  Anesthesia Plan  ASA: 2  Anesthesia Plan:  General   Post-op Pain Management:    Induction: Intravenous  PONV Risk Score and Plan: Propofol infusion and TIVA  Airway Management Planned: Mask  Additional Equipment:   Intra-op Plan:   Post-operative Plan:   Informed Consent: I have reviewed the patients History and Physical, chart, labs and discussed the procedure including the risks, benefits and alternatives for the proposed anesthesia with the patient or authorized representative who has indicated his/her understanding and acceptance.     Dental Advisory Given  Plan Discussed with: Anesthesiologist, CRNA and Surgeon  Anesthesia Plan Comments: (Patient consented for risks of anesthesia including but not limited to:  - adverse reactions to medications - risk of airway placement if required - damage to eyes, teeth, lips or other oral mucosa - nerve damage due to positioning  - sore throat or hoarseness - Damage to heart, brain, nerves, lungs, other parts of body or loss of life  Patient voiced understanding.)        Anesthesia Quick Evaluation

## 2022-03-28 NOTE — H&P (Signed)
Kayla Holden is an 41 y.o. female.   Chief Complaint: Patient continues to complain of depressed mood passive suicidal ideation and does not feel she is improved HPI: History of recurrent severe depression  Past Medical History:  Diagnosis Date   Anxiety    Anxiety    Bipolar 1 disorder (HCC)    Chiari malformation    Complication of anesthesia    Depression    GERD (gastroesophageal reflux disease)    MDD (major depressive disorder)    Microscopic colitis    Migraine headache    PONV (postoperative nausea and vomiting)     Past Surgical History:  Procedure Laterality Date   APPENDECTOMY     CHOLECYSTECTOMY     INCISION AND DRAINAGE OF WOUND Left 10/28/2021   Procedure: KNEE ARTHROSCOPY WITH IRRIGATION AND DEBRIDEMENT;  Surgeon: Eugenia Mcalpine, MD;  Location: [facility];  Service: Orthopedics;  Laterality: Left;  60 okay per OR   KNEE SURGERY Left    3 times   MANDIBLE SURGERY      Family History  Problem Relation Age of Onset   Cancer Maternal Grandmother    Cancer Paternal Grandfather    Allergic rhinitis Neg Hx    Angioedema Neg Hx    Asthma Neg Hx    Atopy Neg Hx    Eczema Neg Hx    Urticaria Neg Hx    Immunodeficiency Neg Hx    Social History:  reports that she has quit smoking. Her smoking use included cigarettes. She has never used smokeless tobacco. She reports current drug use. Drug: Marijuana. She reports that she does not drink alcohol.  Allergies:  Allergies  Allergen Reactions   Codeine Other (See Comments)    Patient has "CYP2D6," so NO CODEINE   Fioricet-Codeine [Butalbital-Apap-Caff-Cod] Other (See Comments)    Patient has "CYP2D6" and cannot tolerate codeine, palpitations, rapid heart rate   Isoniazid Shortness Of Breath   Ketorolac Tromethamine Other (See Comments)    Abdominal Pain if given PO. It has to be given IV   Rifampin Shortness Of Breath and Hives   Tape Other (See Comments)    Paper tape causes blisters    Clindamycin Diarrhea  and Other (See Comments)    Abdominal pain, also   Ketoprofen Other (See Comments)    Abdominal pain and cannot take due to history of colitis   Nsaids Other (See Comments)    History of colitis    (Not in a hospital admission)   No results found for this or any previous visit (from the past 48 hour(s)). No results found.  Review of Systems  Constitutional: Negative.   HENT: Negative.    Eyes: Negative.   Respiratory: Negative.    Cardiovascular: Negative.   Gastrointestinal: Negative.   Musculoskeletal: Negative.   Skin: Negative.   Neurological: Negative.   Psychiatric/Behavioral:  Positive for dysphoric mood.   All other systems reviewed and are negative.   Blood pressure 108/69, pulse 82, temperature 98.7 F (37.1 C), temperature source Axillary, resp. rate 16, height 5\' 3"  (1.6 m), weight 64 kg, SpO2 95 %. Physical Exam Vitals and nursing note reviewed.  Constitutional:      Appearance: She is well-developed.  HENT:     Head: Normocephalic and atraumatic.  Eyes:     Conjunctiva/sclera: Conjunctivae normal.     Pupils: Pupils are equal, round, and reactive to light.  Cardiovascular:     Heart sounds: Normal heart sounds.  Pulmonary:  Effort: Pulmonary effort is normal.  Abdominal:     Palpations: Abdomen is soft.  Musculoskeletal:        General: Normal range of motion.     Cervical back: Normal range of motion.  Skin:    General: Skin is warm and dry.  Neurological:     Mental Status: She is alert.  Psychiatric:        Attention and Perception: Attention normal.        Mood and Affect: Mood is depressed.        Speech: Speech is delayed.      Assessment/Plan Treatment continues today and in the next week.  Encouraged her to stay active on days in between treatments  Mordecai Rasmussen, MD 03/28/2022, 4:51 PM

## 2022-03-29 NOTE — Anesthesia Postprocedure Evaluation (Signed)
Anesthesia Post Note  Patient: Kayla Holden  Procedure(s) Performed: ECT TX  Patient location during evaluation: PACU Anesthesia Type: General Level of consciousness: awake and alert Pain management: pain level controlled Vital Signs Assessment: post-procedure vital signs reviewed and stable Respiratory status: spontaneous breathing, nonlabored ventilation, respiratory function stable and patient connected to nasal cannula oxygen Cardiovascular status: blood pressure returned to baseline and stable Postop Assessment: no apparent nausea or vomiting Anesthetic complications: no   No notable events documented.   Last Vitals:  Vitals:   03/28/22 1210 03/28/22 1218  BP: 123/70 108/69  Pulse: 93 82  Resp: 15 16  Temp:  37.1 C  SpO2: 95%     Last Pain:  Vitals:   03/28/22 1218  TempSrc: Axillary  PainSc: 0-No pain                 Stephanie Coup

## 2022-03-31 ENCOUNTER — Encounter (HOSPITAL_BASED_OUTPATIENT_CLINIC_OR_DEPARTMENT_OTHER)
Admit: 2022-03-31 | Discharge: 2022-03-31 | Disposition: A | Discharge status: Home / Self Care | Payer: PPO | Attending: Psychiatry | Admitting: Psychiatry

## 2022-03-31 ENCOUNTER — Encounter: Payer: Self-pay | Admitting: Certified Registered Nurse Anesthetist

## 2022-03-31 ENCOUNTER — Other Ambulatory Visit: Payer: Self-pay | Admitting: Psychiatry

## 2022-03-31 DIAGNOSIS — F332 Major depressive disorder, recurrent severe without psychotic features: Secondary | ICD-10-CM

## 2022-03-31 DIAGNOSIS — R200 Anesthesia of skin: Secondary | ICD-10-CM

## 2022-03-31 DIAGNOSIS — F319 Bipolar disorder, unspecified: Secondary | ICD-10-CM | POA: Diagnosis not present

## 2022-03-31 DIAGNOSIS — G8929 Other chronic pain: Secondary | ICD-10-CM | POA: Diagnosis not present

## 2022-03-31 LAB — POCT PREGNANCY, URINE: Preg Test, Ur: NEGATIVE

## 2022-03-31 MED ORDER — KETOROLAC TROMETHAMINE 30 MG/ML IJ SOLN
INTRAMUSCULAR | Status: AC
  Administered 2022-03-31: 30 mg via INTRAVENOUS
  Filled 2022-03-31: qty 1

## 2022-03-31 MED ORDER — ONDANSETRON HCL 4 MG/2ML IJ SOLN: 4.0000 mg | Freq: Once | INTRAMUSCULAR | Status: AC

## 2022-03-31 MED ORDER — ONDANSETRON HCL 4 MG/2ML IJ SOLN
INTRAMUSCULAR | Status: AC
  Administered 2022-03-31: 4 mg via INTRAVENOUS
  Filled 2022-03-31: qty 2

## 2022-03-31 MED ORDER — SUCCINYLCHOLINE CHLORIDE 200 MG/10ML IV SOSY
PREFILLED_SYRINGE | INTRAVENOUS | Status: DC | PRN
  Administered 2022-03-31: 80 mg via INTRAVENOUS

## 2022-03-31 MED ORDER — KETOROLAC TROMETHAMINE 30 MG/ML IJ SOLN: 30.0000 mg | Freq: Once | INTRAMUSCULAR | Status: AC

## 2022-03-31 MED ORDER — MIDAZOLAM HCL 2 MG/2ML IJ SOLN
INTRAMUSCULAR | Status: AC
  Filled 2022-03-31: qty 2

## 2022-03-31 MED ORDER — SODIUM CHLORIDE 0.9 % IV SOLN
500.0000 mL | Freq: Once | INTRAVENOUS | Status: AC
Start: 2022-03-31 — End: 2022-03-31
  Administered 2022-03-31: 500 mL via INTRAVENOUS

## 2022-03-31 MED ORDER — METHOHEXITAL SODIUM 100 MG/10ML IV SOSY
PREFILLED_SYRINGE | INTRAVENOUS | Status: DC | PRN
  Administered 2022-03-31: 70 mg via INTRAVENOUS

## 2022-03-31 MED ORDER — MIDAZOLAM HCL 2 MG/2ML IJ SOLN
2.0000 mg | Freq: Once | INTRAMUSCULAR | Status: AC
  Administered 2022-03-31: 2 mg via INTRAVENOUS

## 2022-03-31 MED ORDER — SODIUM CHLORIDE 0.9 % IV SOLN: INTRAVENOUS | Status: DC | PRN

## 2022-03-31 NOTE — Anesthesia Preprocedure Evaluation (Signed)
Anesthesia Evaluation  Patient identified by MRN, date of birth, ID band Patient awake    Reviewed: Allergy & Precautions, NPO status , Patient's Chart, lab work & pertinent test results  History of Anesthesia Complications (+) PONV and history of anesthetic complications  Airway Mallampati: III  TM Distance: >3 FB Neck ROM: full    Dental  (+) Chipped   Pulmonary neg shortness of breath, former smoker,    Pulmonary exam normal        Cardiovascular Exercise Tolerance: Good (-) anginanegative cardio ROS Normal cardiovascular exam     Neuro/Psych  Headaches, PSYCHIATRIC DISORDERS    GI/Hepatic Neg liver ROS, GERD  Controlled,  Endo/Other  negative endocrine ROS  Renal/GU negative Renal ROS  negative genitourinary   Musculoskeletal   Abdominal   Peds  Hematology negative hematology ROS (+)   Anesthesia Other Findings Past Medical History: No date: Anxiety No date: Anxiety No date: Bipolar 1 disorder (HCC) No date: Chiari malformation No date: Complication of anesthesia No date: Depression No date: GERD (gastroesophageal reflux disease) No date: MDD (major depressive disorder) No date: Microscopic colitis No date: Migraine headache No date: PONV (postoperative nausea and vomiting)  Past Surgical History: No date: APPENDECTOMY No date: CHOLECYSTECTOMY 10/28/2021: INCISION AND DRAINAGE OF WOUND; Left     Comment:  Procedure: KNEE ARTHROSCOPY WITH IRRIGATION AND               DEBRIDEMENT;  Surgeon: Collins, Robert, MD;  Location: [facility]              ORS;  Service: Orthopedics;  Laterality: Left;  60 okay               per OR No date: KNEE SURGERY; Left     Comment:  3 times No date: MANDIBLE SURGERY  BMI    Body Mass Index: 24.98 kg/m      Reproductive/Obstetrics negative OB ROS                             Anesthesia Physical  Anesthesia Plan  ASA: 2  Anesthesia Plan:  General   Post-op Pain Management:    Induction: Intravenous  PONV Risk Score and Plan: Propofol infusion and TIVA  Airway Management Planned: Mask  Additional Equipment:   Intra-op Plan:   Post-operative Plan:   Informed Consent: I have reviewed the patients History and Physical, chart, labs and discussed the procedure including the risks, benefits and alternatives for the proposed anesthesia with the patient or authorized representative who has indicated his/her understanding and acceptance.     Dental Advisory Given  Plan Discussed with: Anesthesiologist, CRNA and Surgeon  Anesthesia Plan Comments: (Patient consented for risks of anesthesia including but not limited to:  - adverse reactions to medications - risk of airway placement if required - damage to eyes, teeth, lips or other oral mucosa - nerve damage due to positioning  - sore throat or hoarseness - Damage to heart, brain, nerves, lungs, other parts of body or loss of life  Patient voiced understanding.)        Anesthesia Quick Evaluation  

## 2022-03-31 NOTE — Procedures (Signed)
ECT SERVICES Physician's Interval Evaluation & Treatment Note  Patient Identification: JANAYIA BURGGRAF MRN:  [id number] Date of Evaluation:  03/31/2022 TX #: 6  MADRS:   MMSE:   P.E. Findings:  No change to physical exam  Psychiatric Interval Note:  Patient notices no change.  Subjective:  Patient is a 41 y.o. female seen for evaluation for Electroconvulsive Therapy. Still feeling depressed and anxious  Treatment Summary:   []   Right Unilateral             [x]  Bilateral   % Energy : 1.0 ms 40%   Impedance: 1900 ohms  Seizure Energy Index: No reading  Postictal Suppression Index: No reading  Seizure Concordance Index: 94%  Medications  Pre Shock: Brevital 70 mg succinylcholine 80 mg  Post Shock:    Seizure Duration: 42 seconds EMG 43 seconds EEG   Comments: Appears to have been well-tolerated.  We will continue through this week open to finally see some clear improvement  Lungs:  [x]   Clear to auscultation               []  Other:   Heart:    [x]   Regular rhythm             []  irregular rhythm    [x]   Previous H&P reviewed, patient examined and there are NO CHANGES                 []   Previous H&P reviewed, patient examined and there are changes noted.   , MD 8/14/20235:38 PM

## 2022-03-31 NOTE — Anesthesia Procedure Notes (Signed)
Procedure Name: General with mask airway Date/Time: 03/31/2022 12:21 PM  Performed by: Ginger Carne, CRNAPre-anesthesia Checklist: Patient identified, Emergency Drugs available, Suction available, Patient being monitored and Timeout performed Patient Re-evaluated:Patient Re-evaluated prior to induction Oxygen Delivery Method: Circle system utilized Induction Type: IV induction

## 2022-03-31 NOTE — H&P (Signed)
Kayla Holden is an 41 y.o. female.   Chief Complaint: Patient remains depressed not feeling any improvement HPI: Recurrent severe depression and anxiety  Past Medical History:  Diagnosis Date   Anxiety    Anxiety    Bipolar 1 disorder (HCC)    Chiari malformation    Complication of anesthesia    Depression    GERD (gastroesophageal reflux disease)    MDD (major depressive disorder)    Microscopic colitis    Migraine headache    PONV (postoperative nausea and vomiting)     Past Surgical History:  Procedure Laterality Date   APPENDECTOMY     CHOLECYSTECTOMY     INCISION AND DRAINAGE OF WOUND Left 10/28/2021   Procedure: KNEE ARTHROSCOPY WITH IRRIGATION AND DEBRIDEMENT;  Surgeon: Eugenia Mcalpine, MD;  Location: [facility];  Service: Orthopedics;  Laterality: Left;  60 okay per OR   KNEE SURGERY Left    3 times   MANDIBLE SURGERY      Family History  Problem Relation Age of Onset   Cancer Maternal Grandmother    Cancer Paternal Grandfather    Allergic rhinitis Neg Hx    Angioedema Neg Hx    Asthma Neg Hx    Atopy Neg Hx    Eczema Neg Hx    Urticaria Neg Hx    Immunodeficiency Neg Hx    Social History:  reports that she has quit smoking. Her smoking use included cigarettes. She has never used smokeless tobacco. She reports current drug use. Drug: Marijuana. She reports that she does not drink alcohol.  Allergies:  Allergies  Allergen Reactions   Codeine Other (See Comments)    Patient has "CYP2D6," so NO CODEINE   Fioricet-Codeine [Butalbital-Apap-Caff-Cod] Other (See Comments)    Patient has "CYP2D6" and cannot tolerate codeine, palpitations, rapid heart rate   Isoniazid Shortness Of Breath   Ketorolac Tromethamine Other (See Comments)    Abdominal Pain if given PO. It has to be given IV   Rifampin Shortness Of Breath and Hives   Tape Other (See Comments)    Paper tape causes blisters    Clindamycin Diarrhea and Other (See Comments)    Abdominal pain, also    Ketoprofen Other (See Comments)    Abdominal pain and cannot take due to history of colitis   Nsaids Other (See Comments)    History of colitis    (Not in a hospital admission)   Results for orders placed or performed during the hospital encounter of 03/31/22 (from the past 48 hour(s))  Pregnancy, urine POC     Status: None   Collection Time: 03/31/22 10:43 AM  Result Value Ref Range   Preg Test, Ur NEGATIVE NEGATIVE    Comment:        THE SENSITIVITY OF THIS METHODOLOGY IS >24 mIU/mL    No results found.  Review of Systems  Constitutional: Negative.   HENT: Negative.    Eyes: Negative.   Respiratory: Negative.    Cardiovascular: Negative.   Gastrointestinal: Negative.   Musculoskeletal: Negative.   Skin: Negative.   Neurological: Negative.   Psychiatric/Behavioral:  Positive for dysphoric mood.     Blood pressure 120/80, pulse 70, temperature 97.9 F (36.6 C), temperature source Oral, resp. rate 16, height 5\' 3"  (1.6 m), SpO2 97 %. Physical Exam Vitals and nursing note reviewed.  Constitutional:      Appearance: She is well-developed.  HENT:     Head: Normocephalic and atraumatic.  Eyes:  Conjunctiva/sclera: Conjunctivae normal.     Pupils: Pupils are equal, round, and reactive to light.  Cardiovascular:     Heart sounds: Normal heart sounds.  Pulmonary:     Effort: Pulmonary effort is normal.  Abdominal:     Palpations: Abdomen is soft.  Musculoskeletal:        General: Normal range of motion.     Cervical back: Normal range of motion.  Skin:    General: Skin is warm and dry.  Neurological:     General: No focal deficit present.     Mental Status: She is alert.  Psychiatric:        Attention and Perception: Attention normal. She is attentive.        Mood and Affect: Mood is anxious.        Speech: Speech normal.        Behavior: Behavior normal.      Assessment/Plan Plan to continue treatment at least into this week looking for some  improvement  Mordecai Rasmussen, MD 03/31/2022, 5:27 PM

## 2022-03-31 NOTE — Transfer of Care (Signed)
Immediate Anesthesia Transfer of Care Note  Patient: Kayla Holden  Procedure(s) Performed: ECT TX  Patient Location: PACU  Anesthesia Type:General  Level of Consciousness: drowsy  Airway & Oxygen Therapy: Patient Spontanous Breathing and Patient connected to face mask oxygen  Post-op Assessment: Report given to RN and Post -op Vital signs reviewed and stable  Post vital signs: Reviewed and stable  Last Vitals:  Vitals Value Taken Time  BP 132/91 03/31/22 1238  Temp 37.2 C 03/31/22 1236  Pulse 86 03/31/22 1238  Resp 11 03/31/22 1238  SpO2 98 % 03/31/22 1238    Last Pain:  Vitals:   03/31/22 1112  TempSrc:   PainSc: 0-No pain         Complications: No notable events documented.

## 2022-03-31 NOTE — Anesthesia Postprocedure Evaluation (Signed)
Anesthesia Post Note  Patient: VENNELA JUTTE  Procedure(s) Performed: ECT TX  Patient location during evaluation: PACU Anesthesia Type: General Level of consciousness: awake and alert Pain management: pain level controlled Vital Signs Assessment: post-procedure vital signs reviewed and stable Respiratory status: spontaneous breathing, nonlabored ventilation, respiratory function stable and patient connected to nasal cannula oxygen Cardiovascular status: blood pressure returned to baseline and stable Postop Assessment: no apparent nausea or vomiting Anesthetic complications: no   No notable events documented.   Last Vitals:  Vitals:   03/31/22 1250 03/31/22 1300  BP: (!) 120/95 122/82  Pulse: 84 78  Resp: (!) 23 10  Temp: 37.2 C   SpO2: 94% 96%    Last Pain:  Vitals:   03/31/22 1236  TempSrc:   PainSc: Asleep                 Cleda Mccreedy Yulianna Folse

## 2022-04-02 ENCOUNTER — Other Ambulatory Visit: Payer: Self-pay | Admitting: Psychiatry

## 2022-04-02 ENCOUNTER — Encounter (HOSPITAL_BASED_OUTPATIENT_CLINIC_OR_DEPARTMENT_OTHER)
Admit: 2022-04-02 | Discharge: 2022-04-02 | Disposition: A | Discharge status: Home / Self Care | Payer: PPO | Attending: Psychiatry | Admitting: Psychiatry

## 2022-04-02 ENCOUNTER — Encounter: Payer: Self-pay | Admitting: Anesthesiology

## 2022-04-02 DIAGNOSIS — F332 Major depressive disorder, recurrent severe without psychotic features: Secondary | ICD-10-CM

## 2022-04-02 DIAGNOSIS — F319 Bipolar disorder, unspecified: Secondary | ICD-10-CM | POA: Diagnosis not present

## 2022-04-02 DIAGNOSIS — R200 Anesthesia of skin: Secondary | ICD-10-CM

## 2022-04-02 MED ORDER — METHOHEXITAL SODIUM 100 MG/10ML IV SOSY
PREFILLED_SYRINGE | INTRAVENOUS | Status: DC | PRN
  Administered 2022-04-02: 70 mg via INTRAVENOUS

## 2022-04-02 MED ORDER — SODIUM CHLORIDE 0.9 % IV SOLN
500.0000 mL | Freq: Once | INTRAVENOUS | Status: AC
  Administered 2022-04-02: 500 mL via INTRAVENOUS

## 2022-04-02 MED ORDER — MIDAZOLAM HCL 2 MG/2ML IJ SOLN
INTRAMUSCULAR | Status: AC
  Filled 2022-04-02: qty 2

## 2022-04-02 MED ORDER — KETOROLAC TROMETHAMINE 30 MG/ML IJ SOLN
INTRAMUSCULAR | Status: AC
  Administered 2022-04-02: 30 mg via INTRAVENOUS
  Filled 2022-04-02: qty 1

## 2022-04-02 MED ORDER — KETOROLAC TROMETHAMINE 30 MG/ML IJ SOLN: 30.0000 mg | Freq: Once | INTRAMUSCULAR | Status: AC

## 2022-04-02 MED ORDER — SODIUM CHLORIDE 0.9 % IV SOLN: INTRAVENOUS | Status: DC | PRN

## 2022-04-02 MED ORDER — SEVOFLURANE IN SOLN
RESPIRATORY_TRACT | Status: AC
  Filled 2022-04-02: qty 250

## 2022-04-02 MED ORDER — MIDAZOLAM HCL 2 MG/2ML IJ SOLN
2.0000 mg | Freq: Once | INTRAMUSCULAR | Status: AC
  Administered 2022-04-02: 2 mg via INTRAVENOUS

## 2022-04-02 MED ORDER — SUCCINYLCHOLINE CHLORIDE 200 MG/10ML IV SOSY
PREFILLED_SYRINGE | INTRAVENOUS | Status: DC | PRN
  Administered 2022-04-02: 80 mg via INTRAVENOUS

## 2022-04-02 NOTE — Anesthesia Postprocedure Evaluation (Signed)
Anesthesia Post Note  Patient: Kayla Holden  Procedure(s) Performed: ECT TX  Patient location during evaluation: PACU Anesthesia Type: General Level of consciousness: awake and alert Pain management: pain level controlled Vital Signs Assessment: post-procedure vital signs reviewed and stable Respiratory status: spontaneous breathing, nonlabored ventilation and respiratory function stable Cardiovascular status: blood pressure returned to baseline and stable Postop Assessment: no apparent nausea or vomiting Anesthetic complications: no   No notable events documented.   Last Vitals:  Vitals:   04/02/22 1220 04/02/22 1324  BP: 119/83 120/80  Pulse: 71 70  Resp: 11 12  Temp: 36.7 C 36.7 C  SpO2: 98%     Last Pain:  Vitals:   04/02/22 1324  TempSrc: Oral  PainSc: 0-No pain                 Foye Deer

## 2022-04-02 NOTE — H&P (Signed)
Kayla Holden is an 41 y.o. female.   Chief Complaint: Patient today was asking about maintenance ECT.  I had to remind her that first we needed to see if ECT was going to work at all and that so far it had been my impression she had not noticed much improvement.  At that point she said that she did think she was a little better but had trouble articulating exactly in what way.  Maybe a little less anxious. HPI: Recurrent depression previous response to ECT  Past Medical History:  Diagnosis Date   Anxiety    Anxiety    Bipolar 1 disorder (HCC)    Chiari malformation    Complication of anesthesia    Depression    GERD (gastroesophageal reflux disease)    MDD (major depressive disorder)    Microscopic colitis    Migraine headache    PONV (postoperative nausea and vomiting)     Past Surgical History:  Procedure Laterality Date   APPENDECTOMY     CHOLECYSTECTOMY     INCISION AND DRAINAGE OF WOUND Left 10/28/2021   Procedure: KNEE ARTHROSCOPY WITH IRRIGATION AND DEBRIDEMENT;  Surgeon: Eugenia Mcalpine, MD;  Location: [facility];  Service: Orthopedics;  Laterality: Left;  60 okay per OR   KNEE SURGERY Left    3 times   MANDIBLE SURGERY      Family History  Problem Relation Age of Onset   Cancer Maternal Grandmother    Cancer Paternal Grandfather    Allergic rhinitis Neg Hx    Angioedema Neg Hx    Asthma Neg Hx    Atopy Neg Hx    Eczema Neg Hx    Urticaria Neg Hx    Immunodeficiency Neg Hx    Social History:  reports that she has quit smoking. Her smoking use included cigarettes. She has never used smokeless tobacco. She reports current drug use. Drug: Marijuana. She reports that she does not drink alcohol.  Allergies:  Allergies  Allergen Reactions   Codeine Other (See Comments)    Patient has "CYP2D6," so NO CODEINE   Fioricet-Codeine [Butalbital-Apap-Caff-Cod] Other (See Comments)    Patient has "CYP2D6" and cannot tolerate codeine, palpitations, rapid heart rate    Isoniazid Shortness Of Breath   Ketorolac Tromethamine Other (See Comments)    Abdominal Pain if given PO. It has to be given IV   Rifampin Shortness Of Breath and Hives   Tape Other (See Comments)    Paper tape causes blisters    Clindamycin Diarrhea and Other (See Comments)    Abdominal pain, also   Ketoprofen Other (See Comments)    Abdominal pain and cannot take due to history of colitis   Nsaids Other (See Comments)    History of colitis    (Not in a hospital admission)   No results found for this or any previous visit (from the past 48 hour(s)). No results found.  Review of Systems  Constitutional: Negative.   HENT: Negative.    Eyes: Negative.   Respiratory: Negative.    Cardiovascular: Negative.   Gastrointestinal: Negative.   Musculoskeletal: Negative.   Skin: Negative.   Neurological: Negative.   Psychiatric/Behavioral: Negative.      Blood pressure 120/80, pulse 70, temperature 98 F (36.7 C), temperature source Oral, resp. rate 12, height 5\' 3"  (1.6 m), weight 64 kg, SpO2 98 %. Physical Exam Vitals reviewed.  Constitutional:      Appearance: She is well-developed.  HENT:     Head:  Normocephalic and atraumatic.  Eyes:     Conjunctiva/sclera: Conjunctivae normal.     Pupils: Pupils are equal, round, and reactive to light.  Cardiovascular:     Heart sounds: Normal heart sounds.  Pulmonary:     Effort: Pulmonary effort is normal.  Abdominal:     Palpations: Abdomen is soft.  Musculoskeletal:        General: Normal range of motion.     Cervical back: Normal range of motion.  Skin:    General: Skin is warm and dry.  Neurological:     General: No focal deficit present.     Mental Status: She is alert.  Psychiatric:        Mood and Affect: Mood normal.        Thought Content: Thought content normal.      Assessment/Plan Continue treatment on Friday although at that point I think we will need to reassess whether there is any chance for improvement  with any further  Mordecai Rasmussen, MD 04/02/2022, 4:41 PM

## 2022-04-02 NOTE — Procedures (Signed)
ECT SERVICES Physician's Interval Evaluation & Treatment Note 7  Patient Identification: Kayla Holden MRN:  [id number] Date of Evaluation:  04/02/2022 TX #: 7  MADRS:   MMSE:   P.E. Findings:  No change to physical exam  Psychiatric Interval Note:  Mood continues to be anxious stable although patient subjectively said she thought perhaps she was a little better  Subjective:  Patient is a 41 y.o. female seen for evaluation for Electroconvulsive Therapy. A little hard to pin down possibly less anxious  Treatment Summary:   []   Right Unilateral             [x]  Bilateral   % Energy : 1.0 ms 40%   Impedance: 2210 ohms  Seizure Energy Index: 13,517 V squared  Postictal Suppression Index: 84%  Seizure Concordance Index: 97%  Medications  Pre Shock: Brevital 70 mg succinylcholine 80 mg  Post Shock: Versed 2 mg  Seizure Duration: 23 seconds EMG 40 seconds EEG   Comments: Next treatment Friday and then reassess the path forward  Lungs:  [x]   Clear to auscultation               []  Other:   Heart:    [x]   Regular rhythm             []  irregular rhythm    [x]   Previous H&P reviewed, patient examined and there are NO CHANGES                 []   Previous H&P reviewed, patient examined and there are changes noted.   , MD 8/16/20234:43 PM

## 2022-04-02 NOTE — Anesthesia Preprocedure Evaluation (Signed)
Anesthesia Evaluation  Patient identified by MRN, date of birth, ID band Patient awake    Reviewed: Allergy & Precautions, NPO status , Patient's Chart, lab work & pertinent test results  History of Anesthesia Complications (+) PONV and history of anesthetic complications  Airway Mallampati: II  TM Distance: >3 FB Neck ROM: full    Dental no notable dental hx.    Pulmonary neg shortness of breath, former smoker,    Pulmonary exam normal        Cardiovascular Exercise Tolerance: Good (-) anginanegative cardio ROS Normal cardiovascular exam     Neuro/Psych  Headaches, PSYCHIATRIC DISORDERS Anxiety Depression Bipolar Disorder Chiari malformation    GI/Hepatic Neg liver ROS, GERD  Controlled,  Endo/Other  negative endocrine ROS  Renal/GU negative Renal ROS  negative genitourinary   Musculoskeletal   Abdominal Normal abdominal exam  (+)   Peds  Hematology negative hematology ROS (+)   Anesthesia Other Findings Past Medical History: No date: Anxiety No date: Anxiety No date: Bipolar 1 disorder (HCC) No date: Chiari malformation No date: Complication of anesthesia No date: Depression No date: GERD (gastroesophageal reflux disease) No date: MDD (major depressive disorder) No date: Microscopic colitis No date: Migraine headache No date: PONV (postoperative nausea and vomiting)  Past Surgical History: No date: APPENDECTOMY No date: CHOLECYSTECTOMY 10/28/2021: INCISION AND DRAINAGE OF WOUND; Left     Comment:  Procedure: KNEE ARTHROSCOPY WITH IRRIGATION AND               DEBRIDEMENT;  Surgeon: Eugenia Mcalpine, MD;  Location: [facility]              [facility];  Service: Orthopedics;  Laterality: Left;  60 okay               per OR No date: KNEE SURGERY; Left     Comment:  3 times No date: MANDIBLE SURGERY  BMI    Body Mass Index: 24.98 kg/m      Reproductive/Obstetrics negative OB ROS                              Anesthesia Physical  Anesthesia Plan  ASA: 2  Anesthesia Plan: General   Post-op Pain Management:    Induction: Intravenous  PONV Risk Score and Plan: TIVA  Airway Management Planned: Mask  Additional Equipment:   Intra-op Plan:   Post-operative Plan:   Informed Consent: I have reviewed the patients History and Physical, chart, labs and discussed the procedure including the risks, benefits and alternatives for the proposed anesthesia with the patient or authorized representative who has indicated his/her understanding and acceptance.     Dental Advisory Given  Plan Discussed with: Anesthesiologist, CRNA and Surgeon  Anesthesia Plan Comments: (Patient consented for risks of anesthesia including but not limited to:  - adverse reactions to medications - risk of airway placement if required - damage to eyes, teeth, lips or other oral mucosa - nerve damage due to positioning  - sore throat or hoarseness - Damage to heart, brain, nerves, lungs, other parts of body or loss of life  Patient voiced understanding.)        Anesthesia Quick Evaluation

## 2022-04-02 NOTE — Transfer of Care (Signed)
Immediate Anesthesia Transfer of Care Note  Patient: Kayla Holden  Procedure(s) Performed: ECT TX  Patient Location: PACU  Anesthesia Type:General  Level of Consciousness: awake and alert   Airway & Oxygen Therapy: Patient Spontanous Breathing and Patient connected to face mask oxygen  Post-op Assessment: Report given to RN and Post -op Vital signs reviewed and stable  Post vital signs: Reviewed and stable  Last Vitals:  Vitals Value Taken Time  BP 126/88 04/02/22 1151  Temp    Pulse 88 04/02/22 1151  Resp 12 04/02/22 1151  SpO2 98 % 04/02/22 1151  Vitals shown include unvalidated device data.  Last Pain:  Vitals:   04/02/22 1043  TempSrc:   PainSc: 0-No pain         Complications: No notable events documented.

## 2022-04-02 NOTE — Anesthesia Procedure Notes (Signed)
Date/Time: 04/02/2022 11:05 AM  Performed by: Junious Silk, CRNAPre-anesthesia Checklist: Patient identified, Emergency Drugs available, Suction available, Patient being monitored and Timeout performed Oxygen Delivery Method: Simple face mask Ventilation: Mask ventilation throughout procedure

## 2022-04-03 ENCOUNTER — Other Ambulatory Visit: Payer: Self-pay | Admitting: Psychiatry

## 2022-04-03 DIAGNOSIS — R200 Anesthesia of skin: Secondary | ICD-10-CM

## 2022-04-04 ENCOUNTER — Telehealth: Payer: Self-pay

## 2022-04-15 DIAGNOSIS — F419 Anxiety disorder, unspecified: Secondary | ICD-10-CM | POA: Diagnosis not present

## 2022-04-15 DIAGNOSIS — F314 Bipolar disorder, current episode depressed, severe, without psychotic features: Secondary | ICD-10-CM | POA: Diagnosis not present

## 2022-04-28 ENCOUNTER — Telehealth: Payer: Self-pay

## 2022-04-28 DIAGNOSIS — F4310 Post-traumatic stress disorder, unspecified: Secondary | ICD-10-CM | POA: Diagnosis not present

## 2022-05-02 ENCOUNTER — Other Ambulatory Visit: Payer: Self-pay | Admitting: Psychiatry

## 2022-05-02 DIAGNOSIS — R200 Anesthesia of skin: Secondary | ICD-10-CM

## 2022-05-05 ENCOUNTER — Encounter: Payer: Self-pay | Admitting: Anesthesiology

## 2022-05-05 ENCOUNTER — Encounter
Admission: RE | Admit: 2022-05-05 | Discharge: 2022-05-05 | Disposition: A | Discharge status: Home / Self Care | Payer: PPO | Attending: Psychiatry | Admitting: Psychiatry

## 2022-05-05 ENCOUNTER — Ambulatory Visit: Payer: Self-pay | Admitting: Anesthesiology

## 2022-05-05 DIAGNOSIS — Z87891 Personal history of nicotine dependence: Secondary | ICD-10-CM | POA: Insufficient documentation

## 2022-05-05 DIAGNOSIS — F32A Depression, unspecified: Secondary | ICD-10-CM | POA: Diagnosis not present

## 2022-05-05 DIAGNOSIS — F332 Major depressive disorder, recurrent severe without psychotic features: Secondary | ICD-10-CM | POA: Diagnosis not present

## 2022-05-05 DIAGNOSIS — R200 Anesthesia of skin: Secondary | ICD-10-CM | POA: Insufficient documentation

## 2022-05-05 MED ORDER — SODIUM CHLORIDE 0.9 % IV SOLN: 500.0000 mL | Freq: Once | INTRAVENOUS | Status: AC

## 2022-05-05 MED ORDER — SUCCINYLCHOLINE CHLORIDE 200 MG/10ML IV SOSY
PREFILLED_SYRINGE | INTRAVENOUS | Status: DC | PRN
  Administered 2022-05-05: 80 mg via INTRAVENOUS

## 2022-05-05 MED ORDER — METHOHEXITAL SODIUM 100 MG/10ML IV SOSY
PREFILLED_SYRINGE | INTRAVENOUS | Status: DC | PRN
  Administered 2022-05-05: 70 mg via INTRAVENOUS

## 2022-05-05 MED ORDER — ONDANSETRON HCL 4 MG/2ML IJ SOLN: 4.0000 mg | Freq: Once | INTRAMUSCULAR | Status: DC | PRN

## 2022-05-05 MED ORDER — KETOROLAC TROMETHAMINE 30 MG/ML IJ SOLN
INTRAMUSCULAR | Status: AC
  Filled 2022-05-05: qty 1

## 2022-05-05 MED ORDER — FENTANYL CITRATE (PF) 100 MCG/2ML IJ SOLN: 25.0000 ug | INTRAMUSCULAR | Status: DC | PRN

## 2022-05-05 MED ORDER — MIDAZOLAM HCL 2 MG/2ML IJ SOLN
2.0000 mg | Freq: Once | INTRAMUSCULAR | Status: AC
  Administered 2022-05-05: 2 mg via INTRAVENOUS

## 2022-05-05 MED ORDER — KETOROLAC TROMETHAMINE 30 MG/ML IJ SOLN
30.0000 mg | Freq: Once | INTRAMUSCULAR | Status: AC
  Administered 2022-05-05: 30 mg via INTRAVENOUS

## 2022-05-05 NOTE — H&P (Signed)
Kayla Holden is an 41 y.o. female.   Chief Complaint: Return of severe depression with passive suicidal thoughts HPI: Recurrent severe depression with only partial previous response to ECT  Past Medical History:  Diagnosis Date   Anxiety    Anxiety    Bipolar 1 disorder ([name])    Chiari malformation    Complication of anesthesia    Depression    GERD (gastroesophageal reflux disease)    MDD (major depressive disorder)    Microscopic colitis    Migraine headache    PONV (postoperative nausea and vomiting)     Past Surgical History:  Procedure Laterality Date   APPENDECTOMY     CHOLECYSTECTOMY     INCISION AND DRAINAGE OF WOUND Left 10/28/2021   Procedure: KNEE ARTHROSCOPY WITH IRRIGATION AND DEBRIDEMENT;  Surgeon: Sydnee Cabal, MD;  Location: [facility];  Service: Orthopedics;  Laterality: Left;  91 okay per OR   KNEE SURGERY Left    3 times   MANDIBLE SURGERY      Family History  Problem Relation Age of Onset   Cancer Maternal Grandmother    Cancer Paternal Grandfather    Allergic rhinitis Neg Hx    Angioedema Neg Hx    Asthma Neg Hx    Atopy Neg Hx    Eczema Neg Hx    Urticaria Neg Hx    Immunodeficiency Neg Hx    Social History:  reports that she has quit smoking. Her smoking use included cigarettes. She has never used smokeless tobacco. She reports current drug use. Drug: Marijuana. She reports that she does not drink alcohol.  Allergies:  Allergies  Allergen Reactions   Codeine Other (See Comments)    Patient has "CYP2D6," so NO CODEINE   Fioricet-Codeine [Butalbital-Apap-Caff-Cod] Other (See Comments)    Patient has "CYP2D6" and cannot tolerate codeine, palpitations, rapid heart rate   Isoniazid Shortness Of Breath   Ketorolac Tromethamine Other (See Comments)    Abdominal Pain if given PO. It has to be given IV   Rifampin Shortness Of Breath and Hives   Tape Other (See Comments)    Paper tape causes blisters    Clindamycin Diarrhea and Other (See  Comments)    Abdominal pain, also   Ketoprofen Other (See Comments)    Abdominal pain and cannot take due to history of colitis   Nsaids Other (See Comments)    History of colitis    (Not in a hospital admission)   No results found for this or any previous visit (from the past 48 hour(s)). No results found.  Review of Systems  Constitutional: Negative.   HENT: Negative.    Eyes: Negative.   Respiratory: Negative.    Cardiovascular: Negative.   Gastrointestinal: Negative.   Musculoskeletal: Negative.   Skin: Negative.   Neurological: Negative.   Psychiatric/Behavioral:  Positive for dysphoric mood.     Blood pressure 105/86, pulse 80, temperature 97.8 F (36.6 C), temperature source Oral, resp. rate 18, height 5\' 3"  (1.6 m), weight 64 kg, SpO2 99 %. Physical Exam Constitutional:      Appearance: She is well-developed.  HENT:     Head: Normocephalic and atraumatic.  Eyes:     Conjunctiva/sclera: Conjunctivae normal.     Pupils: Pupils are equal, round, and reactive to light.  Cardiovascular:     Heart sounds: Normal heart sounds.  Pulmonary:     Effort: Pulmonary effort is normal.  Abdominal:     Palpations: Abdomen is soft.  Musculoskeletal:  General: Normal range of motion.     Cervical back: Normal range of motion.  Skin:    General: Skin is warm and dry.  Neurological:     General: No focal deficit present.     Mental Status: She is alert.  Psychiatric:        Attention and Perception: Attention normal.        Mood and Affect: Mood is depressed.        Speech: Speech normal.        Behavior: Behavior is cooperative.        Thought Content: Thought content includes suicidal ideation. Thought content does not include suicidal plan.      Assessment/Plan Renew treatment for at least brief course  Alethia Berthold, MD 05/05/2022, 2:43 PM

## 2022-05-05 NOTE — Procedures (Signed)
ECT SERVICES Physician's Interval Evaluation & Treatment Note  Patient Identification: ANARIA KRONER MRN:  [id number] Date of Evaluation:  05/05/2022 TX #: 8  MADRS:   MMSE:   P.E. Findings:  No change to physical exam  Psychiatric Interval Note:  Reports that she is having more depression  Subjective:  Patient is a 41 y.o. female seen for evaluation for Electroconvulsive Therapy. Depressed down more anxious  Treatment Summary:   []   Right Unilateral             [x]  Bilateral   % Energy : 1.0 ms 40%   Impedance: 2310 ohms  Seizure Energy Index: 18,859 V squared  Postictal Suppression Index: 95%  Seizure Concordance Index: 98%  Medications  Pre Shock: Brevital 70 mg succinylcholine 80 mg  Post Shock:    Seizure Duration: 42 seconds EMG 49 seconds EEG   Comments: Follow-up Wednesday  Lungs:  [x]   Clear to auscultation               []  Other:   Heart:    [x]   Regular rhythm             []  irregular rhythm    [x]   Previous H&P reviewed, patient examined and there are NO CHANGES                 []   Previous H&P reviewed, patient examined and there are changes noted.   Alethia Berthold, MD 9/18/20232:48 PM

## 2022-05-05 NOTE — Transfer of Care (Signed)
Immediate Anesthesia Transfer of Care Note  Patient: Kayla Holden  Procedure(s) Performed: ECT TX  Patient Location: PACU  Anesthesia Type:General  Level of Consciousness: awake and sedated  Airway & Oxygen Therapy: Patient Spontanous Breathing and Patient connected to nasal cannula oxygen  Post-op Assessment: Report given to RN and Post -op Vital signs reviewed and stable  Post vital signs: Reviewed and stable  Last Vitals:  Vitals Value Taken Time  BP 137/83 05/05/22 1250  Temp    Pulse 97 05/05/22 1253  Resp 14 05/05/22 1253  SpO2 97 % 05/05/22 1253  Vitals shown include unvalidated device data.  Last Pain:  Vitals:   05/05/22 1111  TempSrc: Oral  PainSc: 0-No pain         Complications: No notable events documented.

## 2022-05-05 NOTE — Anesthesia Preprocedure Evaluation (Signed)
Anesthesia Evaluation  Patient identified by MRN, date of birth, ID band Patient awake    Reviewed: Allergy & Precautions, NPO status , Patient's Chart, lab work & pertinent test results  History of Anesthesia Complications (+) PONV and history of anesthetic complications  Airway Mallampati: II  TM Distance: >3 FB Neck ROM: full    Dental  (+) Teeth Intact   Pulmonary neg pulmonary ROS, former smoker,    Pulmonary exam normal breath sounds clear to auscultation       Cardiovascular Exercise Tolerance: Good negative cardio ROS Normal cardiovascular exam Rhythm:Regular     Neuro/Psych  Headaches, Anxiety Depression Bipolar Disorder negative neurological ROS  negative psych ROS   GI/Hepatic negative GI ROS, Neg liver ROS, GERD  Medicated,  Endo/Other  negative endocrine ROS  Renal/GU negative Renal ROS  negative genitourinary   Musculoskeletal   Abdominal Normal abdominal exam  (+)   Peds negative pediatric ROS (+)  Hematology negative hematology ROS (+)   Anesthesia Other Findings Past Medical History: No date: Anxiety No date: Anxiety No date: Bipolar 1 disorder ([facility]) No date: Chiari malformation No date: Complication of anesthesia No date: Depression No date: GERD (gastroesophageal reflux disease) No date: MDD (major depressive disorder) No date: Microscopic colitis No date: Migraine headache No date: PONV (postoperative nausea and vomiting)  Past Surgical History: No date: APPENDECTOMY No date: CHOLECYSTECTOMY 10/28/2021: INCISION AND DRAINAGE OF WOUND; Left     Comment:  Procedure: KNEE ARTHROSCOPY WITH IRRIGATION AND               DEBRIDEMENT;  Surgeon: Sydnee Cabal, MD;  Location: [facility];  Service: Orthopedics;  Laterality: Left;  60 okay               per OR No date: KNEE SURGERY; Left     Comment:  3 times No date: MANDIBLE SURGERY  BMI    Body Mass Index: 24.98 kg/m       Reproductive/Obstetrics negative OB ROS                             Anesthesia Physical Anesthesia Plan  ASA: 2  Anesthesia Plan: General   Post-op Pain Management:    Induction: Intravenous  PONV Risk Score and Plan: Propofol infusion and TIVA  Airway Management Planned: Natural Airway  Additional Equipment:   Intra-op Plan:   Post-operative Plan:   Informed Consent: I have reviewed the patients History and Physical, chart, labs and discussed the procedure including the risks, benefits and alternatives for the proposed anesthesia with the patient or authorized representative who has indicated his/her understanding and acceptance.     Dental Advisory Given  Plan Discussed with: CRNA and Surgeon  Anesthesia Plan Comments:         Anesthesia Quick Evaluation

## 2022-05-05 NOTE — Anesthesia Postprocedure Evaluation (Signed)
Anesthesia Post Note  Patient: Kayla Holden  Procedure(s) Performed: ECT TX  Patient location during evaluation: PACU Anesthesia Type: General Level of consciousness: awake Pain management: satisfactory to patient Vital Signs Assessment: post-procedure vital signs reviewed and stable Respiratory status: spontaneous breathing and respiratory function stable Cardiovascular status: stable Anesthetic complications: no   No notable events documented.   Last Vitals:  Vitals:   05/05/22 1309 05/05/22 1312  BP:  101/82  Pulse: 83 81  Resp: 15 16  Temp: 36.5 C   SpO2: 96% 99%    Last Pain:  Vitals:   05/05/22 1309  TempSrc:   PainSc: 0-No pain                 VAN STAVEREN,Josten [place]

## 2022-05-07 ENCOUNTER — Ambulatory Visit: Payer: Self-pay | Admitting: Certified Registered"

## 2022-05-07 ENCOUNTER — Ambulatory Visit
Admission: RE | Admit: 2022-05-07 | Discharge: 2022-05-07 | Disposition: A | Discharge status: Home / Self Care | Payer: PPO | Source: Ambulatory Visit | Attending: Psychiatry | Admitting: Psychiatry

## 2022-05-07 ENCOUNTER — Encounter: Payer: Self-pay | Admitting: Certified Registered"

## 2022-05-07 ENCOUNTER — Other Ambulatory Visit: Payer: Self-pay | Admitting: Psychiatry

## 2022-05-07 DIAGNOSIS — F419 Anxiety disorder, unspecified: Secondary | ICD-10-CM | POA: Diagnosis not present

## 2022-05-07 DIAGNOSIS — F332 Major depressive disorder, recurrent severe without psychotic features: Secondary | ICD-10-CM | POA: Diagnosis not present

## 2022-05-07 DIAGNOSIS — F32A Depression, unspecified: Secondary | ICD-10-CM | POA: Insufficient documentation

## 2022-05-07 DIAGNOSIS — Z87891 Personal history of nicotine dependence: Secondary | ICD-10-CM | POA: Insufficient documentation

## 2022-05-07 DIAGNOSIS — R200 Anesthesia of skin: Secondary | ICD-10-CM

## 2022-05-07 MED ORDER — OXYCODONE HCL 5 MG/5ML PO SOLN: 5.0000 mg | Freq: Once | ORAL | Status: DC | PRN

## 2022-05-07 MED ORDER — METHOHEXITAL SODIUM 100 MG/10ML IV SOSY
PREFILLED_SYRINGE | INTRAVENOUS | Status: DC | PRN
  Administered 2022-05-07: 70 mg via INTRAVENOUS

## 2022-05-07 MED ORDER — MIDAZOLAM HCL 2 MG/2ML IJ SOLN
INTRAMUSCULAR | Status: DC | PRN
  Administered 2022-05-07: 2 mg via INTRAVENOUS

## 2022-05-07 MED ORDER — OXYCODONE HCL 5 MG PO TABS: 5.0000 mg | ORAL_TABLET | Freq: Once | ORAL | Status: DC | PRN

## 2022-05-07 MED ORDER — SODIUM CHLORIDE 0.9 % IV SOLN: 500.0000 mL | Freq: Once | INTRAVENOUS | Status: AC

## 2022-05-07 MED ORDER — FENTANYL CITRATE (PF) 100 MCG/2ML IJ SOLN: 25.0000 ug | INTRAMUSCULAR | Status: DC | PRN

## 2022-05-07 MED ORDER — MIDAZOLAM HCL 2 MG/2ML IJ SOLN
INTRAMUSCULAR | Status: AC
  Filled 2022-05-07: qty 2

## 2022-05-07 MED ORDER — LABETALOL HCL 5 MG/ML IV SOLN
INTRAVENOUS | Status: DC | PRN
  Administered 2022-05-07 (×2): 5 mg via INTRAVENOUS

## 2022-05-07 MED ORDER — KETOROLAC TROMETHAMINE 30 MG/ML IJ SOLN: 30.0000 mg | Freq: Once | INTRAMUSCULAR | Status: AC

## 2022-05-07 MED ORDER — SUCCINYLCHOLINE CHLORIDE 200 MG/10ML IV SOSY
PREFILLED_SYRINGE | INTRAVENOUS | Status: DC | PRN
  Administered 2022-05-07: 80 mg via INTRAVENOUS

## 2022-05-07 MED ORDER — KETOROLAC TROMETHAMINE 30 MG/ML IJ SOLN
INTRAMUSCULAR | Status: AC
  Administered 2022-05-07: 30 mg via INTRAVENOUS
  Filled 2022-05-07: qty 1

## 2022-05-07 NOTE — H&P (Signed)
Kayla Holden is an 41 y.o. female.   Chief Complaint: Patient continues to have severe anxiety and dysphoria and no active suicidal intent or plan HPI: Recurrent anxiety and depression  Past Medical History:  Diagnosis Date   Anxiety    Anxiety    Bipolar 1 disorder ([facility])    Chiari malformation    Complication of anesthesia    Depression    GERD (gastroesophageal reflux disease)    MDD (major depressive disorder)    Microscopic colitis    Migraine headache    PONV (postoperative nausea and vomiting)     Past Surgical History:  Procedure Laterality Date   APPENDECTOMY     CHOLECYSTECTOMY     INCISION AND DRAINAGE OF WOUND Left 10/28/2021   Procedure: KNEE ARTHROSCOPY WITH IRRIGATION AND DEBRIDEMENT;  Surgeon: Eugenia Mcalpine, MD;  Location: [facility];  Service: Orthopedics;  Laterality: Left;  60 okay per OR   KNEE SURGERY Left    3 times   MANDIBLE SURGERY      Family History  Problem Relation Age of Onset   Cancer Maternal Grandmother    Cancer Paternal Grandfather    Allergic rhinitis Neg Hx    Angioedema Neg Hx    Asthma Neg Hx    Atopy Neg Hx    Eczema Neg Hx    Urticaria Neg Hx    Immunodeficiency Neg Hx    Social History:  reports that she has quit smoking. Her smoking use included cigarettes. She has never used smokeless tobacco. She reports current drug use. Drug: Marijuana. She reports that she does not drink alcohol.  Allergies:  Allergies  Allergen Reactions   Codeine Other (See Comments)    Patient has "CYP2D6," so NO CODEINE   Fioricet-Codeine [Butalbital-Apap-Caff-Cod] Other (See Comments)    Patient has "CYP2D6" and cannot tolerate codeine, palpitations, rapid heart rate   Isoniazid Shortness Of Breath   Ketorolac Tromethamine Other (See Comments)    Abdominal Pain if given PO. It has to be given IV   Rifampin Shortness Of Breath and Hives   Tape Other (See Comments)    Paper tape causes blisters    Clindamycin Diarrhea and Other (See  Comments)    Abdominal pain, also   Ketoprofen Other (See Comments)    Abdominal pain and cannot take due to history of colitis   Nsaids Other (See Comments)    History of colitis    (Not in a hospital admission)   No results found for this or any previous visit (from the past 48 hour(s)). No results found.  Review of Systems  Constitutional: Negative.   HENT: Negative.    Eyes: Negative.   Respiratory: Negative.    Cardiovascular: Negative.   Gastrointestinal: Negative.   Musculoskeletal: Negative.   Skin: Negative.   Neurological: Negative.   Psychiatric/Behavioral:  Positive for dysphoric mood. The patient is nervous/anxious.     Blood pressure 127/85, pulse 80, temperature 98.6 F (37 C), temperature source Oral, resp. rate 20, SpO2 99 %. Physical Exam Vitals reviewed.  Constitutional:      Appearance: She is well-developed.  HENT:     Head: Normocephalic and atraumatic.  Eyes:     Conjunctiva/sclera: Conjunctivae normal.     Pupils: Pupils are equal, round, and reactive to light.  Cardiovascular:     Heart sounds: Normal heart sounds.  Pulmonary:     Effort: Pulmonary effort is normal.  Abdominal:     Palpations: Abdomen is soft.  Musculoskeletal:  General: Normal range of motion.     Cervical back: Normal range of motion.  Skin:    General: Skin is warm and dry.  Neurological:     General: No focal deficit present.     Mental Status: She is alert.  Psychiatric:        Mood and Affect: Mood normal.      Assessment/Plan Treatment today follow-up in another 3 weeks.  Alethia Berthold, MD 05/07/2022, 5:05 PM

## 2022-05-07 NOTE — Procedures (Signed)
ECT SERVICES Physician's Interval Evaluation & Treatment Note  Patient Identification: RETAJ HILBUN MRN:  [id number] Date of Evaluation:  05/07/2022 TX #: 9  MADRS:   MMSE:   P.E. Findings:  No change to physical exam  Psychiatric Interval Note:  Chronic anxiety and depression with ongoing anxiety issues  Subjective:  Patient is a 41 y.o. female seen for evaluation for Electroconvulsive Therapy. Anxious overly worried no suicidal thought or intent  Treatment Summary:   []   Right Unilateral             [x]  Bilateral   % Energy : 1.0 ms 40%   Impedance: 2210 ohms  Seizure Energy Index: 11,404 V squared  Postictal Suppression Index: 82%  Seizure Concordance Index: 96%  Medications  Pre Shock: Brevital 70 mg succinylcholine 80 mg  Post Shock:    Seizure Duration: 33 seconds EMG 65 seconds EEG   Comments: Follow-up in 3 to 4 weeks.  Lungs:  [x]   Clear to auscultation               []  Other:   Heart:    [x]   Regular rhythm             []  irregular rhythm    [x]   Previous H&P reviewed, patient examined and there are NO CHANGES                 []   Previous H&P reviewed, patient examined and there are changes noted.   Alethia Berthold, MD 9/20/20235:06 PM

## 2022-05-07 NOTE — Anesthesia Preprocedure Evaluation (Signed)
Anesthesia Evaluation  Patient identified by MRN, date of birth, ID band Patient awake    Reviewed: Allergy & Precautions, NPO status , Patient's Chart, lab work & pertinent test results  History of Anesthesia Complications (+) PONV and history of anesthetic complications  Airway Mallampati: III  TM Distance: >3 FB Neck ROM: full    Dental  (+) Chipped   Pulmonary neg shortness of breath, former smoker,    Pulmonary exam normal        Cardiovascular Exercise Tolerance: Good (-) anginanegative cardio ROS Normal cardiovascular exam     Neuro/Psych  Headaches, PSYCHIATRIC DISORDERS    GI/Hepatic Neg liver ROS, GERD  Controlled,  Endo/Other  negative endocrine ROS  Renal/GU negative Renal ROS  negative genitourinary   Musculoskeletal   Abdominal   Peds  Hematology negative hematology ROS (+)   Anesthesia Other Findings Past Medical History: No date: Anxiety No date: Anxiety No date: Bipolar 1 disorder ([facility]) No date: Chiari malformation No date: Complication of anesthesia No date: Depression No date: GERD (gastroesophageal reflux disease) No date: MDD (major depressive disorder) No date: Microscopic colitis No date: Migraine headache No date: PONV (postoperative nausea and vomiting)  Past Surgical History: No date: APPENDECTOMY No date: CHOLECYSTECTOMY 10/28/2021: INCISION AND DRAINAGE OF WOUND; Left     Comment:  Procedure: KNEE ARTHROSCOPY WITH IRRIGATION AND               DEBRIDEMENT;  Surgeon: Sydnee Cabal, MD;  Location: [facility];  Service: Orthopedics;  Laterality: Left;  60 okay               per OR No date: KNEE SURGERY; Left     Comment:  3 times No date: MANDIBLE SURGERY  BMI    Body Mass Index: 24.98 kg/m      Reproductive/Obstetrics negative OB ROS                             Anesthesia Physical  Anesthesia Plan  ASA: 2  Anesthesia Plan:  General   Post-op Pain Management:    Induction: Intravenous  PONV Risk Score and Plan: Propofol infusion and TIVA  Airway Management Planned: Mask  Additional Equipment:   Intra-op Plan:   Post-operative Plan:   Informed Consent: I have reviewed the patients History and Physical, chart, labs and discussed the procedure including the risks, benefits and alternatives for the proposed anesthesia with the patient or authorized representative who has indicated his/her understanding and acceptance.     Dental Advisory Given  Plan Discussed with: Anesthesiologist, CRNA and Surgeon  Anesthesia Plan Comments: (Patient consented for risks of anesthesia including but not limited to:  - adverse reactions to medications - risk of airway placement if required - damage to eyes, teeth, lips or other oral mucosa - nerve damage due to positioning  - sore throat or hoarseness - Damage to heart, brain, nerves, lungs, other parts of body or loss of life  Patient voiced understanding.)        Anesthesia Quick Evaluation

## 2022-05-07 NOTE — Transfer of Care (Signed)
Immediate Anesthesia Transfer of Care Note  Patient: Kayla Holden  Procedure(s) Performed: ECT TX  Patient Location: PACU  Anesthesia Type:General  Level of Consciousness: drowsy  Airway & Oxygen Therapy: Patient Spontanous Breathing and Patient connected to nasal cannula oxygen  Post-op Assessment: Report given to RN and Post -op Vital signs reviewed and stable  Post vital signs: Reviewed and stable  Last Vitals:  Vitals Value Taken Time  BP 146/95 05/07/22 1318  Temp 36.7 C 05/07/22 1318  Pulse 84 05/07/22 1318  Resp 14 05/07/22 1318  SpO2 100 % 05/07/22 1318  Vitals shown include unvalidated device data.  Last Pain:  Vitals:   05/07/22 1117  PainSc: 0-No pain         Complications: No notable events documented.

## 2022-05-07 NOTE — Anesthesia Procedure Notes (Signed)
Procedure Name: General with mask airway Date/Time: 05/07/2022 1:10 PM  Performed by: Jerrye Noble, CRNAPre-anesthesia Checklist: Patient identified, Emergency Drugs available, Suction available, Patient being monitored and Timeout performed Patient Re-evaluated:Patient Re-evaluated prior to induction Oxygen Delivery Method: Circle system utilized Preoxygenation: Pre-oxygenation with 100% oxygen Induction Type: IV induction Ventilation: Mask ventilation without difficulty Airway Equipment and Method: Bite block Dental Injury: Teeth and Oropharynx as per pre-operative assessment

## 2022-05-07 NOTE — Anesthesia Postprocedure Evaluation (Signed)
Anesthesia Post Note  Patient: MERLIN EGE  Procedure(s) Performed: ECT TX  Patient location during evaluation: PACU Anesthesia Type: General Level of consciousness: awake and alert Pain management: pain level controlled Vital Signs Assessment: post-procedure vital signs reviewed and stable Respiratory status: spontaneous breathing, nonlabored ventilation, respiratory function stable and patient connected to nasal cannula oxygen Cardiovascular status: blood pressure returned to baseline and stable Postop Assessment: no apparent nausea or vomiting Anesthetic complications: no   No notable events documented.   Last Vitals:  Vitals:   05/07/22 1340 05/07/22 1415  BP: 127/88 127/85  Pulse: 85 80  Resp: 20 20  Temp: 37 C 37 C  SpO2: 99%     Last Pain:  Vitals:   05/07/22 1415  TempSrc: Oral  PainSc: 0-No pain                 Precious Haws Jacquelina Hewins

## 2022-05-12 DIAGNOSIS — F4310 Post-traumatic stress disorder, unspecified: Secondary | ICD-10-CM | POA: Diagnosis not present

## 2022-05-13 DIAGNOSIS — B0223 Postherpetic polyneuropathy: Secondary | ICD-10-CM | POA: Diagnosis not present

## 2022-05-13 DIAGNOSIS — Z6824 Body mass index (BMI) 24.0-24.9, adult: Secondary | ICD-10-CM | POA: Diagnosis not present

## 2022-05-19 DIAGNOSIS — F4310 Post-traumatic stress disorder, unspecified: Secondary | ICD-10-CM | POA: Diagnosis not present

## 2022-05-19 DIAGNOSIS — F419 Anxiety disorder, unspecified: Secondary | ICD-10-CM | POA: Diagnosis not present

## 2022-05-19 DIAGNOSIS — F314 Bipolar disorder, current episode depressed, severe, without psychotic features: Secondary | ICD-10-CM | POA: Diagnosis not present

## 2022-05-26 DIAGNOSIS — F4310 Post-traumatic stress disorder, unspecified: Secondary | ICD-10-CM | POA: Diagnosis not present

## 2022-06-01 ENCOUNTER — Other Ambulatory Visit: Payer: Self-pay | Admitting: Psychiatry

## 2022-06-01 DIAGNOSIS — R200 Anesthesia of skin: Secondary | ICD-10-CM

## 2022-06-02 ENCOUNTER — Encounter
Admission: RE | Admit: 2022-06-02 | Discharge: 2022-06-02 | Disposition: A | Discharge status: Home / Self Care | Payer: PPO | Attending: Psychiatry | Admitting: Psychiatry

## 2022-06-02 DIAGNOSIS — Z87891 Personal history of nicotine dependence: Secondary | ICD-10-CM | POA: Diagnosis not present

## 2022-06-02 DIAGNOSIS — R45851 Suicidal ideations: Secondary | ICD-10-CM | POA: Insufficient documentation

## 2022-06-02 DIAGNOSIS — F332 Major depressive disorder, recurrent severe without psychotic features: Secondary | ICD-10-CM | POA: Diagnosis not present

## 2022-06-02 DIAGNOSIS — R200 Anesthesia of skin: Secondary | ICD-10-CM | POA: Diagnosis not present

## 2022-06-02 MED ORDER — MIDAZOLAM HCL 2 MG/2ML IJ SOLN
INTRAMUSCULAR | Status: AC
  Filled 2022-06-02: qty 2

## 2022-06-02 MED ORDER — LABETALOL HCL 5 MG/ML IV SOLN
INTRAVENOUS | Status: AC
  Filled 2022-06-02: qty 8

## 2022-06-02 MED ORDER — SODIUM CHLORIDE 0.9 % IV SOLN
500.0000 mL | Freq: Once | INTRAVENOUS | Status: AC
  Administered 2022-06-02: 500 mL via INTRAVENOUS

## 2022-06-02 MED ORDER — MIDAZOLAM HCL 2 MG/2ML IJ SOLN
INTRAMUSCULAR | Status: DC | PRN
  Administered 2022-06-02: 2 mg via INTRAVENOUS

## 2022-06-02 MED ORDER — METHOHEXITAL SODIUM 100 MG/10ML IV SOSY
PREFILLED_SYRINGE | INTRAVENOUS | Status: DC | PRN
  Administered 2022-06-02: 70 mg via INTRAVENOUS

## 2022-06-02 MED ORDER — SUCCINYLCHOLINE CHLORIDE 200 MG/10ML IV SOSY
PREFILLED_SYRINGE | INTRAVENOUS | Status: DC | PRN
  Administered 2022-06-02: 80 mg via INTRAVENOUS

## 2022-06-02 MED ORDER — KETOROLAC TROMETHAMINE 30 MG/ML IJ SOLN: 30.0000 mg | Freq: Once | INTRAMUSCULAR | Status: AC

## 2022-06-02 MED ORDER — LABETALOL HCL 5 MG/ML IV SOLN
INTRAVENOUS | Status: DC | PRN
  Administered 2022-06-02: 10 mg via INTRAVENOUS

## 2022-06-02 MED ORDER — KETOROLAC TROMETHAMINE 30 MG/ML IJ SOLN
INTRAMUSCULAR | Status: AC
  Administered 2022-06-02: 30 mg via INTRAVENOUS
  Filled 2022-06-02: qty 1

## 2022-06-02 NOTE — Anesthesia Postprocedure Evaluation (Signed)
Anesthesia Post Note  Patient: Kayla Holden  Procedure(s) Performed: ECT TX  Patient location during evaluation: PACU Anesthesia Type: General Level of consciousness: awake and oriented Pain management: satisfactory to patient Vital Signs Assessment: post-procedure vital signs reviewed and stable Respiratory status: spontaneous breathing and nonlabored ventilation Cardiovascular status: stable Anesthetic complications: no  No notable events documented.   Last Vitals:  Vitals:   06/02/22 1215 06/02/22 1220  BP: 137/86 (!) 133/91  Pulse: 94 99  Resp: 17 18  Temp: 36.9 C   SpO2: 99% 98%    Last Pain:  Vitals:   06/02/22 1215  TempSrc:   PainSc: Asleep                 VAN STAVEREN,Christelle Igoe

## 2022-06-02 NOTE — Anesthesia Preprocedure Evaluation (Signed)
Anesthesia Evaluation  Patient identified by MRN, date of birth, ID band Patient awake    Reviewed: Allergy & Precautions, NPO status , Patient's Chart, lab work & pertinent test results  History of Anesthesia Complications (+) PONV and history of anesthetic complications  Airway Mallampati: III  TM Distance: >3 FB Neck ROM: full    Dental  (+) Teeth Intact   Pulmonary neg pulmonary ROS, former smoker,    Pulmonary exam normal breath sounds clear to auscultation       Cardiovascular Exercise Tolerance: Good negative cardio ROS Normal cardiovascular exam Rhythm:Regular     Neuro/Psych  Headaches, Anxiety Depression Bipolar Disorder negative neurological ROS  negative psych ROS   GI/Hepatic negative GI ROS, Neg liver ROS, GERD  Medicated,  Endo/Other  negative endocrine ROS  Renal/GU negative Renal ROS  negative genitourinary   Musculoskeletal negative musculoskeletal ROS (+)   Abdominal Normal abdominal exam  (+)   Peds negative pediatric ROS (+)  Hematology negative hematology ROS (+)   Anesthesia Other Findings Past Medical History: No date: Anxiety No date: Anxiety No date: Bipolar 1 disorder ([facility]) No date: Chiari malformation No date: Complication of anesthesia No date: Depression No date: GERD (gastroesophageal reflux disease) No date: MDD (major depressive disorder) No date: Microscopic colitis No date: Migraine headache No date: PONV (postoperative nausea and vomiting)  Past Surgical History: No date: APPENDECTOMY No date: CHOLECYSTECTOMY 10/28/2021: INCISION AND DRAINAGE OF WOUND; Left     Comment:  Procedure: KNEE ARTHROSCOPY WITH IRRIGATION AND               DEBRIDEMENT;  Surgeon: Sydnee Cabal, MD;  Location: [facility];  Service: Orthopedics;  Laterality: Left;  60 okay               per OR No date: KNEE SURGERY; Left     Comment:  3 times No date: MANDIBLE SURGERY  BMI     Body Mass Index: 24.98 kg/m      Reproductive/Obstetrics negative OB ROS                             Anesthesia Physical Anesthesia Plan  ASA: 2  Anesthesia Plan: General   Post-op Pain Management:    Induction: Intravenous  PONV Risk Score and Plan: Propofol infusion and TIVA  Airway Management Planned: Mask and Natural Airway  Additional Equipment:   Intra-op Plan:   Post-operative Plan:   Informed Consent: I have reviewed the patients History and Physical, chart, labs and discussed the procedure including the risks, benefits and alternatives for the proposed anesthesia with the patient or authorized representative who has indicated his/her understanding and acceptance.     Dental Advisory Given  Plan Discussed with: CRNA and Surgeon  Anesthesia Plan Comments:         Anesthesia Quick Evaluation

## 2022-06-02 NOTE — Procedures (Signed)
ECT SERVICES Physician's Interval Evaluation & Treatment Note  Patient Identification: Kayla Holden MRN:  [id number] Date of Evaluation:  06/02/2022 TX #: 10  MADRS:   MMSE:   P.E. Findings:  No change to physical exam  Psychiatric Interval Note:  Remains very depressed  Subjective:  Patient is a 41 y.o. female seen for evaluation for Electroconvulsive Therapy. Tearful and depressed  Treatment Summary:   []   Right Unilateral             [x]  Bilateral   % Energy : 1.0 ms 40%   Impedance: 2150 ohms  Seizure Energy Index:    Postictal Suppression Index:   Seizure Concordance Index: 93%  Medications  Pre Shock: Brevital 70 mg succinylcholine 80 mg  Post Shock:    Seizure Duration: 46 seconds EMG 46 seconds EEG   Comments: Follow-up Wednesday  Lungs:  [x]   Clear to auscultation               []  Other:   Heart:    [x]   Regular rhythm             []  irregular rhythm    [x]   Previous H&P reviewed, patient examined and there are NO CHANGES                 []   Previous H&P reviewed, patient examined and there are changes noted.   Alethia Berthold, MD 10/16/20236:56 PM

## 2022-06-02 NOTE — Transfer of Care (Signed)
Immediate Anesthesia Transfer of Care Note  Patient: Kayla Holden  Procedure(s) Performed: ECT TX  Patient Location: PACU  Anesthesia Type:General  Level of Consciousness: drowsy  Airway & Oxygen Therapy: Patient Spontanous Breathing and Patient connected to face mask oxygen  Post-op Assessment: Report given to RN and Post -op Vital signs reviewed and stable  Post vital signs: Reviewed and stable  Last Vitals:  Vitals Value Taken Time  BP 137/86 06/02/22 1215  Temp    Pulse 100 06/02/22 1216  Resp 22 06/02/22 1216  SpO2 100 % 06/02/22 1216  Vitals shown include unvalidated device data.  Last Pain:  Vitals:   06/02/22 1150  TempSrc:   PainSc: 0-No pain         Complications: No notable events documented.

## 2022-06-02 NOTE — H&P (Signed)
Kayla Holden is an 41 y.o. female.   Chief Complaint: Patient remains chronically depressed.  Suicidal thoughts without intent or plan HPI: Recurrent depression  Past Medical History:  Diagnosis Date   Anxiety    Anxiety    Bipolar 1 disorder (HCC)    Chiari malformation    Complication of anesthesia    Depression    GERD (gastroesophageal reflux disease)    MDD (major depressive disorder)    Microscopic colitis    Migraine headache    PONV (postoperative nausea and vomiting)     Past Surgical History:  Procedure Laterality Date   APPENDECTOMY     CHOLECYSTECTOMY     INCISION AND DRAINAGE OF WOUND Left 10/28/2021   Procedure: KNEE ARTHROSCOPY WITH IRRIGATION AND DEBRIDEMENT;  Surgeon: Eugenia Mcalpine, MD;  Location: [facility];  Service: Orthopedics;  Laterality: Left;  60 okay per OR   KNEE SURGERY Left    3 times   MANDIBLE SURGERY      Family History  Problem Relation Age of Onset   Cancer Maternal Grandmother    Cancer Paternal Grandfather    Allergic rhinitis Neg Hx    Angioedema Neg Hx    Asthma Neg Hx    Atopy Neg Hx    Eczema Neg Hx    Urticaria Neg Hx    Immunodeficiency Neg Hx    Social History:  reports that she has quit smoking. Her smoking use included cigarettes. She has never used smokeless tobacco. She reports current drug use. Drug: Marijuana. She reports that she does not drink alcohol.  Allergies:  Allergies  Allergen Reactions   Codeine Other (See Comments)    Patient has "CYP2D6," so NO CODEINE   Fioricet-Codeine [Butalbital-Apap-Caff-Cod] Other (See Comments)    Patient has "CYP2D6" and cannot tolerate codeine, palpitations, rapid heart rate   Isoniazid Shortness Of Breath   Ketorolac Tromethamine Other (See Comments)    Abdominal Pain if given PO. It has to be given IV   Rifampin Shortness Of Breath and Hives   Tape Other (See Comments)    Paper tape causes blisters    Clindamycin Diarrhea and Other (See Comments)    Abdominal pain,  also   Ketoprofen Other (See Comments)    Abdominal pain and cannot take due to history of colitis   Nsaids Other (See Comments)    History of colitis    (Not in a hospital admission)   No results found for this or any previous visit (from the past 48 hour(s)). No results found.  Review of Systems  Constitutional: Negative.   HENT: Negative.    Eyes: Negative.   Respiratory: Negative.    Cardiovascular: Negative.   Gastrointestinal: Negative.   Musculoskeletal: Negative.   Skin: Negative.   Neurological: Negative.   Psychiatric/Behavioral: Negative.      Blood pressure 119/84, pulse 81, temperature 98.3 F (36.8 C), resp. rate 15, height 5\' 3"  (1.6 m), weight 64 kg, SpO2 99 %. Physical Exam Vitals and nursing note reviewed.  Constitutional:      Appearance: She is well-developed.  HENT:     Head: Normocephalic and atraumatic.  Eyes:     Conjunctiva/sclera: Conjunctivae normal.     Pupils: Pupils are equal, round, and reactive to light.  Cardiovascular:     Heart sounds: Normal heart sounds.  Pulmonary:     Effort: Pulmonary effort is normal.  Abdominal:     Palpations: Abdomen is soft.  Musculoskeletal:  General: Normal range of motion.     Cervical back: Normal range of motion.  Skin:    General: Skin is warm and dry.  Neurological:     Mental Status: She is alert.  Psychiatric:        Attention and Perception: Attention normal. She is attentive.        Mood and Affect: Mood is depressed. Affect is tearful.        Speech: Speech is delayed.        Behavior: Behavior is slowed.        Thought Content: Thought content includes suicidal ideation. Thought content does not include suicidal plan.      Assessment/Plan Patient is glad to receive ECT today and is also on the schedule for Wednesday  Alethia Berthold, MD 06/02/2022, 6:54 PM

## 2022-06-02 NOTE — Anesthesia Procedure Notes (Signed)
Procedure Name: General with mask airway Date/Time: 06/02/2022 12:01 PM  Performed by: Lily Peer, Jacquline Terrill, CRNAPre-anesthesia Checklist: Patient identified, Emergency Drugs available, Suction available, Patient being monitored and Timeout performed Patient Re-evaluated:Patient Re-evaluated prior to induction Oxygen Delivery Method: Circle system utilized Preoxygenation: Pre-oxygenation with 100% oxygen Induction Type: IV induction Ventilation: Mask ventilation throughout procedure

## 2022-06-03 ENCOUNTER — Other Ambulatory Visit: Payer: Self-pay | Admitting: Psychiatry

## 2022-06-03 DIAGNOSIS — Z01419 Encounter for gynecological examination (general) (routine) without abnormal findings: Secondary | ICD-10-CM | POA: Diagnosis not present

## 2022-06-03 DIAGNOSIS — Z23 Encounter for immunization: Secondary | ICD-10-CM | POA: Diagnosis not present

## 2022-06-03 DIAGNOSIS — R200 Anesthesia of skin: Secondary | ICD-10-CM

## 2022-06-03 DIAGNOSIS — Z6825 Body mass index (BMI) 25.0-25.9, adult: Secondary | ICD-10-CM | POA: Diagnosis not present

## 2022-06-04 ENCOUNTER — Encounter: Payer: Self-pay | Admitting: Certified Registered"

## 2022-06-04 ENCOUNTER — Encounter (HOSPITAL_BASED_OUTPATIENT_CLINIC_OR_DEPARTMENT_OTHER)
Admission: RE | Admit: 2022-06-04 | Discharge: 2022-06-04 | Disposition: A | Discharge status: Home / Self Care | Payer: PPO | Source: Ambulatory Visit | Attending: Psychiatry | Admitting: Psychiatry

## 2022-06-04 DIAGNOSIS — R200 Anesthesia of skin: Secondary | ICD-10-CM

## 2022-06-04 DIAGNOSIS — F332 Major depressive disorder, recurrent severe without psychotic features: Secondary | ICD-10-CM | POA: Diagnosis not present

## 2022-06-04 MED ORDER — SUCCINYLCHOLINE CHLORIDE 200 MG/10ML IV SOSY
PREFILLED_SYRINGE | INTRAVENOUS | Status: DC | PRN
  Administered 2022-06-04: 80 mg via INTRAVENOUS

## 2022-06-04 MED ORDER — MIDAZOLAM HCL 2 MG/2ML IJ SOLN
INTRAMUSCULAR | Status: DC | PRN
  Administered 2022-06-04: 2 mg via INTRAVENOUS

## 2022-06-04 MED ORDER — MIDAZOLAM HCL 2 MG/2ML IJ SOLN
INTRAMUSCULAR | Status: AC
  Filled 2022-06-04: qty 2

## 2022-06-04 MED ORDER — SODIUM CHLORIDE 0.9 % IV SOLN: INTRAVENOUS | Status: DC | PRN

## 2022-06-04 MED ORDER — LABETALOL HCL 5 MG/ML IV SOLN
INTRAVENOUS | Status: DC | PRN
  Administered 2022-06-04: 5 mg via INTRAVENOUS

## 2022-06-04 MED ORDER — METHOHEXITAL SODIUM 100 MG/10ML IV SOSY
PREFILLED_SYRINGE | INTRAVENOUS | Status: DC | PRN
  Administered 2022-06-04: 70 mg via INTRAVENOUS

## 2022-06-04 MED ORDER — SODIUM CHLORIDE 0.9 % IV SOLN
500.0000 mL | Freq: Once | INTRAVENOUS | Status: AC
  Administered 2022-06-04: 500 mL via INTRAVENOUS

## 2022-06-04 MED ORDER — KETOROLAC TROMETHAMINE 30 MG/ML IJ SOLN
INTRAMUSCULAR | Status: AC
  Administered 2022-06-04: 30 mg
  Filled 2022-06-04: qty 1

## 2022-06-04 MED ORDER — METHOHEXITAL SODIUM 0.5 G IJ SOLR
INTRAMUSCULAR | Status: AC
  Filled 2022-06-04: qty 500

## 2022-06-04 NOTE — H&P (Signed)
Kayla Holden is an 41 y.o. female.   Chief Complaint: Chronic depression but better than the other day.  No new complaint HPI: History of chronic depression  Past Medical History:  Diagnosis Date   Anxiety    Anxiety    Bipolar 1 disorder (HCC)    Chiari malformation    Complication of anesthesia    Depression    GERD (gastroesophageal reflux disease)    MDD (major depressive disorder)    Microscopic colitis    Migraine headache    PONV (postoperative nausea and vomiting)     Past Surgical History:  Procedure Laterality Date   APPENDECTOMY     CHOLECYSTECTOMY     INCISION AND DRAINAGE OF WOUND Left 10/28/2021   Procedure: KNEE ARTHROSCOPY WITH IRRIGATION AND DEBRIDEMENT;  Surgeon: Eugenia Mcalpine, MD;  Location: [facility];  Service: Orthopedics;  Laterality: Left;  60 okay per OR   KNEE SURGERY Left    3 times   MANDIBLE SURGERY      Family History  Problem Relation Age of Onset   Cancer Maternal Grandmother    Cancer Paternal Grandfather    Allergic rhinitis Neg Hx    Angioedema Neg Hx    Asthma Neg Hx    Atopy Neg Hx    Eczema Neg Hx    Urticaria Neg Hx    Immunodeficiency Neg Hx    Social History:  reports that she has quit smoking. Her smoking use included cigarettes. She has never used smokeless tobacco. She reports current drug use. Drug: Marijuana. She reports that she does not drink alcohol.  Allergies:  Allergies  Allergen Reactions   Codeine Other (See Comments)    Patient has "CYP2D6," so NO CODEINE   Fioricet-Codeine [Butalbital-Apap-Caff-Cod] Other (See Comments)    Patient has "CYP2D6" and cannot tolerate codeine, palpitations, rapid heart rate   Isoniazid Shortness Of Breath   Ketorolac Tromethamine Other (See Comments)    Abdominal Pain if given PO. It has to be given IV   Rifampin Shortness Of Breath and Hives   Tape Other (See Comments)    Paper tape causes blisters    Clindamycin Diarrhea and Other (See Comments)    Abdominal pain, also    Ketoprofen Other (See Comments)    Abdominal pain and cannot take due to history of colitis   Nsaids Other (See Comments)    History of colitis    (Not in a hospital admission)   No results found for this or any previous visit (from the past 48 hour(s)). No results found.  Review of Systems  Constitutional: Negative.   HENT: Negative.    Eyes: Negative.   Respiratory: Negative.    Cardiovascular: Negative.   Gastrointestinal: Negative.   Musculoskeletal: Negative.   Skin: Negative.   Neurological: Negative.   Psychiatric/Behavioral:  Positive for behavioral problems and dysphoric mood. Negative for agitation and hallucinations. The patient is not nervous/anxious and is not hyperactive.     Blood pressure 120/64, pulse 80, temperature 98.9 F (37.2 C), temperature source Oral, resp. rate 14, SpO2 96 %. Physical Exam Constitutional:      Appearance: She is well-developed.  HENT:     Head: Normocephalic and atraumatic.  Eyes:     Conjunctiva/sclera: Conjunctivae normal.     Pupils: Pupils are equal, round, and reactive to light.  Cardiovascular:     Heart sounds: Normal heart sounds.  Pulmonary:     Effort: Pulmonary effort is normal.  Abdominal:  Palpations: Abdomen is soft.  Musculoskeletal:        General: Normal range of motion.     Cervical back: Normal range of motion.  Skin:    General: Skin is warm and dry.  Neurological:     Mental Status: She is alert.  Psychiatric:        Attention and Perception: Attention normal.        Mood and Affect: Mood is depressed.        Speech: Speech normal.        Behavior: Behavior is cooperative.        Thought Content: Thought content does not include suicidal ideation.      Assessment/Plan ECT today next treatment will be scheduled in about 3 weeks  Kayla Berthold, MD 06/04/2022, 5:37 PM

## 2022-06-04 NOTE — Procedures (Signed)
ECT SERVICES Physician's Interval Evaluation & Treatment Note  Patient Identification: Kayla Holden MRN:  [id number] Date of Evaluation:  06/04/2022 TX #: 11  MADRS:   MMSE:   P.E. Findings:  No change to physical exam  Psychiatric Interval Note:  Still depressed but not as suicidal  Subjective:  Patient is a 41 y.o. female seen for evaluation for Electroconvulsive Therapy. A little bit better  Treatment Summary:   []   Right Unilateral             [x]  Bilateral   % Energy : 1.0 ms 40%   Impedance: 2240 ohms  Seizure Energy Index: 10,930 V squared  Postictal Suppression Index: 72%  Seizure Concordance Index: 98%  Medications  Pre Shock: Brevital 70 mg succinylcholine 80 mg  Post Shock:    Seizure Duration: 49 seconds EMG 49 seconds EEG   Comments: Follow-up in about 3 weeks  Lungs:  [x]   Clear to auscultation               []  Other:   Heart:    [x]   Regular rhythm             []  irregular rhythm    [x]   Previous H&P reviewed, patient examined and there are NO CHANGES                 []   Previous H&P reviewed, patient examined and there are changes noted.   Alethia Berthold, MD 10/18/20235:50 PM

## 2022-06-04 NOTE — Anesthesia Preprocedure Evaluation (Signed)
Anesthesia Evaluation  Patient identified by MRN, date of birth, ID band Patient awake    Reviewed: Allergy & Precautions, H&P , NPO status , Patient's Chart, lab work & pertinent test results, reviewed documented beta blocker date and time   History of Anesthesia Complications (+) PONV and history of anesthetic complications  Airway Mallampati: II   Neck ROM: full    Dental  (+) Poor Dentition   Pulmonary neg pulmonary ROS, former smoker,    Pulmonary exam normal        Cardiovascular Exercise Tolerance: Good negative cardio ROS Normal cardiovascular exam Rhythm:regular Rate:Normal     Neuro/Psych  Headaches, PSYCHIATRIC DISORDERS Anxiety Depression Bipolar Disorder    GI/Hepatic Neg liver ROS, GERD  Medicated,  Endo/Other  negative endocrine ROS  Renal/GU negative Renal ROS  negative genitourinary   Musculoskeletal   Abdominal   Peds  Hematology negative hematology ROS (+)   Anesthesia Other Findings Past Medical History: No date: Anxiety No date: Anxiety No date: Bipolar 1 disorder (HCC) No date: Chiari malformation No date: Complication of anesthesia No date: Depression No date: GERD (gastroesophageal reflux disease) No date: MDD (major depressive disorder) No date: Microscopic colitis No date: Migraine headache No date: PONV (postoperative nausea and vomiting) Past Surgical History: No date: APPENDECTOMY No date: CHOLECYSTECTOMY 10/28/2021: INCISION AND DRAINAGE OF WOUND; Left     Comment:  Procedure: KNEE ARTHROSCOPY WITH IRRIGATION AND               DEBRIDEMENT;  Surgeon: Sydnee Cabal, MD;  Location: [facility];  Service: Orthopedics;  Laterality: Left;  60 okay               per OR No date: KNEE SURGERY; Left     Comment:  3 times No date: MANDIBLE SURGERY   Reproductive/Obstetrics negative OB ROS                             Anesthesia  Physical Anesthesia Plan  ASA: 3  Anesthesia Plan: General   Post-op Pain Management:    Induction:   PONV Risk Score and Plan:   Airway Management Planned:   Additional Equipment:   Intra-op Plan:   Post-operative Plan:   Informed Consent: I have reviewed the patients History and Physical, chart, labs and discussed the procedure including the risks, benefits and alternatives for the proposed anesthesia with the patient or authorized representative who has indicated his/her understanding and acceptance.     Dental Advisory Given  Plan Discussed with: CRNA  Anesthesia Plan Comments:         Anesthesia Quick Evaluation

## 2022-06-04 NOTE — Transfer of Care (Signed)
Immediate Anesthesia Transfer of Care Note  Patient: Kayla Holden  Procedure(s) Performed: ECT TX  Patient Location: PACU  Anesthesia Type:General  Level of Consciousness: awake, drowsy and patient cooperative  Airway & Oxygen Therapy: Patient Spontanous Breathing and Patient connected to face mask oxygen  Post-op Assessment: Report given to RN and Post -op Vital signs reviewed and stable  Post vital signs: Reviewed and stable  Last Vitals:  Vitals Value Taken Time  BP    Temp    Pulse    Resp    SpO2      Last Pain:  Vitals:   06/04/22 1025  TempSrc:   PainSc: 0-No pain         Complications: No notable events documented.

## 2022-06-04 NOTE — Anesthesia Procedure Notes (Addendum)
Procedure Name: General with mask airway Date/Time: 06/04/2022 12:15 PM  Performed by: Kelton Pillar, CRNAPre-anesthesia Checklist: Patient identified, Emergency Drugs available, Suction available and Patient being monitored Patient Re-evaluated:Patient Re-evaluated prior to induction Oxygen Delivery Method: Circle system utilized Preoxygenation: Pre-oxygenation with 100% oxygen Induction Type: IV induction Ventilation: Oral airway inserted - appropriate to patient size Airway Equipment and Method: Bite block Placement Confirmation: CO2 detector, positive ETCO2 and breath sounds checked- equal and bilateral Dental Injury: Teeth and Oropharynx as per pre-operative assessment

## 2022-06-05 NOTE — Anesthesia Postprocedure Evaluation (Signed)
Anesthesia Post Note  Patient: Kayla Holden  Procedure(s) Performed: ECT TX  Patient location during evaluation: PACU Anesthesia Type: General Level of consciousness: awake and alert Pain management: pain level controlled Vital Signs Assessment: post-procedure vital signs reviewed and stable Respiratory status: spontaneous breathing, nonlabored ventilation, respiratory function stable and patient connected to nasal cannula oxygen Cardiovascular status: blood pressure returned to baseline and stable Postop Assessment: no apparent nausea or vomiting Anesthetic complications: no   No notable events documented.   Last Vitals:  Vitals:   06/04/22 1245 06/04/22 1341  BP: 120/66 120/64  Pulse: 81 80  Resp: 14 14  Temp:  37.2 C  SpO2: 96%     Last Pain:  Vitals:   06/04/22 1341  TempSrc: Oral  PainSc: 0-No pain                 Molli Barrows

## 2022-06-09 DIAGNOSIS — F4310 Post-traumatic stress disorder, unspecified: Secondary | ICD-10-CM | POA: Diagnosis not present

## 2022-06-11 DIAGNOSIS — Z6825 Body mass index (BMI) 25.0-25.9, adult: Secondary | ICD-10-CM | POA: Diagnosis not present

## 2022-06-11 DIAGNOSIS — L578 Other skin changes due to chronic exposure to nonionizing radiation: Secondary | ICD-10-CM | POA: Diagnosis not present

## 2022-06-16 DIAGNOSIS — Z1231 Encounter for screening mammogram for malignant neoplasm of breast: Secondary | ICD-10-CM | POA: Diagnosis not present

## 2022-06-23 DIAGNOSIS — F419 Anxiety disorder, unspecified: Secondary | ICD-10-CM | POA: Diagnosis not present

## 2022-06-23 DIAGNOSIS — F314 Bipolar disorder, current episode depressed, severe, without psychotic features: Secondary | ICD-10-CM | POA: Diagnosis not present

## 2022-06-24 ENCOUNTER — Other Ambulatory Visit: Payer: Self-pay | Admitting: Psychiatry

## 2022-06-24 DIAGNOSIS — R200 Anesthesia of skin: Secondary | ICD-10-CM

## 2022-06-25 ENCOUNTER — Encounter
Admission: RE | Admit: 2022-06-25 | Discharge: 2022-06-25 | Disposition: A | Discharge status: Home / Self Care | Payer: PPO | Attending: Psychiatry | Admitting: Psychiatry

## 2022-06-25 ENCOUNTER — Encounter: Payer: Self-pay | Admitting: Certified Registered Nurse Anesthetist

## 2022-06-25 DIAGNOSIS — Z87891 Personal history of nicotine dependence: Secondary | ICD-10-CM | POA: Insufficient documentation

## 2022-06-25 DIAGNOSIS — F332 Major depressive disorder, recurrent severe without psychotic features: Secondary | ICD-10-CM | POA: Insufficient documentation

## 2022-06-25 DIAGNOSIS — R200 Anesthesia of skin: Secondary | ICD-10-CM | POA: Diagnosis present

## 2022-06-25 DIAGNOSIS — T7840XA Allergy, unspecified, initial encounter: Secondary | ICD-10-CM | POA: Diagnosis not present

## 2022-06-25 MED ORDER — MIDAZOLAM HCL 2 MG/2ML IJ SOLN
INTRAMUSCULAR | Status: DC | PRN
  Administered 2022-06-25: 2 mg via INTRAVENOUS

## 2022-06-25 MED ORDER — SODIUM CHLORIDE 0.9 % IV SOLN: 500.0000 mL | Freq: Once | INTRAVENOUS | Status: AC

## 2022-06-25 MED ORDER — KETOROLAC TROMETHAMINE 30 MG/ML IJ SOLN
30.0000 mg | Freq: Once | INTRAMUSCULAR | Status: AC
  Administered 2022-06-25: 30 mg via INTRAVENOUS

## 2022-06-25 MED ORDER — METHOHEXITAL SODIUM 100 MG/10ML IV SOSY
PREFILLED_SYRINGE | INTRAVENOUS | Status: DC | PRN
  Administered 2022-06-25: 70 mg via INTRAVENOUS

## 2022-06-25 MED ORDER — DEXAMETHASONE SODIUM PHOSPHATE 10 MG/ML IJ SOLN
INTRAMUSCULAR | Status: AC
  Filled 2022-06-25: qty 1

## 2022-06-25 MED ORDER — LABETALOL HCL 5 MG/ML IV SOLN
INTRAVENOUS | Status: DC | PRN
  Administered 2022-06-25: 5 mg via INTRAVENOUS

## 2022-06-25 MED ORDER — MIDAZOLAM HCL 2 MG/2ML IJ SOLN
INTRAMUSCULAR | Status: AC
  Filled 2022-06-25: qty 2

## 2022-06-25 MED ORDER — SUCCINYLCHOLINE CHLORIDE 200 MG/10ML IV SOSY
PREFILLED_SYRINGE | INTRAVENOUS | Status: DC | PRN
  Administered 2022-06-25: 80 mg via INTRAVENOUS

## 2022-06-25 MED ORDER — ONDANSETRON HCL 4 MG/2ML IJ SOLN
INTRAMUSCULAR | Status: AC
  Filled 2022-06-25: qty 2

## 2022-06-25 MED ORDER — SUCCINYLCHOLINE CHLORIDE 200 MG/10ML IV SOSY
PREFILLED_SYRINGE | INTRAVENOUS | Status: AC
  Filled 2022-06-25: qty 10

## 2022-06-25 NOTE — H&P (Signed)
Kayla Holden is an 41 y.o. female.   Chief Complaint: Stable.  No active suicidal thought but chronically lacking enjoyment HPI: Recurrent severe depression  Past Medical History:  Diagnosis Date   Anxiety    Anxiety    Bipolar 1 disorder (HCC)    Chiari malformation    Complication of anesthesia    Depression    GERD (gastroesophageal reflux disease)    MDD (major depressive disorder)    Microscopic colitis    Migraine headache    PONV (postoperative nausea and vomiting)     Past Surgical History:  Procedure Laterality Date   APPENDECTOMY     CHOLECYSTECTOMY     INCISION AND DRAINAGE OF WOUND Left 10/28/2021   Procedure: KNEE ARTHROSCOPY WITH IRRIGATION AND DEBRIDEMENT;  Surgeon: Eugenia Mcalpine, MD;  Location: [facility];  Service: Orthopedics;  Laterality: Left;  60 okay per OR   KNEE SURGERY Left    3 times   MANDIBLE SURGERY      Family History  Problem Relation Age of Onset   Cancer Maternal Grandmother    Cancer Paternal Grandfather    Allergic rhinitis Neg Hx    Angioedema Neg Hx    Asthma Neg Hx    Atopy Neg Hx    Eczema Neg Hx    Urticaria Neg Hx    Immunodeficiency Neg Hx    Social History:  reports that she has quit smoking. Her smoking use included cigarettes. She has never used smokeless tobacco. She reports current drug use. Drug: Marijuana. She reports that she does not drink alcohol.  Allergies:  Allergies  Allergen Reactions   Codeine Other (See Comments)    Patient has "CYP2D6," so NO CODEINE   Fioricet-Codeine [Butalbital-Apap-Caff-Cod] Other (See Comments)    Patient has "CYP2D6" and cannot tolerate codeine, palpitations, rapid heart rate   Isoniazid Shortness Of Breath   Ketorolac Tromethamine Other (See Comments)    Abdominal Pain if given PO. It has to be given IV   Rifampin Shortness Of Breath and Hives   Tape Other (See Comments)    Paper tape causes blisters    Clindamycin Diarrhea and Other (See Comments)    Abdominal pain, also    Ketoprofen Other (See Comments)    Abdominal pain and cannot take due to history of colitis   Nsaids Other (See Comments)    History of colitis    (Not in a hospital admission)   No results found for this or any previous visit (from the past 48 hour(s)). No results found.  Review of Systems  Constitutional: Negative.   HENT: Negative.    Eyes: Negative.   Respiratory: Negative.    Cardiovascular: Negative.   Gastrointestinal: Negative.   Musculoskeletal: Negative.   Skin: Negative.   Neurological: Negative.   Psychiatric/Behavioral:  Positive for dysphoric mood. Negative for suicidal ideas.     Blood pressure (!) 128/92, pulse 100, temperature 99.1 F (37.3 C), temperature source Oral, resp. rate 18, height 5\' 3"  (1.6 m), SpO2 96 %. Physical Exam Vitals and nursing note reviewed.  Constitutional:      Appearance: She is well-developed.  HENT:     Head: Normocephalic and atraumatic.  Eyes:     Conjunctiva/sclera: Conjunctivae normal.     Pupils: Pupils are equal, round, and reactive to light.  Cardiovascular:     Heart sounds: Normal heart sounds.  Pulmonary:     Effort: Pulmonary effort is normal.  Abdominal:     Palpations: Abdomen is  soft.  Musculoskeletal:        General: Normal range of motion.     Cervical back: Normal range of motion.  Skin:    General: Skin is warm and dry.  Neurological:     General: No focal deficit present.     Mental Status: She is alert.  Psychiatric:        Mood and Affect: Mood normal.        Thought Content: Thought content normal.        Judgment: Judgment normal.      Assessment/Plan Patient feels ECT is helpful.  ECT today follow-up in about 4 weeks  Alethia Berthold, MD 06/25/2022, 4:24 PM

## 2022-06-25 NOTE — Anesthesia Procedure Notes (Signed)
Procedure Name: General with mask airway Date/Time: 06/25/2022 12:37 PM  Performed by: Hezzie Bump, CRNAPre-anesthesia Checklist: Patient identified, Emergency Drugs available, Suction available and Patient being monitored Patient Re-evaluated:Patient Re-evaluated prior to induction Oxygen Delivery Method: Circle system utilized Preoxygenation: Pre-oxygenation with 100% oxygen Induction Type: IV induction Ventilation: Mask ventilation without difficulty and Mask ventilation throughout procedure Airway Equipment and Method: Bite block Placement Confirmation: positive ETCO2 Dental Injury: Teeth and Oropharynx as per pre-operative assessment

## 2022-06-25 NOTE — Transfer of Care (Signed)
Immediate Anesthesia Transfer of Care Note  Patient: Kayla Holden  Procedure(s) Performed: ECT TX  Patient Location: PACU  Anesthesia Type:General  Level of Consciousness: drowsy  Airway & Oxygen Therapy: Patient Spontanous Breathing  Post-op Assessment: Report given to RN and Post -op Vital signs reviewed and stable  Post vital signs: Reviewed and stable  Last Vitals:  Vitals Value Taken Time  BP    Temp    Pulse    Resp    SpO2      Last Pain:  Vitals:   06/25/22 1128  TempSrc:   PainSc: 0-No pain         Complications: No notable events documented.

## 2022-06-25 NOTE — Procedures (Signed)
ECT SERVICES Physician's Interval Evaluation & Treatment Note  Patient Identification: Kayla Holden MRN:  [id number] Date of Evaluation:  06/25/2022 TX #: 11  MADRS:   MMSE:   P.E. Findings:  No change to physical exam  Psychiatric Interval Note:  Mood continues to be dysphoric  Subjective:  Patient is a 41 y.o. female seen for evaluation for Electroconvulsive Therapy. Depressed but not psychotic no active suicidal thoughts  Treatment Summary:   []   Right Unilateral             [x]  Bilateral   % Energy : 1.0 ms 40%   Impedance: 2590 ohms  Seizure Energy Index: 31,680 V squared  Postictal Suppression Index: Less than 10%  Seizure Concordance Index: 91%  Medications  Pre Shock: Toradol 30 mg Brevital 70 mg succinylcholine 80 mg  Post Shock: None  Seizure Duration: 49 seconds EMG 68 seconds EEG   Comments: Follow-up 4 weeks  Lungs:  [x]   Clear to auscultation               []  Other:   Heart:    [x]   Regular rhythm             []  irregular rhythm    [x]   Previous H&P reviewed, patient examined and there are NO CHANGES                 []   Previous H&P reviewed, patient examined and there are changes noted.   , MD 11/8/20234:26 PM

## 2022-06-25 NOTE — Anesthesia Preprocedure Evaluation (Signed)
Anesthesia Evaluation  Patient identified by MRN, date of birth, ID band Patient awake    Reviewed: Allergy & Precautions, H&P , NPO status , Patient's Chart, lab work & pertinent test results, reviewed documented beta blocker date and time   History of Anesthesia Complications (+) PONV and history of anesthetic complications  Airway Mallampati: II   Neck ROM: full    Dental  (+) Poor Dentition   Pulmonary neg pulmonary ROS, former smoker   Pulmonary exam normal        Cardiovascular Exercise Tolerance: Good negative cardio ROS Normal cardiovascular exam Rhythm:regular Rate:Normal     Neuro/Psych  Headaches PSYCHIATRIC DISORDERS Anxiety Depression Bipolar Disorder      GI/Hepatic Neg liver ROS,GERD  Medicated,,  Endo/Other  negative endocrine ROS    Renal/GU negative Renal ROS  negative genitourinary   Musculoskeletal   Abdominal   Peds  Hematology negative hematology ROS (+)   Anesthesia Other Findings Past Medical History: No date: Anxiety No date: Anxiety No date: Bipolar 1 disorder (HCC) No date: Chiari malformation No date: Complication of anesthesia No date: Depression No date: GERD (gastroesophageal reflux disease) No date: MDD (major depressive disorder) No date: Microscopic colitis No date: Migraine headache No date: PONV (postoperative nausea and vomiting) Past Surgical History: No date: APPENDECTOMY No date: CHOLECYSTECTOMY 10/28/2021: INCISION AND DRAINAGE OF WOUND; Left     Comment:  Procedure: KNEE ARTHROSCOPY WITH IRRIGATION AND               DEBRIDEMENT;  Surgeon: Eugenia Mcalpine, MD;  Location: [facility];  Service: Orthopedics;  Laterality: Left;  60 okay               per OR No date: KNEE SURGERY; Left     Comment:  3 times No date: MANDIBLE SURGERY   Reproductive/Obstetrics negative OB ROS                              Anesthesia  Physical Anesthesia Plan  ASA: 3  Anesthesia Plan: General   Post-op Pain Management:    Induction:   PONV Risk Score and Plan:   Airway Management Planned:   Additional Equipment:   Intra-op Plan:   Post-operative Plan:   Informed Consent: I have reviewed the patients History and Physical, chart, labs and discussed the procedure including the risks, benefits and alternatives for the proposed anesthesia with the patient or authorized representative who has indicated his/her understanding and acceptance.     Dental Advisory Given  Plan Discussed with: CRNA  Anesthesia Plan Comments:          Anesthesia Quick Evaluation

## 2022-07-01 DIAGNOSIS — Z79899 Other long term (current) drug therapy: Secondary | ICD-10-CM | POA: Diagnosis not present

## 2022-07-05 IMAGING — CT CT ANKLE*R* W/O CM
3 series · 11 of 33 positions shown, 13 images · non-contrast
Comparison: MRI 04/10/2017

CLINICAL DATA: Eval fusion

EXAM:
CT OF THE RIGHT ANKLE WITHOUT CONTRAST
TECHNIQUE: Multidetector CT imaging of the right ankle was performed according
to the standard protocol. Multiplanar CT image reconstructions were
also generated.

[Series 5: sfov lower extremity right ankle 2.00 br40 s3 soft · axial · 0.19mm/px · z∈[+630,+741]mm · 3 of 93 slices shown, 4 images (1 of 3)]
[im 22/93  soft-tissue]
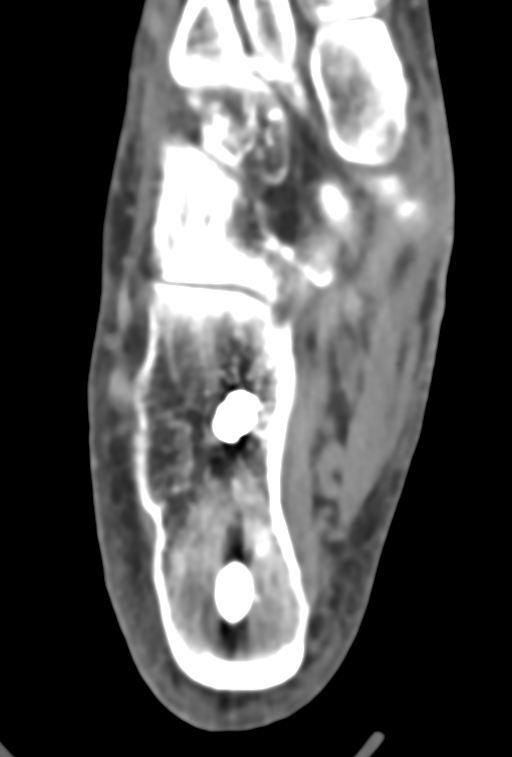
[im 22/93  bone]
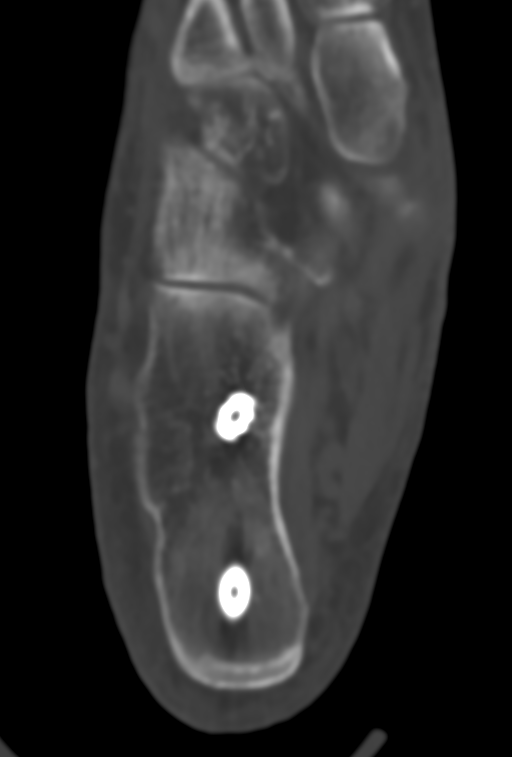
[im 50/93  bone]
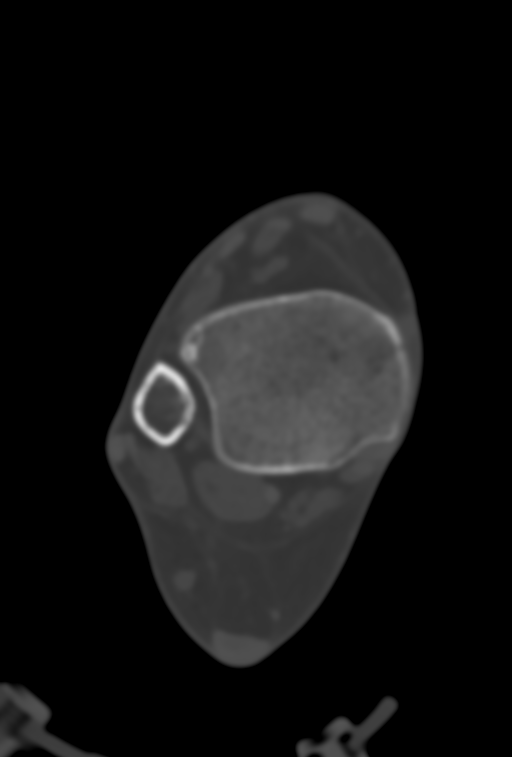
[im 78/93  bone]
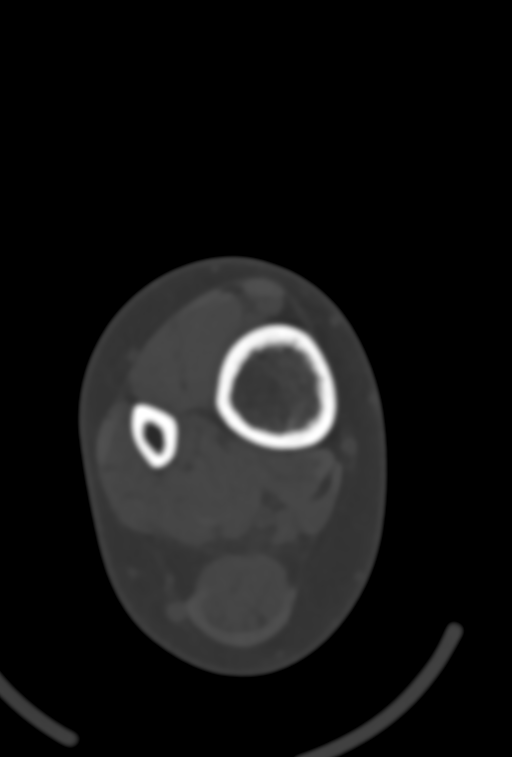

[Series 9: sfov lower extremity right ankle 2.00 br40 s3 soft · coronal · 0.17mm/px · 3 of 80 slices shown (2 of 3)]
[im 16/80  bone]
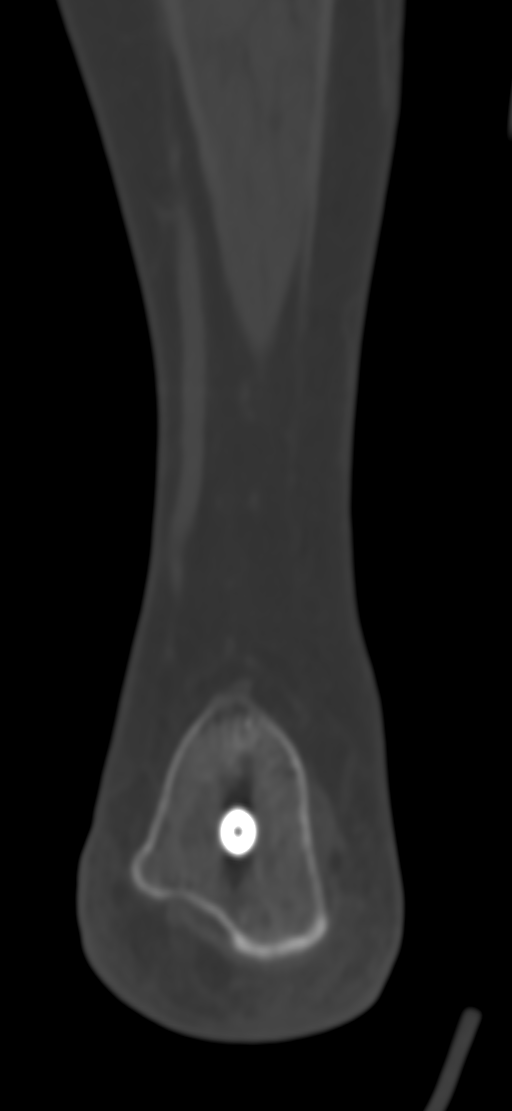
[im 32/80  bone]
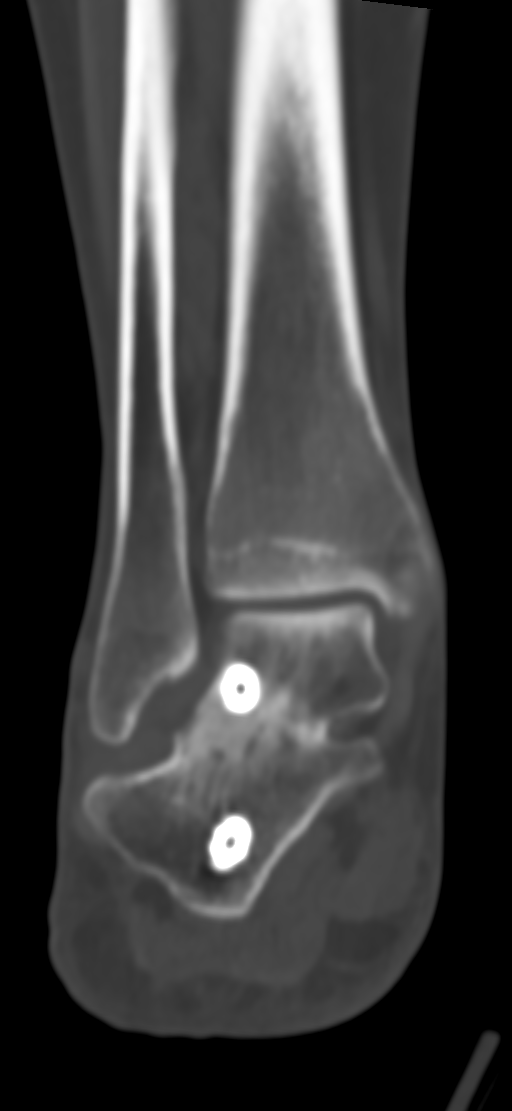
[im 48/80  bone]
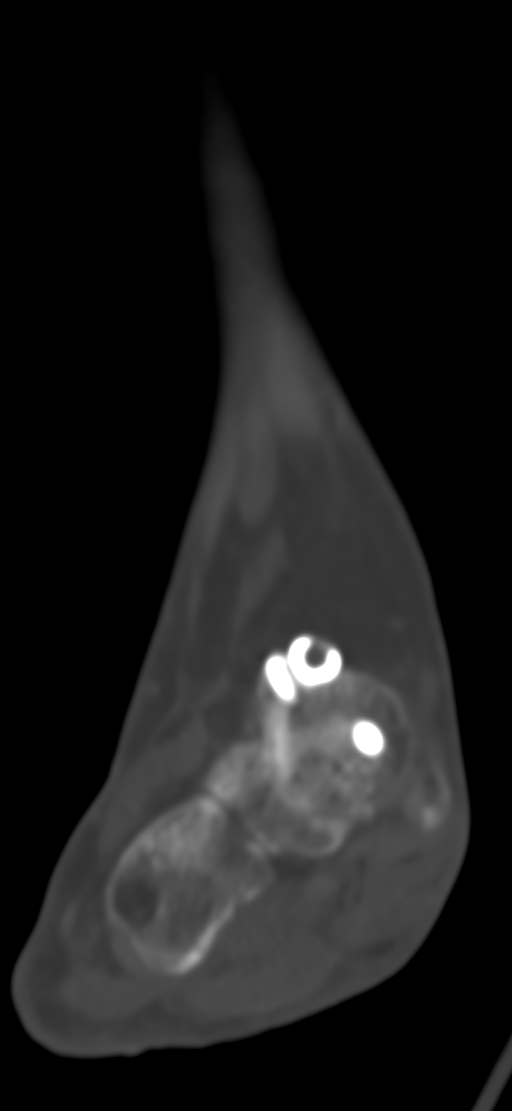

[Series 13: sfov lower extremity right ankle 2.00 br40 s3 soft · sagittal · 0.28mm/px · 5 of 47 slices shown, 6 images (3 of 3)]
[im 16/47  bone]
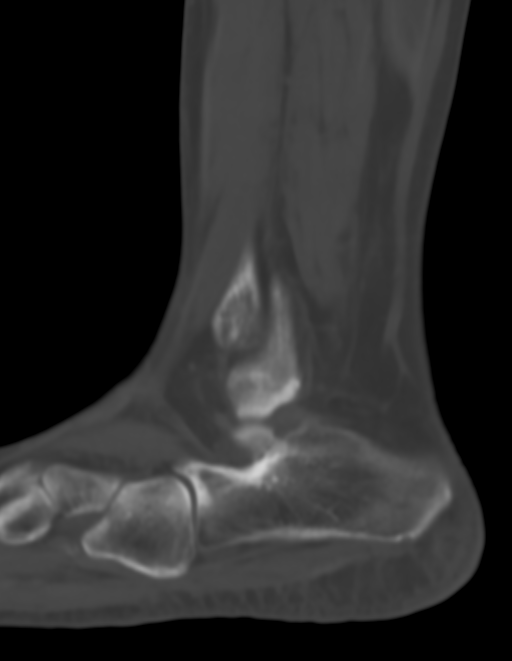
[im 20/47  bone]
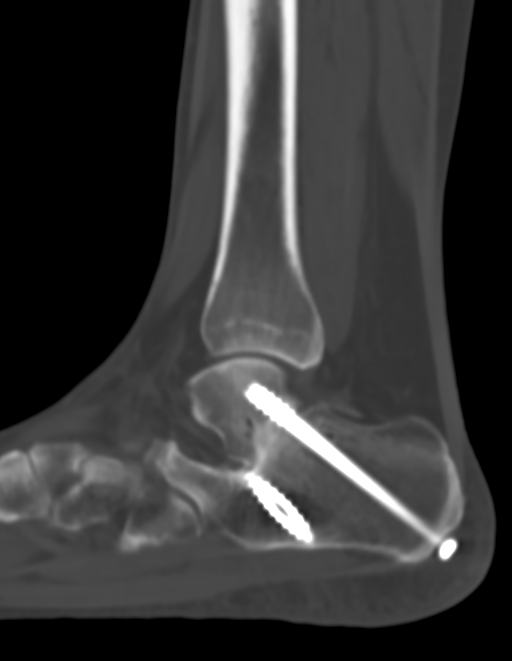
[im 24/47  soft-tissue]
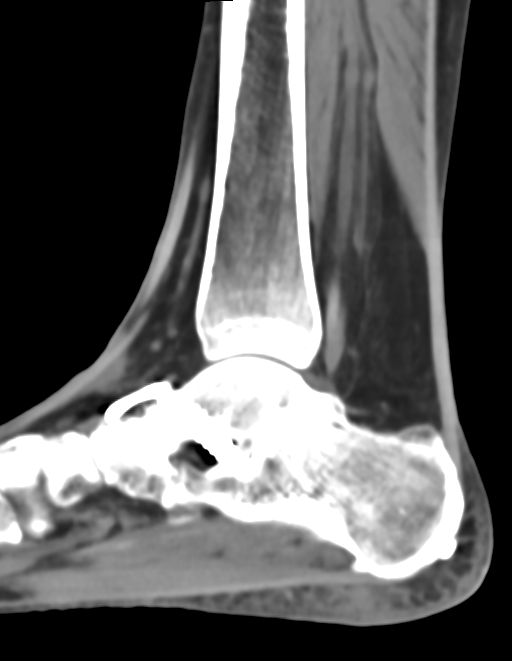
[im 24/47  bone]
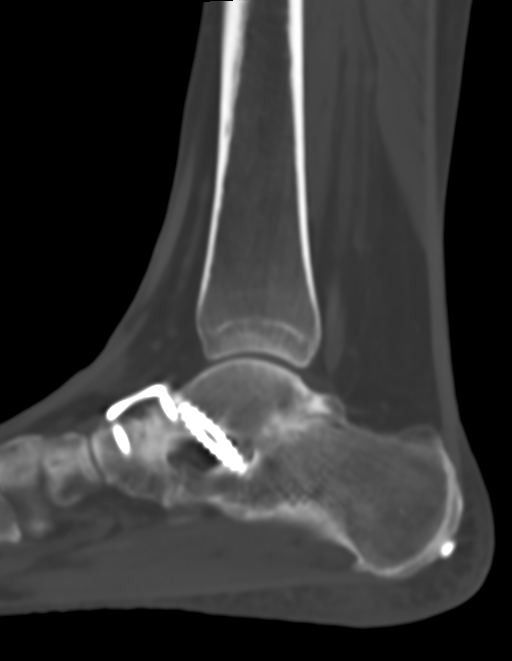
[im 27/47  bone]
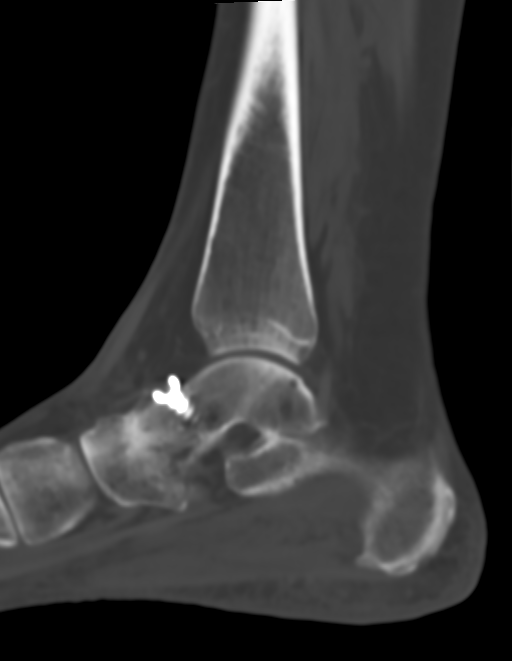
[im 31/47  bone]
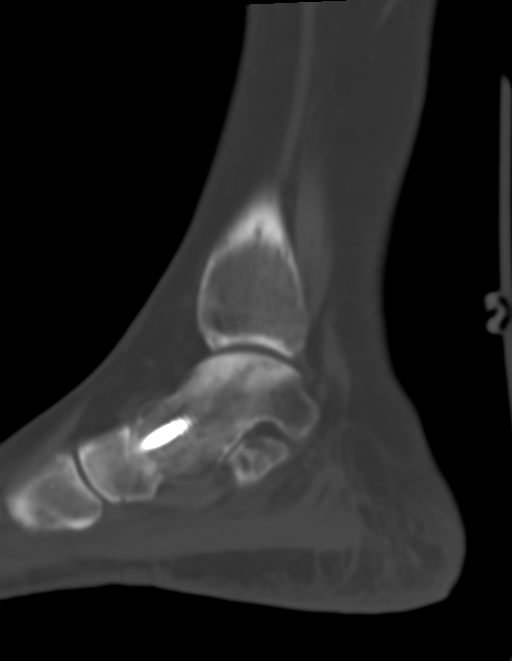

[11 of 33 positions shown; findings below may reference images not displayed]

FINDINGS: Bones/Joint/Cartilage

Postsurgical changes of subtalar and talonavicular fusion with
subtalar screws and talonavicular dorsal staple. There is near
complete solid bony fusion across the joints. Hardware is intact
without evidence of loosening or fracture. Fibro-osseous
calcaneonavicular coalition. There is no evidence of acute fracture.
Bony fusion across the joints.

Ligaments

Suboptimally assessed by CT.

Muscles and Tendons

There is mild edema along the extensor hallucis longus tendon, which
otherwise appears intact on noncontrast CT (sagittal series 11,
image 24). No other tendon abnormality appreciated.

Soft tissues

No additional findings.
IMPRESSION: Near complete solid bony fusion across the subtalar and
talonavicular fusions. No evidence of hardware complication.

Fibro-osseous calcaneonavicular coalition.

Mild edema along the extensor hallucis longus tendon.

No acute osseous abnormality.

## 2022-07-07 DIAGNOSIS — F4310 Post-traumatic stress disorder, unspecified: Secondary | ICD-10-CM | POA: Diagnosis not present

## 2022-07-07 NOTE — Anesthesia Postprocedure Evaluation (Signed)
Anesthesia Post Note  Patient: Kayla Holden  Procedure(s) Performed: ECT TX  Patient location during evaluation: PACU Anesthesia Type: General Level of consciousness: awake and alert Pain management: pain level controlled Vital Signs Assessment: post-procedure vital signs reviewed and stable Respiratory status: spontaneous breathing, nonlabored ventilation, respiratory function stable and patient connected to nasal cannula oxygen Cardiovascular status: blood pressure returned to baseline and stable Postop Assessment: no apparent nausea or vomiting Anesthetic complications: no   No notable events documented.   Last Vitals:  Vitals:   06/25/22 1315 06/25/22 1322  BP: (!) 129/95 (!) 128/92  Pulse: (!) 101 100  Resp: 18 18  Temp: 37.3 C 37.3 C  SpO2: 96%     Last Pain:  Vitals:   06/25/22 1322  TempSrc: Oral  PainSc: 0-No pain                 Lenard Simmer

## 2022-07-27 ENCOUNTER — Other Ambulatory Visit: Payer: Self-pay | Admitting: Psychiatry

## 2022-07-28 ENCOUNTER — Encounter
Admission: RE | Admit: 2022-07-28 | Discharge: 2022-07-28 | Disposition: A | Discharge status: Home / Self Care | Payer: PPO | Attending: Psychiatry | Admitting: Psychiatry

## 2022-07-28 ENCOUNTER — Ambulatory Visit: Payer: Self-pay | Admitting: Anesthesiology

## 2022-07-28 ENCOUNTER — Encounter: Payer: Self-pay | Admitting: Anesthesiology

## 2022-07-28 DIAGNOSIS — R200 Anesthesia of skin: Secondary | ICD-10-CM | POA: Diagnosis not present

## 2022-07-28 DIAGNOSIS — F332 Major depressive disorder, recurrent severe without psychotic features: Secondary | ICD-10-CM | POA: Diagnosis not present

## 2022-07-28 DIAGNOSIS — F319 Bipolar disorder, unspecified: Secondary | ICD-10-CM | POA: Insufficient documentation

## 2022-07-28 DIAGNOSIS — F1290 Cannabis use, unspecified, uncomplicated: Secondary | ICD-10-CM | POA: Diagnosis not present

## 2022-07-28 DIAGNOSIS — F419 Anxiety disorder, unspecified: Secondary | ICD-10-CM | POA: Insufficient documentation

## 2022-07-28 DIAGNOSIS — R519 Headache, unspecified: Secondary | ICD-10-CM | POA: Diagnosis not present

## 2022-07-28 DIAGNOSIS — Z87891 Personal history of nicotine dependence: Secondary | ICD-10-CM | POA: Diagnosis not present

## 2022-07-28 MED ORDER — KETOROLAC TROMETHAMINE 30 MG/ML IJ SOLN: 30.0000 mg | Freq: Once | INTRAMUSCULAR | Status: AC

## 2022-07-28 MED ORDER — LABETALOL HCL 5 MG/ML IV SOLN
INTRAVENOUS | Status: AC
  Filled 2022-07-28: qty 4

## 2022-07-28 MED ORDER — KETAMINE HCL 50 MG/5ML IJ SOSY
PREFILLED_SYRINGE | INTRAMUSCULAR | Status: AC
  Filled 2022-07-28: qty 5

## 2022-07-28 MED ORDER — SODIUM CHLORIDE 0.9 % IV SOLN: INTRAVENOUS | Status: DC | PRN

## 2022-07-28 MED ORDER — MIDAZOLAM HCL 2 MG/2ML IJ SOLN
INTRAMUSCULAR | Status: DC | PRN
  Administered 2022-07-28: 2 mg via INTRAVENOUS

## 2022-07-28 MED ORDER — PROPOFOL 10 MG/ML IV BOLUS
INTRAVENOUS | Status: DC | PRN
  Administered 2022-07-28: 40 mg via INTRAVENOUS

## 2022-07-28 MED ORDER — KETOROLAC TROMETHAMINE 30 MG/ML IJ SOLN
INTRAMUSCULAR | Status: AC
  Administered 2022-07-28: 30 mg via INTRAVENOUS
  Filled 2022-07-28: qty 1

## 2022-07-28 MED ORDER — SODIUM CHLORIDE 0.9 % IV SOLN
500.0000 mL | Freq: Once | INTRAVENOUS | Status: AC
  Administered 2022-07-28: 500 mL via INTRAVENOUS

## 2022-07-28 MED ORDER — MIDAZOLAM HCL 2 MG/2ML IJ SOLN
INTRAMUSCULAR | Status: AC
  Filled 2022-07-28: qty 2

## 2022-07-28 MED ORDER — LABETALOL HCL 5 MG/ML IV SOLN
INTRAVENOUS | Status: DC | PRN
  Administered 2022-07-28: 5 mg via INTRAVENOUS

## 2022-07-28 MED ORDER — SUCCINYLCHOLINE CHLORIDE 200 MG/10ML IV SOSY
PREFILLED_SYRINGE | INTRAVENOUS | Status: DC | PRN
  Administered 2022-07-28: 80 mg via INTRAVENOUS

## 2022-07-28 MED ORDER — KETAMINE HCL 10 MG/ML IJ SOLN
INTRAMUSCULAR | Status: DC | PRN
  Administered 2022-07-28: 40 mg via INTRAVENOUS

## 2022-07-28 NOTE — H&P (Signed)
Kayla Holden is an 41 y.o. female.   Chief Complaint: anxiety and depression HPI: ongoing anxiety and depression  Past Medical History:  Diagnosis Date   Anxiety    Anxiety    Bipolar 1 disorder (HCC)    Chiari malformation    Complication of anesthesia    Depression    GERD (gastroesophageal reflux disease)    MDD (major depressive disorder)    Microscopic colitis    Migraine headache    PONV (postoperative nausea and vomiting)     Past Surgical History:  Procedure Laterality Date   APPENDECTOMY     CHOLECYSTECTOMY     INCISION AND DRAINAGE OF WOUND Left 10/28/2021   Procedure: KNEE ARTHROSCOPY WITH IRRIGATION AND DEBRIDEMENT;  Surgeon: Eugenia Mcalpine, MD;  Location: [facility];  Service: Orthopedics;  Laterality: Left;  60 okay per OR   KNEE SURGERY Left    3 times   MANDIBLE SURGERY      Family History  Problem Relation Age of Onset   Cancer Maternal Grandmother    Cancer Paternal Grandfather    Allergic rhinitis Neg Hx    Angioedema Neg Hx    Asthma Neg Hx    Atopy Neg Hx    Eczema Neg Hx    Urticaria Neg Hx    Immunodeficiency Neg Hx    Social History:  reports that she has quit smoking. Her smoking use included cigarettes. She has never used smokeless tobacco. She reports current drug use. Drug: Marijuana. She reports that she does not drink alcohol.  Allergies:  Allergies  Allergen Reactions   Codeine Other (See Comments)    Patient has "CYP2D6," so NO CODEINE   Fioricet-Codeine [Butalbital-Apap-Caff-Cod] Other (See Comments)    Patient has "CYP2D6" and cannot tolerate codeine, palpitations, rapid heart rate   Isoniazid Shortness Of Breath   Ketorolac Tromethamine Other (See Comments)    Abdominal Pain if given PO. It has to be given IV   Rifampin Shortness Of Breath and Hives   Tape Other (See Comments)    Paper tape causes blisters    Clindamycin Diarrhea and Other (See Comments)    Abdominal pain, also   Ketoprofen Other (See Comments)     Abdominal pain and cannot take due to history of colitis   Nsaids Other (See Comments)    History of colitis    (Not in a hospital admission)   No results found for this or any previous visit (from the past 48 hour(s)). No results found.  Review of Systems  Constitutional: Negative.   HENT: Negative.    Eyes: Negative.   Respiratory: Negative.    Cardiovascular: Negative.   Gastrointestinal: Negative.   Musculoskeletal: Negative.   Skin: Negative.   Neurological: Negative.   Psychiatric/Behavioral:  Positive for dysphoric mood. The patient is nervous/anxious.     Blood pressure (!) 137/99, pulse 74, temperature 98.5 F (36.9 C), temperature source Oral, resp. rate 18, height 5\' 3"  (1.6 m), weight 64 kg, SpO2 100 %. Physical Exam Vitals and nursing note reviewed.  Constitutional:      Appearance: She is well-developed.  HENT:     Head: Normocephalic and atraumatic.  Eyes:     Conjunctiva/sclera: Conjunctivae normal.     Pupils: Pupils are equal, round, and reactive to light.  Cardiovascular:     Heart sounds: Normal heart sounds.  Pulmonary:     Effort: Pulmonary effort is normal.  Abdominal:     Palpations: Abdomen is soft.  Musculoskeletal:  General: Normal range of motion.     Cervical back: Normal range of motion.  Skin:    General: Skin is warm and dry.  Neurological:     General: No focal deficit present.     Mental Status: She is alert.  Psychiatric:        Attention and Perception: Attention normal.        Mood and Affect: Mood is anxious and depressed.        Speech: Speech normal.        Behavior: Behavior normal.        Thought Content: Thought content normal.        Cognition and Memory: Cognition normal.      Assessment/Plan Treatment today and wednesday  Mordecai Rasmussen, MD 07/28/2022, 12:03 PM

## 2022-07-28 NOTE — Anesthesia Postprocedure Evaluation (Signed)
Anesthesia Post Note  Patient: Kayla Holden  Procedure(s) Performed: ECT TX  Patient location during evaluation: PACU Anesthesia Type: General Level of consciousness: awake and alert Pain management: pain level controlled Vital Signs Assessment: post-procedure vital signs reviewed and stable Respiratory status: spontaneous breathing, nonlabored ventilation, respiratory function stable and patient connected to nasal cannula oxygen Cardiovascular status: blood pressure returned to baseline and stable Postop Assessment: no apparent nausea or vomiting Anesthetic complications: no   No notable events documented.   Last Vitals:  Vitals:   07/28/22 1300 07/28/22 1311  BP: (!) 140/91 123/82  Pulse: 87 81  Resp: 15 16  Temp:    SpO2: 98% 98%    Last Pain:  Vitals:   07/28/22 1311  TempSrc:   PainSc: 0-No pain                 Cleda Mccreedy Cynda Soule

## 2022-07-28 NOTE — Anesthesia Preprocedure Evaluation (Signed)
Anesthesia Evaluation  Patient identified by MRN, date of birth, ID band Patient awake    Reviewed: Allergy & Precautions, NPO status , Patient's Chart, lab work & pertinent test results  History of Anesthesia Complications (+) PONV and history of anesthetic complications  Airway Mallampati: III  TM Distance: >3 FB Neck ROM: full    Dental  (+) Chipped   Pulmonary neg shortness of breath, former smoker   Pulmonary exam normal        Cardiovascular Exercise Tolerance: Good (-) angina negative cardio ROS Normal cardiovascular exam     Neuro/Psych  Headaches PSYCHIATRIC DISORDERS         GI/Hepatic Neg liver ROS,GERD  Controlled,,  Endo/Other  negative endocrine ROS    Renal/GU negative Renal ROS  negative genitourinary   Musculoskeletal   Abdominal   Peds  Hematology negative hematology ROS (+)   Anesthesia Other Findings Past Medical History: No date: Anxiety No date: Anxiety No date: Bipolar 1 disorder (HCC) No date: Chiari malformation No date: Complication of anesthesia No date: Depression No date: GERD (gastroesophageal reflux disease) No date: MDD (major depressive disorder) No date: Microscopic colitis No date: Migraine headache No date: PONV (postoperative nausea and vomiting)  Past Surgical History: No date: APPENDECTOMY No date: CHOLECYSTECTOMY 10/28/2021: INCISION AND DRAINAGE OF WOUND; Left     Comment:  Procedure: KNEE ARTHROSCOPY WITH IRRIGATION AND               DEBRIDEMENT;  Surgeon: Eugenia Mcalpine, MD;  Location: [facility];  Service: Orthopedics;  Laterality: Left;  60 okay               per OR No date: KNEE SURGERY; Left     Comment:  3 times No date: MANDIBLE SURGERY  BMI    Body Mass Index: 24.98 kg/m      Reproductive/Obstetrics negative OB ROS                              Anesthesia Physical Anesthesia Plan  ASA: 2  Anesthesia Plan:  General   Post-op Pain Management:    Induction: Intravenous  PONV Risk Score and Plan: Propofol infusion and TIVA  Airway Management Planned: Mask  Additional Equipment:   Intra-op Plan:   Post-operative Plan:   Informed Consent: I have reviewed the patients History and Physical, chart, labs and discussed the procedure including the risks, benefits and alternatives for the proposed anesthesia with the patient or authorized representative who has indicated his/her understanding and acceptance.     Dental Advisory Given  Plan Discussed with: Anesthesiologist, CRNA and Surgeon  Anesthesia Plan Comments: (Patient consented for risks of anesthesia including but not limited to:  - adverse reactions to medications - risk of airway placement if required - damage to eyes, teeth, lips or other oral mucosa - nerve damage due to positioning  - sore throat or hoarseness - Damage to heart, brain, nerves, lungs, other parts of body or loss of life  Patient voiced understanding.)         Anesthesia Quick Evaluation

## 2022-07-28 NOTE — Transfer of Care (Signed)
Immediate Anesthesia Transfer of Care Note  Patient: Kayla Holden  Procedure(s) Performed: ECT TX  Patient Location: PACU  Anesthesia Type:General  Level of Consciousness: awake, alert , and oriented  Airway & Oxygen Therapy: Patient Spontanous Breathing  Post-op Assessment: Report given to RN and Post -op Vital signs reviewed and stable  Post vital signs: Reviewed and stable  Last Vitals:  Vitals Value Taken Time  BP 157/100 07/28/22 1236  Temp 37.4 C 07/28/22 1236  Pulse 95 07/28/22 1236  Resp 16 07/28/22 1236  SpO2 96 % 07/28/22 1236  Vitals shown include unvalidated device data.  Last Pain:  Vitals:   07/28/22 1128  TempSrc:   PainSc: 0-No pain         Complications: No notable events documented.

## 2022-07-28 NOTE — Procedures (Signed)
ECT SERVICES Physician's Interval Evaluation & Treatment Note  Patient Identification: Kayla Holden MRN:  [id number] Date of Evaluation:  07/28/2022 TX #: 12  MADRS:   MMSE:   P.E. Findings:  No sign of any change to physical exam  Psychiatric Interval Note:  Patient continues to report general sense of malaise and unhappiness with chronic anxiety and depression  Subjective:  Patient is a 41 y.o. female seen for evaluation for Electroconvulsive Therapy. Continues to report chronic anxiety and depression although she also insists that after ECT treatments she feels better  Treatment Summary:   []   Right Unilateral             [x]  Bilateral   % Energy : 1.0 ms 40%   Impedance: 2430 ohms  Seizure Energy Index: 33,547 V squared  Postictal Suppression Index: 86%  Seizure Concordance Index: 99%  Medications  Pre Shock: Toradol 30 mg propofol 40 mg ketamine 40 mg succinylcholine 80 mg  Post Shock: None  Seizure Duration: 45 seconds EMG 46 seconds EEG   Comments: Patient is requesting 2 treatments in a row and so will be seen on Wednesday  Lungs:  [x]   Clear to auscultation               []  Other:   Heart:    [x]   Regular rhythm             []  irregular rhythm    [x]   Previous H&P reviewed, patient examined and there are NO CHANGES                 []   Previous H&P reviewed, patient examined and there are changes noted.   , MD 12/11/20233:13 PM

## 2022-07-29 ENCOUNTER — Other Ambulatory Visit: Payer: Self-pay | Admitting: Psychiatry

## 2022-07-29 DIAGNOSIS — R200 Anesthesia of skin: Secondary | ICD-10-CM

## 2022-07-30 ENCOUNTER — Encounter (HOSPITAL_BASED_OUTPATIENT_CLINIC_OR_DEPARTMENT_OTHER)
Admission: RE | Admit: 2022-07-30 | Discharge: 2022-07-30 | Disposition: A | Discharge status: Home / Self Care | Payer: PPO | Source: Ambulatory Visit | Attending: Psychiatry | Admitting: Psychiatry

## 2022-07-30 ENCOUNTER — Encounter: Payer: Self-pay | Admitting: Anesthesiology

## 2022-07-30 DIAGNOSIS — F319 Bipolar disorder, unspecified: Secondary | ICD-10-CM | POA: Diagnosis not present

## 2022-07-30 DIAGNOSIS — R200 Anesthesia of skin: Secondary | ICD-10-CM

## 2022-07-30 DIAGNOSIS — F332 Major depressive disorder, recurrent severe without psychotic features: Secondary | ICD-10-CM | POA: Diagnosis not present

## 2022-07-30 DIAGNOSIS — F418 Other specified anxiety disorders: Secondary | ICD-10-CM | POA: Diagnosis not present

## 2022-07-30 MED ORDER — KETOROLAC TROMETHAMINE 30 MG/ML IJ SOLN
INTRAMUSCULAR | Status: AC
  Filled 2022-07-30: qty 1

## 2022-07-30 MED ORDER — SODIUM CHLORIDE 0.9 % IV SOLN
500.0000 mL | Freq: Once | INTRAVENOUS | Status: AC
  Administered 2022-07-30: 500 mL via INTRAVENOUS

## 2022-07-30 MED ORDER — MIDAZOLAM HCL 2 MG/2ML IJ SOLN
INTRAMUSCULAR | Status: AC
  Filled 2022-07-30: qty 2

## 2022-07-30 MED ORDER — SODIUM CHLORIDE 0.9 % IV SOLN: INTRAVENOUS | Status: DC | PRN

## 2022-07-30 MED ORDER — KETOROLAC TROMETHAMINE 30 MG/ML IJ SOLN
30.0000 mg | Freq: Once | INTRAMUSCULAR | Status: AC
  Administered 2022-07-30: 30 mg via INTRAVENOUS

## 2022-07-30 MED ORDER — ONDANSETRON HCL 4 MG/2ML IJ SOLN: 4.0000 mg | Freq: Once | INTRAMUSCULAR | Status: DC | PRN

## 2022-07-30 MED ORDER — SUCCINYLCHOLINE CHLORIDE 200 MG/10ML IV SOSY
PREFILLED_SYRINGE | INTRAVENOUS | Status: DC | PRN
  Administered 2022-07-30: 80 mg via INTRAVENOUS

## 2022-07-30 MED ORDER — KETAMINE HCL 50 MG/5ML IJ SOSY
PREFILLED_SYRINGE | INTRAMUSCULAR | Status: AC
  Filled 2022-07-30: qty 5

## 2022-07-30 MED ORDER — LABETALOL HCL 5 MG/ML IV SOLN
INTRAVENOUS | Status: DC | PRN
  Administered 2022-07-30: 5 mg via INTRAVENOUS

## 2022-07-30 MED ORDER — MIDAZOLAM HCL 2 MG/2ML IJ SOLN
INTRAMUSCULAR | Status: DC | PRN
  Administered 2022-07-30: 2 mg via INTRAVENOUS

## 2022-07-30 MED ORDER — PROPOFOL 500 MG/50ML IV EMUL
INTRAVENOUS | Status: DC | PRN
  Administered 2022-07-30: 40 mg via INTRAVENOUS

## 2022-07-30 MED ORDER — KETAMINE HCL 10 MG/ML IJ SOLN
INTRAMUSCULAR | Status: DC | PRN
  Administered 2022-07-30: 40 mg via INTRAVENOUS

## 2022-07-30 NOTE — Anesthesia Preprocedure Evaluation (Signed)
Anesthesia Evaluation  Patient identified by MRN, date of birth, ID band Patient awake    Reviewed: Allergy & Precautions, NPO status , Patient's Chart, lab work & pertinent test results  History of Anesthesia Complications (+) PONV and history of anesthetic complications  Airway Mallampati: III  TM Distance: >3 FB Neck ROM: full    Dental  (+) Chipped   Pulmonary neg shortness of breath, former smoker   Pulmonary exam normal        Cardiovascular Exercise Tolerance: Good (-) angina negative cardio ROS Normal cardiovascular exam     Neuro/Psych  Headaches PSYCHIATRIC DISORDERS Anxiety Depression Bipolar Disorder      GI/Hepatic ,GERD  Controlled,,Hx cytochrome deficiency, 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   Endo/Other  negative endocrine ROS    Renal/GU negative Renal ROS  negative genitourinary   Musculoskeletal   Abdominal   Peds  Hematology negative hematology ROS (+)   Anesthesia Other Findings Past Medical History: No date: Anxiety No date: Anxiety No date: Bipolar 1 disorder (HCC) No date: Chiari malformation No date: Complication of anesthesia No date: Depression No date: GERD (gastroesophageal reflux disease) No date: MDD (major depressive disorder) No date: Microscopic colitis No date: Migraine headache No date: PONV (postoperative nausea and vomiting)  Past Surgical History: No date: APPENDECTOMY No date: CHOLECYSTECTOMY 10/28/2021: INCISION AND DRAINAGE OF WOUND; Left     Comment:  Procedure: KNEE ARTHROSCOPY WITH IRRIGATION AND               DEBRIDEMENT;  Surgeon: Eugenia Mcalpine, MD;  Location: [facility];  Service: Orthopedics;  Laterality: Left;  60 okay               per OR No date: KNEE SURGERY; Left     Comment:  3 times No date: MANDIBLE SURGERY  BMI    Body Mass Index: 24.98 kg/m      Reproductive/Obstetrics negative OB ROS                               Anesthesia Physical Anesthesia Plan  ASA: 2  Anesthesia Plan: General   Post-op Pain Management:    Induction: Intravenous  PONV Risk Score and Plan: Propofol infusion and TIVA  Airway Management Planned: Mask  Additional Equipment:   Intra-op Plan:   Post-operative Plan:   Informed Consent: I have reviewed the patients History and Physical, chart, labs and discussed the procedure including the risks, benefits and alternatives for the proposed anesthesia with the patient or authorized representative who has indicated his/her understanding and acceptance.     Dental Advisory Given  Plan Discussed with: Anesthesiologist, CRNA and Surgeon  Anesthesia Plan Comments: (Patient consented for risks of anesthesia including but not limited to:  - adverse reactions to medications - risk of airway placement if required - damage to eyes, teeth, lips or other oral mucosa - nerve damage due to positioning  - sore throat or hoarseness - Damage to heart, brain, nerves, lungs, other parts of body or loss of life  Patient voiced understanding.)         Anesthesia Quick Evaluation

## 2022-07-30 NOTE — Anesthesia Procedure Notes (Signed)
Procedure Name: General with mask airway Date/Time: 07/30/2022 1:14 PM  Performed by: Stormy Fabian, CRNAPre-anesthesia Checklist: Patient identified, Emergency Drugs available, Suction available and Patient being monitored Patient Re-evaluated:Patient Re-evaluated prior to induction Preoxygenation: Pre-oxygenation with 100% oxygen Induction Type: IV induction

## 2022-07-30 NOTE — Anesthesia Postprocedure Evaluation (Signed)
Anesthesia Post Note  Patient: Kayla Holden  Procedure(s) Performed: ECT TX  Patient location during evaluation: PACU Anesthesia Type: General Level of consciousness: awake and alert Pain management: pain level controlled Vital Signs Assessment: post-procedure vital signs reviewed and stable Respiratory status: spontaneous breathing, nonlabored ventilation, respiratory function stable and patient connected to nasal cannula oxygen Cardiovascular status: blood pressure returned to baseline and stable Postop Assessment: no apparent nausea or vomiting Anesthetic complications: no   No notable events documented.   Last Vitals:  Vitals:   07/30/22 1334 07/30/22 1339  BP: 115/78 133/88  Pulse:  95  Resp: 14 16  Temp:    SpO2: 98% 94%    Last Pain:  Vitals:   07/30/22 1339  TempSrc:   PainSc: 0-No pain                 Corinda Gubler

## 2022-07-30 NOTE — H&P (Signed)
Kayla Holden is an 41 y.o. female.   Chief Complaint: a little better HPI: recurrent depression  Past Medical History:  Diagnosis Date   Anxiety    Anxiety    Bipolar 1 disorder (HCC)    Chiari malformation    Complication of anesthesia    Depression    GERD (gastroesophageal reflux disease)    MDD (major depressive disorder)    Microscopic colitis    Migraine headache    PONV (postoperative nausea and vomiting)     Past Surgical History:  Procedure Laterality Date   APPENDECTOMY     CHOLECYSTECTOMY     INCISION AND DRAINAGE OF WOUND Left 10/28/2021   Procedure: KNEE ARTHROSCOPY WITH IRRIGATION AND DEBRIDEMENT;  Surgeon: Eugenia Mcalpine, MD;  Location: [facility];  Service: Orthopedics;  Laterality: Left;  60 okay per OR   KNEE SURGERY Left    3 times   MANDIBLE SURGERY      Family History  Problem Relation Age of Onset   Cancer Maternal Grandmother    Cancer Paternal Grandfather    Allergic rhinitis Neg Hx    Angioedema Neg Hx    Asthma Neg Hx    Atopy Neg Hx    Eczema Neg Hx    Urticaria Neg Hx    Immunodeficiency Neg Hx    Social History:  reports that she has quit smoking. Her smoking use included cigarettes. She has never used smokeless tobacco. She reports current drug use. Drug: Marijuana. She reports that she does not drink alcohol.  Allergies:  Allergies  Allergen Reactions   Codeine Other (See Comments)    Patient has "CYP2D6," so NO CODEINE   Fioricet-Codeine [Butalbital-Apap-Caff-Cod] Other (See Comments)    Patient has "CYP2D6" and cannot tolerate codeine, palpitations, rapid heart rate   Isoniazid Shortness Of Breath   Ketorolac Tromethamine Other (See Comments)    Abdominal Pain if given PO. It has to be given IV   Rifampin Shortness Of Breath and Hives   Tape Other (See Comments)    Paper tape causes blisters    Clindamycin Diarrhea and Other (See Comments)    Abdominal pain, also   Ketoprofen Other (See Comments)    Abdominal pain and  cannot take due to history of colitis   Nsaids Other (See Comments)    History of colitis    (Not in a hospital admission)   No results found for this or any previous visit (from the past 48 hour(s)). No results found.  Review of Systems  Constitutional: Negative.   HENT: Negative.    Eyes: Negative.   Respiratory: Negative.    Cardiovascular: Negative.   Gastrointestinal: Negative.   Musculoskeletal: Negative.   Skin: Negative.   Neurological: Negative.   Psychiatric/Behavioral: Negative.      Blood pressure 120/83, pulse 70, temperature 98 F (36.7 C), temperature source Oral, resp. rate 17, height 5\' 3"  (1.6 m), weight 64 kg, SpO2 100 %. Physical Exam Constitutional:      Appearance: She is well-developed.  HENT:     Head: Normocephalic and atraumatic.  Eyes:     Conjunctiva/sclera: Conjunctivae normal.     Pupils: Pupils are equal, round, and reactive to light.  Cardiovascular:     Heart sounds: Normal heart sounds.  Pulmonary:     Effort: Pulmonary effort is normal.  Abdominal:     Palpations: Abdomen is soft.  Musculoskeletal:        General: Normal range of motion.  Cervical back: Normal range of motion.  Skin:    General: Skin is warm and dry.  Neurological:     General: No focal deficit present.     Mental Status: She is alert.  Psychiatric:        Mood and Affect: Mood normal.        Thought Content: Thought content normal.      Assessment/Plan Follow up in January  Mordecai Rasmussen, MD 07/30/2022, 12:30 PM

## 2022-07-30 NOTE — Procedures (Signed)
ECT SERVICES Physician's Interval Evaluation & Treatment Note  Patient Identification: Kayla Holden MRN:  [id number] Date of Evaluation:  07/30/2022 TX #: 13  MADRS:   MMSE:   P.E. Findings:  No change to physical exam  Psychiatric Interval Note:  Better than yesterday  Subjective:  Patient is a 41 y.o. female seen for evaluation for Electroconvulsive Therapy. Anxious and depressed but not actively suicidal  Treatment Summary:   []   Right Unilateral             [x]  Bilateral   % Energy : 1.0 ms 40%   Impedance: 2410 ohms  Seizure Energy Index: 23,618 V squared  Postictal Suppression Index: 89%  Seizure Concordance Index: 97%  Medications  Pre Shock: Toradol 30 mg propofol 40 mg ketamine 40 mg succinylcholine 80 mg  Post Shock:    Seizure Duration: 35 seconds EMG 38 seconds EEG   Comments: Follow-up in a few weeks  Lungs:  [x]   Clear to auscultation               []  Other:   Heart:    [x]   Regular rhythm             []  irregular rhythm    [x]   Previous H&P reviewed, patient examined and there are NO CHANGES                 []   Previous H&P reviewed, patient examined and there are changes noted.   , MD 12/13/20235:50 PM

## 2022-07-30 NOTE — Transfer of Care (Signed)
Immediate Anesthesia Transfer of Care Note  Patient: Kayla Holden  Procedure(s) Performed: ECT TX  Patient Location: PACU  Anesthesia Type:General  Level of Consciousness: drowsy and patient cooperative  Airway & Oxygen Therapy: Patient Spontanous Breathing  Post-op Assessment: Report given to RN and Post -op Vital signs reviewed and stable  Post vital signs: Reviewed and stable  Last Vitals:  Vitals Value Taken Time  BP 115/78 07/30/22 1334  Temp    Pulse 94 07/30/22 1334  Resp 14 07/30/22 1334  SpO2 92 % 07/30/22 1334  Vitals shown include unvalidated device data.  Last Pain:  Vitals:   07/30/22 1155  TempSrc:   PainSc: 0-No pain         Complications: No notable events documented.

## 2022-07-30 NOTE — Progress Notes (Signed)
Patient awake/alert x4.  Tolerated Ect without event.  Sips of gingerale while in pacu without event.  Very pleasant.

## 2022-08-04 DIAGNOSIS — F4310 Post-traumatic stress disorder, unspecified: Secondary | ICD-10-CM | POA: Diagnosis not present

## 2022-08-04 DIAGNOSIS — F419 Anxiety disorder, unspecified: Secondary | ICD-10-CM | POA: Diagnosis not present

## 2022-08-04 DIAGNOSIS — F314 Bipolar disorder, current episode depressed, severe, without psychotic features: Secondary | ICD-10-CM | POA: Diagnosis not present

## 2022-08-13 DIAGNOSIS — F419 Anxiety disorder, unspecified: Secondary | ICD-10-CM | POA: Diagnosis not present

## 2022-08-13 DIAGNOSIS — Z6825 Body mass index (BMI) 25.0-25.9, adult: Secondary | ICD-10-CM | POA: Diagnosis not present

## 2022-08-13 DIAGNOSIS — G479 Sleep disorder, unspecified: Secondary | ICD-10-CM | POA: Diagnosis not present

## 2022-08-19 DIAGNOSIS — F4310 Post-traumatic stress disorder, unspecified: Secondary | ICD-10-CM | POA: Diagnosis not present

## 2022-08-21 DIAGNOSIS — M25562 Pain in left knee: Secondary | ICD-10-CM | POA: Diagnosis not present

## 2022-08-21 DIAGNOSIS — Z9889 Other specified postprocedural states: Secondary | ICD-10-CM | POA: Diagnosis not present

## 2022-08-28 DIAGNOSIS — L732 Hidradenitis suppurativa: Secondary | ICD-10-CM | POA: Diagnosis not present

## 2022-08-28 DIAGNOSIS — D225 Melanocytic nevi of trunk: Secondary | ICD-10-CM | POA: Diagnosis not present

## 2022-08-28 DIAGNOSIS — D2261 Melanocytic nevi of right upper limb, including shoulder: Secondary | ICD-10-CM | POA: Diagnosis not present

## 2022-08-28 DIAGNOSIS — L720 Epidermal cyst: Secondary | ICD-10-CM | POA: Diagnosis not present

## 2022-08-28 DIAGNOSIS — D2271 Melanocytic nevi of right lower limb, including hip: Secondary | ICD-10-CM | POA: Diagnosis not present

## 2022-08-28 DIAGNOSIS — L814 Other melanin hyperpigmentation: Secondary | ICD-10-CM | POA: Diagnosis not present

## 2022-08-28 DIAGNOSIS — D1801 Hemangioma of skin and subcutaneous tissue: Secondary | ICD-10-CM | POA: Diagnosis not present

## 2022-08-28 DIAGNOSIS — L821 Other seborrheic keratosis: Secondary | ICD-10-CM | POA: Diagnosis not present

## 2022-09-02 DIAGNOSIS — F4310 Post-traumatic stress disorder, unspecified: Secondary | ICD-10-CM | POA: Diagnosis not present

## 2022-09-04 ENCOUNTER — Telehealth: Payer: Self-pay

## 2022-09-04 NOTE — Telephone Encounter (Signed)
Sleep referral from [facility] given to sleep lab

## 2022-09-08 DIAGNOSIS — M25562 Pain in left knee: Secondary | ICD-10-CM | POA: Diagnosis not present

## 2022-09-15 DIAGNOSIS — M25562 Pain in left knee: Secondary | ICD-10-CM | POA: Diagnosis not present

## 2022-09-22 DIAGNOSIS — F419 Anxiety disorder, unspecified: Secondary | ICD-10-CM | POA: Diagnosis not present

## 2022-09-22 DIAGNOSIS — F314 Bipolar disorder, current episode depressed, severe, without psychotic features: Secondary | ICD-10-CM | POA: Diagnosis not present

## 2022-09-22 DIAGNOSIS — G4700 Insomnia, unspecified: Secondary | ICD-10-CM | POA: Diagnosis not present

## 2022-09-24 NOTE — Telephone Encounter (Signed)
Received another sleep referral from [facility]. Placed in sleep referrals box

## 2022-10-13 DIAGNOSIS — S83282D Other tear of lateral meniscus, current injury, left knee, subsequent encounter: Secondary | ICD-10-CM | POA: Diagnosis not present

## 2022-10-13 DIAGNOSIS — M2342 Loose body in knee, left knee: Secondary | ICD-10-CM | POA: Diagnosis not present

## 2022-10-16 ENCOUNTER — Encounter: Payer: Self-pay | Admitting: *Deleted

## 2022-10-20 ENCOUNTER — Ambulatory Visit: Payer: PPO | Admitting: Neurology

## 2022-10-20 ENCOUNTER — Encounter: Payer: Self-pay | Admitting: Neurology

## 2022-10-20 VITALS — BP 121/86 | HR 71 | Ht 63.0 in | Wt 161.2 lb

## 2022-10-20 DIAGNOSIS — R519 Headache, unspecified: Secondary | ICD-10-CM | POA: Diagnosis not present

## 2022-10-20 DIAGNOSIS — G4700 Insomnia, unspecified: Secondary | ICD-10-CM

## 2022-10-20 DIAGNOSIS — G4719 Other hypersomnia: Secondary | ICD-10-CM | POA: Diagnosis not present

## 2022-10-20 DIAGNOSIS — Z9189 Other specified personal risk factors, not elsewhere classified: Secondary | ICD-10-CM | POA: Diagnosis not present

## 2022-10-20 DIAGNOSIS — Z72820 Sleep deprivation: Secondary | ICD-10-CM

## 2022-10-20 NOTE — Patient Instructions (Addendum)
Thank you for choosing [facility] for your sleep related care! It was nice to meet you today!   Here is what we discussed today:   Your chronic insomnia may respond to cognitive behavioral therapy (CBT-I).  Please talk to Silvio Pate about it.    Based on your symptoms and your exam I believe you may be at some risk for obstructive sleep apnea (aka OSA). We should proceed with a sleep study to determine whether you do or do not have OSA and how severe it is. Even, if you have mild OSA, I may want you to consider treatment with CPAP, as treatment of even borderline or mild sleep apnea can result and improvement of symptoms such as sleep disruption, daytime sleepiness, nighttime bathroom breaks, restless leg symptoms, improvement of headache syndromes, even improved mood disorder.   As explained, an attended sleep study (meaning you get to stay overnight in the sleep lab), lets [place] monitor sleep-related behaviors such as sleep talking and leg movements in sleep, in addition to monitoring for sleep apnea.  A home sleep test is a screening tool for sleep apnea diagnosis only, but unfortunately, does not help with any other sleep-related diagnoses.  Please remember, the long-term risks and ramifications of untreated moderate to severe obstructive sleep apnea may include (but are not limited to): increased risk for cardiovascular disease, including congestive heart failure, stroke, difficult to control hypertension, treatment resistant obesity, arrhythmias, especially irregular heartbeat commonly known as A. Fib. (atrial fibrillation); even type 2 diabetes has been linked to untreated OSA.   Other correlations that untreated obstructive sleep apnea include macular edema which is swelling of the retina in the eyes, droopy eyelid syndrome, and elevated hemoglobin and hematocrit levels (often referred to as polycythemia).  Sleep apnea can cause disruption of sleep and sleep deprivation in most  cases, which, in turn, can cause recurrent headaches, problems with memory, mood, concentration, focus, and vigilance. Most people with untreated sleep apnea report excessive daytime sleepiness, which can affect their ability to drive. Please do not drive or use heavy equipment or machinery, if you feel sleepy! Patients with sleep apnea can also develop difficulty initiating and maintaining sleep (aka insomnia).   Having sleep apnea may increase your risk for other sleep disorders, including involuntary behaviors sleep such as sleep terrors, sleep talking, sleepwalking.    Having sleep apnea can also increase your risk for restless leg syndrome and leg movements at night.   Please note that untreated obstructive sleep apnea may carry additional perioperative morbidity. Patients with significant obstructive sleep apnea (typically, in the moderate to severe degree) should receive, if possible, perioperative PAP (positive airway pressure) therapy and the surgeons and particularly the anesthesiologists should be informed of the diagnosis and the severity of the sleep disordered breathing.   We will call you or email you through [place] with regards to your test results and plan a follow-up in sleep clinic accordingly. Most likely, you will hear from one of our nurses.   Our sleep lab administrative assistant will call you to schedule your sleep study and give you further instructions, regarding the check in process for the sleep study, arrival time, what to bring, when you can expect to leave after the study, etc., and to answer any other logistical questions you may have. If you don't hear back from her by about 2 weeks from now, please feel free to call her direct line at [phone] or you can call our general clinic number, or  email [place] through My Chart.

## 2022-10-20 NOTE — Progress Notes (Signed)
Subjective:    Patient ID: Kayla Holden is a 42 y.o. female.  HPI    Star Age, MD, PhD [facility] [address], [address] [address] [place], [place] [address]  Dear Dr. Helene Kelp,  I saw your patient, Kayla Holden, upon your kind request in my sleep clinic today for initial consultation of her sleep disorder, in particular, concern for underlying obstructive sleep apnea.  The patient is unaccompanied today.  As you know, Kayla Holden is a 42 year old female with an underlying medical history of endometriosis, fatty liver, anemia, bipolar disorder, with treatment with ECT, migraine headaches, Chiari malformation, hypertension, and overweight state, who reports chronic difficulty initiating and maintaining sleep and excessive daytime sleepiness.  Her Epworth sleepiness score is 15 out of 24, fatigue severity score is 55 out of 63.  She admits that she has had sleepiness at the wheel and had to pull over or change drivers. She does not believe she snores. I reviewed your office note from 08/13/2022.  She was advised to seek a sleep study by her mental health provider.  She had insomnia while on lithium, she is no longer on lithium.  She had a visit with [facility] on 08/04/2022, at which time her lithium was discontinued.  She was advised to increase her propranolol and continue with gabapentin.  I also reviewed an office visit note from [facility] from 06/23/2022, she saw Silvio Pate, PA at the time.  I also reviewed a visit note from Silvio Pate, [place] on 05/19/2022, at which time her Lamictal was discontinued, she was advised to increase her gabapentin and she was maintained on Latuda.  I reviewed a visit note with the [facility] from 04/15/2022 as well as 03/19/2022 and 03/10/2022.  She reports occasional morning headaches.  She takes Tylenol for these.  She also has a history of migraine.  She has been on Ambien for years, previously on 5 and then 10 mg, currently  on long-acting 12.5 mg each night.  She falls asleep fairly quickly, goes to bed around 9:30 PM or 10 PM but has difficulty maintaining sleep, may be awake somewhere between 1 and 3 AM and cannot go back to sleep.  She has no nightly nocturia, she lives alone, her fianc has not complained about her snoring.  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  She is on several potentially sedating medications including Latuda which she takes at lunchtime, she also takes Valium 5 mg 3 times daily.  She is not aware of any family history of sleep apnea.  She tries not to take any naps.  She drinks caffeine in the form of soda, 2 8 ounce bottles per day, alcohol occasionally, typically on special occasions only.  She quit smoking in 2010.  She is scheduled for left knee surgery later this month.  Her Past Medical History Is Significant For: Past Medical History:  Diagnosis Date   Anemia    Anxiety    Anxiety    Bipolar 1 disorder ([place])    Chiari malformation    Colitis    Complication of anesthesia    Depression    Endometriosis    Fatty liver    GERD (gastroesophageal reflux disease)    Hypertension    MDD (major depressive disorder)    Microscopic colitis    Migraine headache    PONV (postoperative nausea and vomiting)  Her Past Surgical History Is Significant For: Past Surgical History:  Procedure Laterality Date   APPENDECTOMY     CHOLECYSTECTOMY     INCISION AND DRAINAGE OF WOUND Left 10/28/2021   Procedure: KNEE ARTHROSCOPY WITH IRRIGATION AND DEBRIDEMENT;  Surgeon: Sydnee Cabal, MD;  Location: [facility];  Service: Orthopedics;  Laterality: Left;  64 okay per OR   KNEE SURGERY Left    3 times   MANDIBLE SURGERY      Her Family History Is Significant For: Family History  Problem Relation Age of Onset   Macular degeneration Mother    Cancer Maternal Grandmother    Cancer Paternal Grandfather    Allergic rhinitis Neg Hx     Angioedema Neg Hx    Asthma Neg Hx    Atopy Neg Hx    Eczema Neg Hx    Urticaria Neg Hx    Immunodeficiency Neg Hx     Her Social History Is Significant For: Social History   Socioeconomic History   Marital status: Single    Spouse name: Not on file   Number of children: Not on file   Years of education: Not on file   Highest education level: Not on file  Occupational History   Not on file  Tobacco Use   Smoking status: Former    Types: Cigarettes   Smokeless tobacco: Never  Vaping Use   Vaping Use: Never used  Substance and Sexual Activity   Alcohol use: Yes    Comment: social   Drug use: Not Currently    Types: Marijuana    Comment: since 2010   Sexual activity: Not on file  Other Topics Concern   Not on file  Social History Narrative   Caffiene 2 drinks per day   Lives home alone.  Education college   No kids.   Social Determinants of Health   Financial Resource Strain: Not on file  Food Insecurity: Not on file  Transportation Needs: Not on file  Physical Activity: Not on file  Stress: Not on file  Social Connections: Not on file    Her Allergies Are:  Allergies  Allergen Reactions   Codeine Other (See Comments)    Patient has "CYP2D6," so NO CODEINE   Fioricet-Codeine [Butalbital-Apap-Caff-Cod] Other (See Comments)    Patient has "CYP2D6" and cannot tolerate codeine, palpitations, rapid heart rate   Isoniazid Shortness Of Breath   Ketorolac Tromethamine Other (See Comments)    Abdominal Pain if given PO. It has to be given IV   Rifampin Shortness Of Breath and Hives   Tape Other (See Comments)    Paper tape causes blisters    Clindamycin Diarrhea and Other (See Comments)    Abdominal pain, also   Ketoprofen Other (See Comments)    Abdominal pain and cannot take due to history of colitis   Nsaids Other (See Comments)    History of colitis   Doxycycline Rash    Swollen lips, pins and needles  :   Her Current Medications Are:  Outpatient  Encounter Medications as of 10/20/2022  Medication Sig   Ascorbic Acid (VITA-C PO) Take 2 tablets by mouth daily.   cholecalciferol (CHOLECALCIFEROL) 25 MCG tablet Take 1 tablet (1,000 Units total) by mouth daily.   cyanocobalamin 1000 MCG tablet Take 1 tablet (1,000 mcg total) by mouth daily.   diazepam (VALIUM) 5 MG/ML solution Take 5 mg by mouth every 8 (eight) hours as needed for anxiety.   dicyclomine (BENTYL) 20 MG tablet  Take 2 tablets (40 mg total) by mouth 4 (four) times daily as needed for spasms.   diphenoxylate-atropine (LOMOTIL) 2.5-0.025 MG tablet Take 1 tablet by mouth 4 (four) times daily as needed for diarrhea or loose stools.   dronabinol (MARINOL) 5 MG capsule Take 1 capsule (5 mg total) by mouth 2 (two) times daily.   drospirenone-ethinyl estradiol (YAZ) 3-0.02 MG tablet Take 1 tablet by mouth daily with lunch.   gabapentin (NEURONTIN) 300 MG capsule Take 300 mg by mouth 3 (three) times daily.   lurasidone (LATUDA) 40 MG TABS tablet Take 1 tablet (40 mg total) by mouth daily with supper. (Patient taking differently: Take 80 mg by mouth daily with supper.)   ondansetron (ZOFRAN-ODT) 8 MG disintegrating tablet Take 1 tablet (8 mg total) by mouth every 8 (eight) hours as needed for nausea (DISSOLVE ORALLY).   pantoprazole (PROTONIX) 40 MG tablet Take 2 tablets (80 mg total) by mouth 2 (two) times daily.   propranolol (INDERAL) 10 MG tablet Take 1 tablet (10 mg total) by mouth 3 (three) times daily.   rizatriptan (MAXALT) 10 MG tablet Take 1 tablet (10 mg total) by mouth as needed for migraine (and may repeat once in 2 hours, if no relief).   rosuvastatin (CRESTOR) 10 MG tablet Take 1 tablet (10 mg total) by mouth at bedtime.   spironolactone (ALDACTONE) 100 MG tablet Take 1 tablet (100 mg total) by mouth daily at 12 noon.   traMADol (ULTRAM) 50 MG tablet Take 50 mg by mouth in the morning, at noon, and at bedtime.   zolpidem (AMBIEN CR) 12.5 MG CR tablet Take 1 tablet (12.5 mg  total) by mouth at bedtime.   [DISCONTINUED] LORazepam (ATIVAN) 1 MG tablet Take 1 tablet (1 mg total) by mouth 3 (three) times daily.   No facility-administered encounter medications on file as of 10/20/2022.  :   Review of Systems:  Out of a complete 14 point review of systems, all are reviewed and negative with the exception of these symptoms as listed below:   Review of Systems  Neurological:        Difficulty falling asleep, daytime fatigue, rule out OSA - No prior sleep study.  ESS  DSS    Objective:  Neurological Exam  Physical Exam Physical Examination:   Vitals:   10/20/22 0953  BP: 121/86  Pulse: 71    General Examination: The patient is a very pleasant 42 y.o. female in no acute distress. She appears well-developed and well-nourished and well groomed.   HEENT: Normocephalic, atraumatic, pupils are equal, round and reactive to light, extraocular tracking is good without limitation to gaze excursion or nystagmus noted. Hearing is grossly intact. Face is symmetric with mild facial masking. Speech is slow, but clear with no dysarthria noted. There is no hypophonia. There is no lip, neck/head, jaw or voice tremor. Neck is supple with full range of passive and active motion. There are no carotid bruits on auscultation. Oropharynx exam reveals: mild mouth dryness, good dental hygiene and mild airway crowding, due to smaller airway entry. Mallampati is class I. Tongue protrudes centrally and palate elevates symmetrically. Tonsils are about 1+ bilaterally. Neck size is 14 3/8 inches. She has a minimal to mild overbite.   Chest: Clear to auscultation without wheezing, rhonchi or crackles noted.  Heart: S1+S2+0, regular and normal without murmurs, rubs or gallops noted.   Abdomen: Soft, non-tender and non-distended.  Extremities: There is no pitting edema in the distal lower extremities bilaterally.  Skin: Warm and dry without trophic changes noted.   Musculoskeletal: exam  reveals no obvious joint deformities.   Neurologically:  Mental status: The patient is awake, alert and oriented in all 4 spheres. Her immediate and remote memory, attention, language skills and fund of knowledge are appropriate. There is no evidence of aphasia, agnosia, apraxia or anomia. Speech is clear with normal prosody and enunciation. Thought process is linear. Mood is constricted and affect is flat.  Cranial nerves II - XII are as described above under HEENT exam.  Motor exam: Normal bulk, strength and tone is noted. There is no obvious action or resting tremor.  Fine motor skills and coordination: grossly intact.  Cerebellar testing: No dysmetria or intention tremor. There is no truncal or gait ataxia.  Sensory exam: intact to light touch in the upper and lower extremities.  Gait, station and balance: She stands easily. No veering to one side is noted. No leaning to one side is noted. Posture is age-appropriate and stance is narrow based. Gait shows normal stride length and normal pace. No problems turning are noted.   Assessment and Plan:  In summary, Kayla Holden is a very pleasant 42 y.o.-year old female with an underlying medical history of endometriosis, fatty liver, anemia, bipolar disorder, with treatment with ECT,, migraine headaches, Chiari malformation, hypertension, and overweight state, who presents for evaluation of her chronic sleep difficulty, particularly difficulty maintaining sleep.  We talked about the most common organic sleep disorder, obstructive sleep apnea (OSA), its prognosis and treatment options. We talked about medical/conservative treatments, surgical interventions and non-pharmacological approaches for symptom control.  We talked about her daytime somnolence.  I do believe that her daytime sleepiness may be in part from sleep deprivation and also in part from medication effect.  She is advised not to drive when feeling sleepy.  I also explained, in particular,  the risks and ramifications of untreated moderate to severe OSA, especially with respect to developing cardiovascular disease down the road, including congestive heart failure (CHF), difficult to treat hypertension, cardiac arrhythmias (particularly A-fib), neurovascular complications including TIA, stroke and dementia. Even type 2 diabetes has, in part, been linked to untreated OSA. Symptoms of untreated OSA may include (but may not be limited to) daytime sleepiness, nocturia (i.e. frequent nighttime urination), memory problems, mood irritability and suboptimally controlled or worsening mood disorder such as depression and/or anxiety, lack of energy, lack of motivation, physical discomfort, as well as recurrent headaches, especially morning or nocturnal headaches. We talked about the importance of maintaining a healthy lifestyle and striving for healthy weight.  For chronic sleep difficulty she is advised to talk to her mental health provider about pursuing cognitive behavioral therapy for insomnia. I recommended a sleep study at this time. I outlined the differences between a laboratory attended sleep study which is considered more comprehensive and accurate over the option of a home sleep test (HST); the latter may lead to underestimation of sleep disordered breathing in some instances and does not help with diagnosing upper airway resistance syndrome and is not accurate enough to diagnose primary central sleep apnea typically. I outlined possible surgical and non-surgical treatment options of OSA, including the use of a positive airway pressure (PAP) device (i.e. CPAP, AutoPAP/APAP or BiPAP in certain circumstances), a custom-made dental device (aka oral appliance, which would require a referral to a specialist dentist or orthodontist typically, and is generally speaking not considered for patients with full dentures or edentulous state), upper airway surgical options, such as  traditional UPPP (which is not  considered a first-line treatment) or the Inspire device (hypoglossal nerve stimulator, which would involve a referral for consultation with an ENT surgeon, after careful selection, following inclusion criteria - also not first-line treatment). I explained the PAP treatment option to the patient in detail, as this is generally considered first-line treatment.  The patient indicated that she would be willing to try PAP therapy, if the need arises. I explained the importance of being compliant with PAP treatment, not only for insurance purposes but primarily to improve patient's symptoms symptoms, and for the patient's long term health benefit, including to reduce Her cardiovascular risks longer-term.    We will pick up our discussion about the next steps and treatment options after testing.  We will keep her posted as to the test results by phone call and/or MyChart messaging where possible.  We will plan to follow-up in sleep clinic accordingly as well.  I answered all her questions today and the patient was in agreement.   I encouraged her to call with any interim questions, concerns, problems or updates or email [place] through [place].  Generally speaking, sleep test authorizations may take up to 2 weeks, sometimes less, sometimes longer, the patient is encouraged to get in touch with [place] if they do not hear back from the sleep lab staff directly within the next 2 weeks.  Thank you very much for allowing me to participate in the care of this nice patient. If I can be of any further assistance to you please do not hesitate to call me at [phone].  Sincerely,   Star Age, MD, PhD

## 2022-10-27 DIAGNOSIS — F4310 Post-traumatic stress disorder, unspecified: Secondary | ICD-10-CM | POA: Diagnosis not present

## 2022-11-04 DIAGNOSIS — G8918 Other acute postprocedural pain: Secondary | ICD-10-CM | POA: Diagnosis not present

## 2022-11-04 DIAGNOSIS — M2342 Loose body in knee, left knee: Secondary | ICD-10-CM | POA: Diagnosis not present

## 2022-11-04 DIAGNOSIS — X58XXXA Exposure to other specified factors, initial encounter: Secondary | ICD-10-CM | POA: Diagnosis not present

## 2022-11-04 DIAGNOSIS — M94262 Chondromalacia, left knee: Secondary | ICD-10-CM | POA: Diagnosis not present

## 2022-11-04 DIAGNOSIS — S83272A Complex tear of lateral meniscus, current injury, left knee, initial encounter: Secondary | ICD-10-CM | POA: Diagnosis not present

## 2022-11-04 DIAGNOSIS — Y999 Unspecified external cause status: Secondary | ICD-10-CM | POA: Diagnosis not present

## 2022-11-06 ENCOUNTER — Telehealth: Payer: Self-pay | Admitting: Neurology

## 2022-11-06 NOTE — Telephone Encounter (Signed)
[facility] pending faxed notes 

## 2022-11-10 DIAGNOSIS — L0231 Cutaneous abscess of buttock: Secondary | ICD-10-CM | POA: Diagnosis not present

## 2022-11-10 DIAGNOSIS — K5900 Constipation, unspecified: Secondary | ICD-10-CM | POA: Diagnosis not present

## 2022-11-17 NOTE — Telephone Encounter (Signed)
Called [facility] to check the status- it is still pending.

## 2022-11-18 DIAGNOSIS — M6281 Muscle weakness (generalized): Secondary | ICD-10-CM | POA: Diagnosis not present

## 2022-11-18 DIAGNOSIS — R293 Abnormal posture: Secondary | ICD-10-CM | POA: Diagnosis not present

## 2022-11-18 DIAGNOSIS — M25562 Pain in left knee: Secondary | ICD-10-CM | POA: Diagnosis not present

## 2022-11-18 DIAGNOSIS — Z4789 Encounter for other orthopedic aftercare: Secondary | ICD-10-CM | POA: Diagnosis not present

## 2022-11-18 DIAGNOSIS — M25662 Stiffness of left knee, not elsewhere classified: Secondary | ICD-10-CM | POA: Diagnosis not present

## 2022-11-18 DIAGNOSIS — R2681 Unsteadiness on feet: Secondary | ICD-10-CM | POA: Diagnosis not present

## 2022-11-30 ENCOUNTER — Emergency Department (HOSPITAL_BASED_OUTPATIENT_CLINIC_OR_DEPARTMENT_OTHER): Payer: PPO

## 2022-11-30 ENCOUNTER — Encounter (HOSPITAL_BASED_OUTPATIENT_CLINIC_OR_DEPARTMENT_OTHER): Payer: Self-pay | Admitting: Emergency Medicine

## 2022-11-30 ENCOUNTER — Emergency Department (HOSPITAL_BASED_OUTPATIENT_CLINIC_OR_DEPARTMENT_OTHER)
Admission: EM | Admit: 2022-11-30 | Discharge: 2022-11-30 | Disposition: A | Discharge status: Home / Self Care | Payer: PPO | Attending: Emergency Medicine | Admitting: Emergency Medicine

## 2022-11-30 DIAGNOSIS — M25471 Effusion, right ankle: Secondary | ICD-10-CM | POA: Diagnosis not present

## 2022-11-30 DIAGNOSIS — Z7982 Long term (current) use of aspirin: Secondary | ICD-10-CM | POA: Diagnosis not present

## 2022-11-30 DIAGNOSIS — M7989 Other specified soft tissue disorders: Secondary | ICD-10-CM | POA: Insufficient documentation

## 2022-11-30 DIAGNOSIS — R2241 Localized swelling, mass and lump, right lower limb: Secondary | ICD-10-CM | POA: Diagnosis not present

## 2022-11-30 LAB — PREGNANCY, URINE: Preg Test, Ur: NEGATIVE

## 2022-11-30 MED ORDER — OXYCODONE-ACETAMINOPHEN 5-325 MG PO TABS
1.0000 | ORAL_TABLET | Freq: Once | ORAL | Status: AC
  Administered 2022-11-30: 1 via ORAL
  Filled 2022-11-30: qty 1

## 2022-11-30 NOTE — ED Provider Notes (Signed)
Patient here with swelling to the right lower leg.  Mostly in the right ankle.  This is a shared visit with the PA.  DVT study and x-rays were obtained.  No blood clot.  No fractures.  Maybe a little bit of effusion/swelling around the right ankle.  There is no signs of cellulitis on exam.  She has had an ankle fusion before in the past and overall suspect that this is an inflammatory process.  Recommend Tylenol, ice, rest.  She has a walking boot at home that she can use for few days.  Will have her follow-up with orthopedics.  Neurovascular neuromuscular intact otherwise.  No concern for arterial process.  Discharged in good condition.  This chart was dictated using voice recognition software.  Despite best efforts to proofread,  errors can occur which can change the documentation meaning.    Virgina Norfolk, DO 11/30/22 1639

## 2022-11-30 NOTE — ED Provider Notes (Signed)
[place] [facility] AT [facility] Provider Note   CSN: [id number] Arrival date & time: 11/30/22  1454     History  Chief Complaint  Patient presents with   Leg Swelling    LAKEITA PANTHER is a 42 y.o. female, history of multiple left knee surgeries, right ankle surgeries, who presents to the ED secondary to right ankle swelling, that is been going on for the last couple days.  She states she had left knee repair, 3 weeks ago, and has been on aspirin 81 mg twice daily.  She notes that she has been icing and resting the area, since the swelling started.  Denies any trauma.  States that she started walking it the day after her knee repair.  Has been favoring her right leg a little bit more though.  Denies any trauma.  Denies any wounds, redness.  States it hurts a lot, she is been taking her tramadol without relief.   Home Medications Prior to Admission medications   Medication Sig Start Date End Date Taking? Authorizing Provider  Ascorbic Acid (VITA-C PO) Take 2 tablets by mouth daily.    [provider]  cholecalciferol (CHOLECALCIFEROL) 25 MCG tablet Take 1 tablet (1,000 Units total) by mouth daily. 03/17/22   Clapacs, Jackquline Denmark, MD  cyanocobalamin 1000 MCG tablet Take 1 tablet (1,000 mcg total) by mouth daily. 03/17/22   Clapacs, Jackquline Denmark, MD  diazepam (VALIUM) 5 MG/ML solution Take 5 mg by mouth every 8 (eight) hours as needed for anxiety.    [provider]  dicyclomine (BENTYL) 20 MG tablet Take 2 tablets (40 mg total) by mouth 4 (four) times daily as needed for spasms. 03/17/22   Clapacs, Jackquline Denmark, MD  diphenoxylate-atropine (LOMOTIL) 2.5-0.025 MG tablet Take 1 tablet by mouth 4 (four) times daily as needed for diarrhea or loose stools.    [provider]  dronabinol (MARINOL) 5 MG capsule Take 1 capsule (5 mg total) by mouth 2 (two) times daily. 03/17/22   Clapacs, Jackquline Denmark, MD  drospirenone-ethinyl estradiol (YAZ) 3-0.02 MG tablet Take 1 tablet  by mouth daily with lunch.    [provider]  lurasidone (LATUDA) 40 MG TABS tablet Take 1 tablet (40 mg total) by mouth daily with supper. Patient not taking: Reported on 10/20/2022 03/17/22   Clapacs, Jackquline Denmark, MD  ondansetron (ZOFRAN-ODT) 8 MG disintegrating tablet Take 1 tablet (8 mg total) by mouth every 8 (eight) hours as needed for nausea (DISSOLVE ORALLY). 03/17/22   Clapacs, Jackquline Denmark, MD  pantoprazole (PROTONIX) 40 MG tablet Take 2 tablets (80 mg total) by mouth 2 (two) times daily. 03/17/22   Clapacs, Jackquline Denmark, MD  propranolol (INDERAL) 10 MG tablet Take 1 tablet (10 mg total) by mouth 3 (three) times daily. 03/17/22   Clapacs, Jackquline Denmark, MD  rosuvastatin (CRESTOR) 10 MG tablet Take 1 tablet (10 mg total) by mouth at bedtime. 03/17/22   Clapacs, Jackquline Denmark, MD  spironolactone (ALDACTONE) 100 MG tablet Take 1 tablet (100 mg total) by mouth daily at 12 noon. 03/17/22   Clapacs, Jackquline Denmark, MD  traMADol (ULTRAM) 50 MG tablet Take 50 mg by mouth in the morning, at noon, and at bedtime. 09/15/22   [provider]  zolpidem (AMBIEN CR) 12.5 MG CR tablet Take 1 tablet (12.5 mg total) by mouth at bedtime. 03/17/22   Clapacs, Jackquline Denmark, MD      Allergies    Codeine, Fioricet-codeine [butalbital-apap-caff-cod], Isoniazid, Ketorolac tromethamine, Rifampin,  Tape, Clindamycin, Ketoprofen, Nsaids, and Doxycycline    Review of Systems   Review of Systems  Musculoskeletal:        +R ankle pain    Physical Exam Updated Vital Signs BP 119/86   Pulse 72   Temp 98.2 F (36.8 C) (Oral)   Resp 16   Wt 77.1 kg   SpO2 100%   BMI 30.11 kg/m  Physical Exam Vitals and nursing note reviewed.  Constitutional:      General: She is not in acute distress.    Appearance: She is well-developed.  HENT:     Head: Normocephalic and atraumatic.  Eyes:     General:        Right eye: No discharge.        Left eye: No discharge.     Conjunctiva/sclera: Conjunctivae normal.  Pulmonary:     Effort: No respiratory  distress.  Musculoskeletal:     Comments: Right leg/ankle/foot: TTP of lateral and medial malleolus, as well as dorsal aspect of mid foot. TTP of posterior calf. Edema noted to ankle and foot. Able to bear weight. Able to plantar flex and dorsiflex ankle. Negative Thompson test. No base of 5th metatarsal tenderness to palpation. Capillary refill <2sec. Dorsalis pedis pulse present. No foot drop noted. Sensation intact. Warm to touch.    Skin:    General: Skin is warm and dry.  Neurological:     Mental Status: She is alert.     Comments: Clear speech.   Psychiatric:        Behavior: Behavior normal.        Thought Content: Thought content normal.     ED Results / Procedures / Treatments   Labs (all labs ordered are listed, but only abnormal results are displayed) Labs Reviewed  PREGNANCY, URINE    EKG None  Radiology DG Ankle Complete Right  Result Date: 11/30/2022 CLINICAL DATA:  Pain and swelling. EXAM: RIGHT ANKLE - COMPLETE 3+ VIEW COMPARISON:  None Available. FINDINGS: Osseous alignment is normal. Ankle mortise is symmetric. No fracture line or displaced fracture fragment. No focal cortical irregularity or osseous lesion. Questionable soft tissue swelling at the level of the ankle. Visualized portions of the hindfoot and midfoot are unremarkable. IMPRESSION: Questionable soft tissue swelling. No osseous abnormality seen. Electronically Signed   By: Bary Richard M.D.   On: 11/30/2022 16:22   DG Tibia/Fibula Right  Result Date: 11/30/2022 CLINICAL DATA:  Lower leg swelling and pain. EXAM: RIGHT TIBIA AND FIBULA - 2 VIEW COMPARISON:  None Available. FINDINGS: There is no evidence of fracture or other focal bone lesions. Soft tissues are unremarkable. IMPRESSION: Negative. Electronically Signed   By: Bary Richard M.D.   On: 11/30/2022 16:22   DG Foot Complete Right  Result Date: 11/30/2022 CLINICAL DATA:  Lower leg swelling the past few days, pain. No known injuries. EXAM: RIGHT  FOOT COMPLETE - 3+ VIEW COMPARISON:  None Available. FINDINGS: There is no evidence of fracture or dislocation. There is no evidence of arthropathy or other focal bone abnormality. Soft tissues are unremarkable. IMPRESSION: Negative. Electronically Signed   By: Bary Richard M.D.   On: 11/30/2022 16:21   US Venous Img Lower Right (DVT Study)  Result Date: 11/30/2022 CLINICAL DATA:  Swelling. EXAM: RIGHT LOWER EXTREMITY VENOUS DOPPLER ULTRASOUND TECHNIQUE: Gray-scale sonography with compression, as well as color and duplex ultrasound, were performed to evaluate the deep venous system(s) from the level of the common femoral vein through the popliteal  and proximal calf veins. COMPARISON:  None Available. FINDINGS: VENOUS Normal compressibility of the common femoral, superficial femoral, and popliteal veins, as well as the visualized calf veins. Visualized portions of profunda femoral vein and great saphenous vein unremarkable. No filling defects to suggest DVT on grayscale or color Doppler imaging. Doppler waveforms show normal direction of venous flow, normal respiratory plasticity and response to augmentation. Limited views of the contralateral common femoral vein are unremarkable. IMPRESSION: No evidence of DVT in the right lower extremity. Electronically Signed   By: Feliberto Harts M.D.   On: 11/30/2022 16:00    Procedures Procedures    Medications Ordered in ED Medications  oxyCODONE-acetaminophen (PERCOCET/ROXICET) 5-325 MG per tablet 1 tablet (1 tablet Oral Given 11/30/22 1606)    ED Course/ Medical Decision Making/ A&P                             Medical Decision Making Patient is a 42 year old female, here for right leg swelling, has been going on for the last couple days has tried ice and elevation without relief.  States is swollen and painful.  On exam and appears to be edematous, she was sent here by urgent care for to rule out a blood clot.  Will obtain DVT study as well as x-rays,  to evaluate for possible stress fracture given her favoring of the right leg since she had left knee surgery couple weeks ago.  Amount and/or Complexity of Data Reviewed Labs: ordered. Radiology: ordered.    Details: Mild soft tissue swelling, no other acute findings.  DVT study negative Discussion of management or test interpretation with external provider(s): Discussed with patient, DVT study negative, x-rays negative.  Had Dr. Lockie Mola see patient, good dorsalis pedis pulse, and good sensation.  Just some mild swelling.  No evidence of redness, wound looks.  Not cellulitic in nature.  Will have her follow-up with PCP and wear walking boot.  Risk Prescription drug management.   Final Clinical Impression(s) / ED Diagnoses Final diagnoses:  Right ankle swelling    Rx / DC Orders ED Discharge Orders     None         Maydell Knoebel, Harley [place], PA 11/30/22 1648    Virgina Norfolk, DO 11/30/22 1803

## 2022-11-30 NOTE — ED Triage Notes (Signed)
Pt reports she has had R lower leg swelling for the past few days. Tried ice and elevation for past few days. Had surgery on L knee a few weeks ago. No known injuries. Went to an urgent care who told her to come here to rule out DVT.

## 2022-11-30 NOTE — Discharge Instructions (Signed)
Consider wearing her walking boot for couple days.  Recommend ice, Tylenol, ibuprofen.  Follow-up with orthopedic doctor if still having issues.

## 2022-11-30 NOTE — ED Notes (Signed)
D/c paperwork reviewed with pt, including follow up care.  No questions or concerns voiced at time of d/c. . Pt verbalized understanding, Ambulatory with family to ED exit, NAD.   

## 2022-12-03 DIAGNOSIS — M19071 Primary osteoarthritis, right ankle and foot: Secondary | ICD-10-CM | POA: Diagnosis not present

## 2022-12-03 DIAGNOSIS — M79671 Pain in right foot: Secondary | ICD-10-CM | POA: Diagnosis not present

## 2022-12-05 DIAGNOSIS — M19071 Primary osteoarthritis, right ankle and foot: Secondary | ICD-10-CM | POA: Diagnosis not present

## 2022-12-08 NOTE — Telephone Encounter (Signed)
NPSG- HTA Kayla Holden: [id number] (exp. 11/06/22 to 02/06/23)   Patient is scheduled at [facility] For 12/09/22 at 9 pm.  Sent mychart message to the patient.

## 2022-12-09 ENCOUNTER — Ambulatory Visit (INDEPENDENT_AMBULATORY_CARE_PROVIDER_SITE_OTHER): Payer: PPO | Admitting: Neurology

## 2022-12-09 DIAGNOSIS — R0683 Snoring: Secondary | ICD-10-CM

## 2022-12-09 DIAGNOSIS — G4700 Insomnia, unspecified: Secondary | ICD-10-CM

## 2022-12-09 DIAGNOSIS — Z9189 Other specified personal risk factors, not elsewhere classified: Secondary | ICD-10-CM

## 2022-12-09 DIAGNOSIS — Z72820 Sleep deprivation: Secondary | ICD-10-CM

## 2022-12-09 DIAGNOSIS — G4720 Circadian rhythm sleep disorder, unspecified type: Secondary | ICD-10-CM

## 2022-12-09 DIAGNOSIS — G4719 Other hypersomnia: Secondary | ICD-10-CM

## 2022-12-09 DIAGNOSIS — R519 Headache, unspecified: Secondary | ICD-10-CM

## 2022-12-16 ENCOUNTER — Telehealth: Payer: Self-pay | Admitting: *Deleted

## 2022-12-16 NOTE — Telephone Encounter (Signed)
-----   Message from Huston Foley, MD sent at 12/16/2022  1:44 PM EDT ----- Patient referred by Dr. Leonor Liv, seen by me on 10/20/22, patient had a diagnostic PSG on 12/09/22.   Please call and notify the patient that the recent sleep study did not show any significant sleep apnea or significant underlying sleep disorder, no significant EKG (heart tracing) changes or EEG (brain wave tracing) changes. Her AHI was 0/hour - and oxygen saturations remained at or above 88% for the night. Some snoring was noted. Treatment with a positive airway pressure device, such as CPAP or autoPAP is not indicated.  Avoidance of the supine sleep position some wt loss may reduce snoring. No organic reason for sleep difficulty was found on this test. Patient did sleep nearly 90% of the time tested, had more light stage sleep, did not achieve any deep sleep but a good amount of REM/dream sleep. At this juncture, she can follow up with her PCP and other providers as scheduled/planned.   Thanks,  Huston Foley, MD, PhD [facility])

## 2022-12-16 NOTE — Telephone Encounter (Signed)
Spoke to patient gave sleep study results Gave pt Dr Johny Sax recommendation. . Informed pt per Dr Frances Furbish can f/u with PCP and other providers as scheduled  Pt expressed understanding and thanked me for calling

## 2022-12-16 NOTE — Procedures (Signed)
Physician Interpretation:     [facility] POLYSOMNOGRAPHY  INTERPRETATION REPORT   STUDY DATE:  12/09/2022     PATIENT NAME:  Kayla Holden         DATE OF BIRTH:  1981-02-23  PATIENT ID:  [id number]    TYPE OF STUDY:  PSG  READING PHYSICIAN: Huston Foley, MD, PhD   SCORING TECHNICIAN: Margaretann Loveless, RPSGT  Referred by: Marylen Ponto, MD   History and Indication for Testing: 42 year old female with an underlying medical history of endometriosis, fatty liver, anemia, bipolar disorder, with treatment with ECT, migraine headaches, Chiari malformation, hypertension, and overweight state, who reports chronic difficulty initiating and maintaining sleep and excessive daytime sleepiness. Her Epworth sleepiness score is 15 out of 24, fatigue severity score is 55 out of 63. The patient reported to the acquisition technologist that she had taken Benadryl, Latuda, Valium and tramadol earlier in the day and that she no longer takes gabapentin. Prior to study start she took Ambien. Height: 63 in Weight: 161 lb (BMI 28) Neck Size: 15 in   MEDICATIONS: Ascorbic Acid, Cholecalciferol, Cyanocobalamin, Valium, Bentyl, Lomotil, Marinol, Yaz, Neurontin, Latuda, Zofran, Protonix, Inderal, Maxalt, Crestor, Aldactone, Ultram, Ambien  TECHNICAL DESCRIPTION: A registered sleep technologist was in attendance for the duration of the recording.  Data collection, scoring, video monitoring, and reporting were performed in compliance with the AASM Manual for the Scoring of Sleep and Associated Events; (Hypopnea is scored based on the criteria listed in Section VIII D. 1b in the AASM Manual V2.6 using a 4% oxygen desaturation rule or Hypopnea is scored based on the criteria listed in Section VIII D. 1a in the AASM Manual V2.6 using 3% oxygen desaturation and /or arousal rule).   SLEEP CONTINUITY AND SLEEP ARCHITECTURE:  Lights-out was at 22:04: and lights-on at  05:08:, with a total recording  time of 7 hours, 3.5 min. Total sleep time ( TST) was 381.0 minutes with a normal sleep efficiency at 90.0%. There was  26.1% REM sleep.   BODY POSITION:  TST was divided  between the following sleep positions: 2.1% supine;  97.9% lateral;  0% prone. Duration of total sleep and percent of total sleep in their respective position is as follows: supine 08 minutes (2%), non-supine 373 minutes (98%); right 252 minutes (66%), left 120 minutes (32%), and prone 00 minutes (0%).  Total supine REM sleep time was 00 minutes (0% of total REM sleep).  Sleep latency was increased at 34.5 minutes.  REM sleep latency was mildly increased at 119.5 minutes. Of the total sleep time, the percentage of stage N1 sleep was 1.6%, stage N2 sleep was 72%, which is markedly increased, stage N3 sleep was absent, and REM sleep was 26.1%, which is borderline increased. Wake after sleep onset (WASO) time accounted for 8 minutes without significant sleep fragmentation noted.    RESPIRATORY MONITORING:  Based on CMS criteria (using a 4% oxygen desaturation rule for scoring hypopneas), there were 0 apneas (0 obstructive; 0 central; 0 mixed), and 0 hypopneas.  Apnea index was 0.0. Hypopnea index was 0.0. The apnea-hypopnea index was 0.0 overall (0.0 supine, 0 non-supine; 0.0 REM, 0.0 supine REM). There were 0 respiratory effort-related arousals (RERAs).  The RERA index was 0 events/h. Total respiratory disturbance index (RDI) was 0.0 events/h. RDI results showed: supine RDI  0.0 /h; non-supine RDI 0.0 /h; REM RDI 0.0 /h, supine REM RDI 0.0 /h.   Based on AASM criteria (using a 3% oxygen desaturation  and /or arousal rule for scoring hypopneas), there were 0 apneas (0 obstructive; 0 central; 0 mixed), and 0 hypopneas. Apnea index was 0.0. Hypopnea index was 0.0. The apnea-hypopnea index was 0.0/hour overall (0.0 supine, 0 non-supine; 0.0 REM, 0.0 supine REM).  There were 0 respiratory effort-related arousals (RERAs).  The RERA index was 0  events/h. Total respiratory disturbance index (RDI) was 0.0 events/h. RDI results showed: supine RDI  0.0 /h; non-supine RDI 0.0 /h; REM RDI 0.0 /h, supine REM RDI 0.0 /h.    OXIMETRY: Oxyhemoglobin Saturation Nadir during sleep was at  88% from a mean of 95%.  Of the Total sleep time (TST)   hypoxemia (=<88%) was present for  0.1 minutes, or 0.0% of total sleep time.   LIMB MOVEMENTS: There were 0 periodic limb movements of sleep (0.0/hr), of which 0 (0.0/hr) were associated with an arousal.   AROUSAL: There were 13 arousals in total, for an arousal index of 2 arousals/hour.  Of these, 0 were identified as respiratory-related arousals (0 /h), 0 were PLM-related arousals (0 /h), and 19 were non-specific arousals (3 /h).   EEG: Review of the EEG showed no abnormal electrical discharges and symmetrical bihemispheric findings.     EKG: The EKG revealed normal sinus rhythm (NSR). The average heart rate during sleep was 73 bpm.    AUDIO/VIDEO REVIEW: The audio and video review did not show any abnormal or unusual behaviors, movements, phonations or vocalizations. The patient took no restroom breaks. Snoring was noted, and was mild and intermittent   POST-STUDY QUESTIONNAIRE: Post study, the patient indicated, that sleep was better than usual.    IMPRESSION:  1. Primary Snoring 2. Dysfunctions associated with sleep stages or arousal from sleep   RECOMMENDATIONS:  1. This study does not demonstrate any significant obstructive or central sleep disordered breathing with an AHI of less than 5/hour - Her AHI was 0/hour - and oxygen saturations at or above 88% for the night. Mild intermittent snoring was noted. Treatment with a positive airway pressure device, such as CPAP or autoPAP is not indicated.  Some degree of weight loss and avoidance of the supine sleep position may alleviate her snoring.   2. This study shows no significant sleep fragmentation but abnormal sleep stage percentages; these are  nonspecific findings and per se do not signify an intrinsic sleep disorder or a cause for the patient's sleep-related symptoms. Causes include (but are not limited to) the first night effect of the sleep study, circadian rhythm disturbances, medication effect (probable) or an underlying mood disorder or medical problem.  3. The patient should be cautioned not to drive, work at heights, or operate dangerous or heavy equipment when tired or sleepy. Review and reiteration of good sleep hygiene measures should be pursued with any patient. 4. The patient will be advised to follow up with the referring provider, who will be notified of the test results.   I certify that I have reviewed the entire raw data recording prior to the issuance of this report in accordance with the Standards of Accreditation of the American Academy of Sleep Medicine (AASM).   Huston Foley, MD, PhD Medical Director, [facility]) Diplomat, ABPN (Neurology and Sleep)               Technical Report:   General Information  Name: Holden, Kayla BMI: 28.52 Physician: Huston Foley, MD  ID: [id number] Height: 63.0 in Technician: Margaretann Loveless, RPSGT  Sex: Female Weight: 161.0  lb Record: [id number]  Age: 46 [Aug 21, 1980] Date: 12/09/2022    Medical & Medication History    42 year old female with an underlying medical history of endometriosis, fatty liver, anemia, bipolar disorder, with treatment with ECT, migraine headaches, Chiari malformation, hypertension, and overweight state, who reports chronic difficulty initiating and maintaining sleep and excessive daytime sleepiness. She admits that she has had sleepiness at the wheel and had to pull over or change drivers. She does not believe she snores. Ascorbic Acid, Cholecalciferol, Cyanocobalamin, Valium, Bentyl, Lomotil, Marinol, Yaz, Neurontin, Latuda, Zofran, Protonix, Inderal, Maxalt, Crestor, Aldactone, Ultram, Ambien   Sleep  Disorder      Comments   The patient came into the lab for a PSG. The patient took Ambien prior to start of study. Per patient she took Benadryl, Latuda, Valium, and Tramadol earlier in the day. Per patient she is no longer taking Gabapentin. The patient had no restroom breaks. EKG kept in NSR. Mild snoring. The patient was a 4% desat. Slept lateral and supine. AHI was0 after 2 hrs of TST.     Lights out: 10:04:36 PM Lights on: 05:08:12 AM   Time Total Supine Side Prone Upright  Recording (TRT) 7h 3.60m 0h 33.6m 6h 30.70m 0h 0.59m 0h 0.23m  Sleep (TST) 6h 21.22m 0h 8.62m 6h 13.110m 0h 0.52m 0h 0.56m   Latency N1 N2 N3 REM Onset Per. Slp. Eff.  Actual 0h 0.45m 0h 5.11m 0h 0.68m 1h 59.22m 0h 34.50m 0h 34.70m 89.96%   Stg Dur Wake N1 N2 N3 REM  Total 42.5 6.0 275.5 0.0 99.5  Supine 25.0 0.0 8.0 0.0 0.0  Side 17.5 6.0 267.5 0.0 99.5  Prone 0.0 0.0 0.0 0.0 0.0  Upright 0.0 0.0 0.0 0.0 0.0   Stg % Wake N1 N2 N3 REM  Total 10.0 1.6 72.3 0.0 26.1  Supine 5.9 0.0 2.1 0.0 0.0  Side 4.1 1.6 70.2 0.0 26.1  Prone 0.0 0.0 0.0 0.0 0.0  Upright 0.0 0.0 0.0 0.0 0.0     Apnea Summary Sub Supine Side Prone Upright  Total 0 Total 0 0 0 0 0    REM 0 0 0 0 0    NREM 0 0 0 0 0  Obs 0 REM 0 0 0 0 0    NREM 0 0 0 0 0  Mix 0 REM 0 0 0 0 0    NREM 0 0 0 0 0  Cen 0 REM 0 0 0 0 0    NREM 0 0 0 0 0   Rera Summary Sub Supine Side Prone Upright  Total 0 Total 0 0 0 0 0    REM 0 0 0 0 0    NREM 0 0 0 0 0   Hypopnea Summary Sub Supine Side Prone Upright  Total 0 Total 0 0 0 0 0    REM 0 0 0 0 0    NREM 0 0 0 0 0   4% Hypopnea Summary Sub Supine Side Prone Upright  Total (4%) 0 Total 0 0 0 0 0    REM 0 0 0 0 0    NREM 0 0 0 0 0     AHI Total Obs Mix Cen  0.00 Apnea 0.00 0.00 0.00 0.00   Hypopnea 0.00 -- -- --  0.00 Hypopnea (4%) 0.00 -- -- --    Total Supine Side Prone Upright  Position AHI 0.00 0.00 0.00 0.00 0.00  REM AHI 0.00   NREM AHI 0.00  Position RDI 0.00 0.00 0.00 0.00 0.00  REM RDI 0.00    NREM RDI 0.00    4% Hypopnea Total Supine Side Prone Upright  Position AHI (4%) 0.00 0.00 0.00 0.00 0.00  REM AHI (4%) 0.00   NREM AHI (4%) 0.00   Position RDI (4%) 0.00 0.00 0.00 0.00 0.00  REM RDI (4%) 0.00   NREM RDI (4%) 0.00    Desaturation Information Threshold: 2% <100% <90% <80% <70% <60% <50% <40%  Supine 6.0 0.0 0.0 0.0 0.0 0.0 0.0  Side 35.0 0.0 0.0 0.0 0.0 0.0 0.0  Prone 0.0 0.0 0.0 0.0 0.0 0.0 0.0  Upright 0.0 0.0 0.0 0.0 0.0 0.0 0.0  Total 41.0 0.0 0.0 0.0 0.0 0.0 0.0  Index 6.3 0.0 0.0 0.0 0.0 0.0 0.0   Threshold: 3% <100% <90% <80% <70% <60% <50% <40%  Supine 5.0 0.0 0.0 0.0 0.0 0.0 0.0  Side 0.0 0.0 0.0 0.0 0.0 0.0 0.0  Prone 0.0 0.0 0.0 0.0 0.0 0.0 0.0  Upright 0.0 0.0 0.0 0.0 0.0 0.0 0.0  Total 5.0 0.0 0.0 0.0 0.0 0.0 0.0  Index 0.8 0.0 0.0 0.0 0.0 0.0 0.0   Threshold: 4% <100% <90% <80% <70% <60% <50% <40%  Supine 2.0 0.0 0.0 0.0 0.0 0.0 0.0  Side 0.0 0.0 0.0 0.0 0.0 0.0 0.0  Prone 0.0 0.0 0.0 0.0 0.0 0.0 0.0  Upright 0.0 0.0 0.0 0.0 0.0 0.0 0.0  Total 2.0 0.0 0.0 0.0 0.0 0.0 0.0  Index 0.3 0.0 0.0 0.0 0.0 0.0 0.0   Threshold: 3% <100% <90% <80% <70% <60% <50% <40%  Supine 5 0 0 0 0 0 0  Side 0 0 0 0 0 0 0  Prone 0 0 0 0 0 0 0  Upright 0 0 0 0 0 0 0  Total 5 0 0 0 0 0 0   Awakening/Arousal Information # of Awakenings 9  Wake after sleep onset 8.81m  Wake after persistent sleep 8.39m   Arousal Assoc. Arousals Index  Apneas 0 0.0  Hypopneas 0 0.0  Leg Movements 0 0.0  Snore 0 0.0  PTT Arousals 0 0.0  Spontaneous 19 3.0  Total 19 3.0  Leg Movement Information PLMS LMs Index  Total LMs during PLMS 0 0.0  LMs w/ Microarousals 0 0.0   LM LMs Index  w/ Microarousal 0 0.0  w/ Awakening 0 0.0  w/ Resp Event 0 0.0  Spontaneous 0 0.0  Total 0 0.0     Desaturation threshold setting: 3% Minimum desaturation setting: 10 seconds SaO2 nadir: 74% The longest event was a 0 sec N/A Apnea with a minimum SaO2 of --%. The lowest SaO2 was --%  associated with a 00 sec N/A Apnea. EKG Rates EKG Avg Max Min  Awake 74 89 69  Asleep 73 90 66  EKG Events: N/A

## 2022-12-22 DIAGNOSIS — M6281 Muscle weakness (generalized): Secondary | ICD-10-CM | POA: Diagnosis not present

## 2022-12-22 DIAGNOSIS — R2681 Unsteadiness on feet: Secondary | ICD-10-CM | POA: Diagnosis not present

## 2022-12-22 DIAGNOSIS — M25662 Stiffness of left knee, not elsewhere classified: Secondary | ICD-10-CM | POA: Diagnosis not present

## 2022-12-22 DIAGNOSIS — Z4789 Encounter for other orthopedic aftercare: Secondary | ICD-10-CM | POA: Diagnosis not present

## 2022-12-22 DIAGNOSIS — M25562 Pain in left knee: Secondary | ICD-10-CM | POA: Diagnosis not present

## 2022-12-26 DIAGNOSIS — F419 Anxiety disorder, unspecified: Secondary | ICD-10-CM | POA: Diagnosis not present

## 2022-12-26 DIAGNOSIS — G4700 Insomnia, unspecified: Secondary | ICD-10-CM | POA: Diagnosis not present

## 2022-12-26 DIAGNOSIS — F314 Bipolar disorder, current episode depressed, severe, without psychotic features: Secondary | ICD-10-CM | POA: Diagnosis not present

## 2023-01-06 DIAGNOSIS — M1712 Unilateral primary osteoarthritis, left knee: Secondary | ICD-10-CM | POA: Diagnosis not present

## 2023-01-06 DIAGNOSIS — M1711 Unilateral primary osteoarthritis, right knee: Secondary | ICD-10-CM | POA: Diagnosis not present

## 2023-01-06 DIAGNOSIS — M170 Bilateral primary osteoarthritis of knee: Secondary | ICD-10-CM | POA: Diagnosis not present

## 2023-01-06 DIAGNOSIS — Z4789 Encounter for other orthopedic aftercare: Secondary | ICD-10-CM | POA: Diagnosis not present

## 2023-01-07 DIAGNOSIS — K580 Irritable bowel syndrome with diarrhea: Secondary | ICD-10-CM | POA: Diagnosis not present

## 2023-01-07 DIAGNOSIS — Z86010 Personal history of colonic polyps: Secondary | ICD-10-CM | POA: Diagnosis not present

## 2023-01-07 DIAGNOSIS — K2100 Gastro-esophageal reflux disease with esophagitis, without bleeding: Secondary | ICD-10-CM | POA: Diagnosis not present

## 2023-01-24 DIAGNOSIS — R21 Rash and other nonspecific skin eruption: Secondary | ICD-10-CM | POA: Diagnosis not present

## 2023-01-24 DIAGNOSIS — L239 Allergic contact dermatitis, unspecified cause: Secondary | ICD-10-CM | POA: Diagnosis not present

## 2023-02-02 DIAGNOSIS — F314 Bipolar disorder, current episode depressed, severe, without psychotic features: Secondary | ICD-10-CM | POA: Diagnosis not present

## 2023-02-02 DIAGNOSIS — F419 Anxiety disorder, unspecified: Secondary | ICD-10-CM | POA: Diagnosis not present

## 2023-02-17 DIAGNOSIS — Z0000 Encounter for general adult medical examination without abnormal findings: Secondary | ICD-10-CM | POA: Diagnosis not present

## 2023-02-17 DIAGNOSIS — Z6831 Body mass index (BMI) 31.0-31.9, adult: Secondary | ICD-10-CM | POA: Diagnosis not present

## 2023-02-17 DIAGNOSIS — Z1339 Encounter for screening examination for other mental health and behavioral disorders: Secondary | ICD-10-CM | POA: Diagnosis not present

## 2023-02-17 DIAGNOSIS — Z79899 Other long term (current) drug therapy: Secondary | ICD-10-CM | POA: Diagnosis not present

## 2023-02-17 DIAGNOSIS — R5383 Other fatigue: Secondary | ICD-10-CM | POA: Diagnosis not present

## 2023-02-17 DIAGNOSIS — F419 Anxiety disorder, unspecified: Secondary | ICD-10-CM | POA: Diagnosis not present

## 2023-02-17 DIAGNOSIS — E785 Hyperlipidemia, unspecified: Secondary | ICD-10-CM | POA: Diagnosis not present

## 2023-02-23 DIAGNOSIS — E871 Hypo-osmolality and hyponatremia: Secondary | ICD-10-CM | POA: Diagnosis not present

## 2023-02-24 DIAGNOSIS — F419 Anxiety disorder, unspecified: Secondary | ICD-10-CM | POA: Diagnosis not present

## 2023-02-24 DIAGNOSIS — G4700 Insomnia, unspecified: Secondary | ICD-10-CM | POA: Diagnosis not present

## 2023-02-24 DIAGNOSIS — F314 Bipolar disorder, current episode depressed, severe, without psychotic features: Secondary | ICD-10-CM | POA: Diagnosis not present

## 2023-02-25 DIAGNOSIS — M1712 Unilateral primary osteoarthritis, left knee: Secondary | ICD-10-CM | POA: Diagnosis not present

## 2023-03-02 DIAGNOSIS — F3130 Bipolar disorder, current episode depressed, mild or moderate severity, unspecified: Secondary | ICD-10-CM | POA: Diagnosis not present

## 2023-03-02 DIAGNOSIS — Z6832 Body mass index (BMI) 32.0-32.9, adult: Secondary | ICD-10-CM | POA: Diagnosis not present

## 2023-03-03 DIAGNOSIS — M1712 Unilateral primary osteoarthritis, left knee: Secondary | ICD-10-CM | POA: Diagnosis not present

## 2023-03-04 DIAGNOSIS — F4310 Post-traumatic stress disorder, unspecified: Secondary | ICD-10-CM | POA: Diagnosis not present

## 2023-03-09 DIAGNOSIS — E871 Hypo-osmolality and hyponatremia: Secondary | ICD-10-CM | POA: Diagnosis not present

## 2023-03-11 DIAGNOSIS — M1712 Unilateral primary osteoarthritis, left knee: Secondary | ICD-10-CM | POA: Diagnosis not present

## 2023-03-24 DIAGNOSIS — E871 Hypo-osmolality and hyponatremia: Secondary | ICD-10-CM | POA: Diagnosis not present

## 2023-03-27 DIAGNOSIS — S30814A Abrasion of vagina and vulva, initial encounter: Secondary | ICD-10-CM | POA: Diagnosis not present

## 2023-04-08 DIAGNOSIS — F4310 Post-traumatic stress disorder, unspecified: Secondary | ICD-10-CM | POA: Diagnosis not present

## 2023-04-15 DIAGNOSIS — E871 Hypo-osmolality and hyponatremia: Secondary | ICD-10-CM | POA: Diagnosis not present

## 2023-04-15 DIAGNOSIS — M19071 Primary osteoarthritis, right ankle and foot: Secondary | ICD-10-CM | POA: Diagnosis not present

## 2023-04-22 DIAGNOSIS — F4310 Post-traumatic stress disorder, unspecified: Secondary | ICD-10-CM | POA: Diagnosis not present

## 2023-05-04 DIAGNOSIS — E871 Hypo-osmolality and hyponatremia: Secondary | ICD-10-CM | POA: Diagnosis not present

## 2023-05-06 DIAGNOSIS — F4310 Post-traumatic stress disorder, unspecified: Secondary | ICD-10-CM | POA: Diagnosis not present

## 2023-05-14 DIAGNOSIS — F419 Anxiety disorder, unspecified: Secondary | ICD-10-CM | POA: Diagnosis not present

## 2023-05-14 DIAGNOSIS — Z5181 Encounter for therapeutic drug level monitoring: Secondary | ICD-10-CM | POA: Diagnosis not present

## 2023-05-14 DIAGNOSIS — F314 Bipolar disorder, current episode depressed, severe, without psychotic features: Secondary | ICD-10-CM | POA: Diagnosis not present

## 2023-05-14 DIAGNOSIS — G4700 Insomnia, unspecified: Secondary | ICD-10-CM | POA: Diagnosis not present

## 2023-05-21 DIAGNOSIS — R197 Diarrhea, unspecified: Secondary | ICD-10-CM | POA: Diagnosis not present

## 2023-05-21 DIAGNOSIS — Z6834 Body mass index (BMI) 34.0-34.9, adult: Secondary | ICD-10-CM | POA: Diagnosis not present

## 2023-05-21 DIAGNOSIS — Z23 Encounter for immunization: Secondary | ICD-10-CM | POA: Diagnosis not present

## 2023-05-25 DIAGNOSIS — F4310 Post-traumatic stress disorder, unspecified: Secondary | ICD-10-CM | POA: Diagnosis not present

## 2023-06-02 DIAGNOSIS — E871 Hypo-osmolality and hyponatremia: Secondary | ICD-10-CM | POA: Diagnosis not present

## 2023-06-02 DIAGNOSIS — R197 Diarrhea, unspecified: Secondary | ICD-10-CM | POA: Diagnosis not present

## 2023-06-08 DIAGNOSIS — F4310 Post-traumatic stress disorder, unspecified: Secondary | ICD-10-CM | POA: Diagnosis not present

## 2023-06-22 DIAGNOSIS — F4310 Post-traumatic stress disorder, unspecified: Secondary | ICD-10-CM | POA: Diagnosis not present

## 2023-06-23 DIAGNOSIS — Z1231 Encounter for screening mammogram for malignant neoplasm of breast: Secondary | ICD-10-CM | POA: Diagnosis not present

## 2023-06-24 DIAGNOSIS — K580 Irritable bowel syndrome with diarrhea: Secondary | ICD-10-CM | POA: Diagnosis not present

## 2023-06-24 DIAGNOSIS — Z6835 Body mass index (BMI) 35.0-35.9, adult: Secondary | ICD-10-CM | POA: Diagnosis not present

## 2023-06-30 DIAGNOSIS — E871 Hypo-osmolality and hyponatremia: Secondary | ICD-10-CM | POA: Diagnosis not present

## 2023-06-30 DIAGNOSIS — Z1231 Encounter for screening mammogram for malignant neoplasm of breast: Secondary | ICD-10-CM | POA: Diagnosis not present

## 2023-07-21 DIAGNOSIS — M533 Sacrococcygeal disorders, not elsewhere classified: Secondary | ICD-10-CM | POA: Diagnosis not present

## 2023-07-21 DIAGNOSIS — Z6837 Body mass index (BMI) 37.0-37.9, adult: Secondary | ICD-10-CM | POA: Diagnosis not present

## 2023-07-23 ENCOUNTER — Encounter (HOSPITAL_BASED_OUTPATIENT_CLINIC_OR_DEPARTMENT_OTHER): Payer: Self-pay

## 2023-07-23 ENCOUNTER — Emergency Department (HOSPITAL_BASED_OUTPATIENT_CLINIC_OR_DEPARTMENT_OTHER): Payer: PPO

## 2023-07-23 ENCOUNTER — Emergency Department (HOSPITAL_COMMUNITY): Payer: PPO

## 2023-07-23 ENCOUNTER — Emergency Department (HOSPITAL_BASED_OUTPATIENT_CLINIC_OR_DEPARTMENT_OTHER)
Admission: EM | Admit: 2023-07-23 | Discharge: 2023-07-23 | Disposition: A | Discharge status: Home / Self Care | Payer: PPO | Attending: Emergency Medicine | Admitting: Emergency Medicine

## 2023-07-23 DIAGNOSIS — M5116 Intervertebral disc disorders with radiculopathy, lumbar region: Secondary | ICD-10-CM | POA: Diagnosis not present

## 2023-07-23 DIAGNOSIS — Z79899 Other long term (current) drug therapy: Secondary | ICD-10-CM | POA: Insufficient documentation

## 2023-07-23 DIAGNOSIS — M2548 Effusion, other site: Secondary | ICD-10-CM | POA: Diagnosis not present

## 2023-07-23 DIAGNOSIS — I700 Atherosclerosis of aorta: Secondary | ICD-10-CM | POA: Diagnosis not present

## 2023-07-23 DIAGNOSIS — M5126 Other intervertebral disc displacement, lumbar region: Secondary | ICD-10-CM | POA: Diagnosis not present

## 2023-07-23 DIAGNOSIS — M545 Low back pain, unspecified: Secondary | ICD-10-CM | POA: Diagnosis not present

## 2023-07-23 HISTORY — DX: Other specified health status: Z78.9

## 2023-07-23 LAB — PREGNANCY, URINE: Preg Test, Ur: NEGATIVE

## 2023-07-23 LAB — URINALYSIS, ROUTINE W REFLEX MICROSCOPIC
Bilirubin Urine: NEGATIVE
Glucose, UA: NEGATIVE mg/dL
Hgb urine dipstick: NEGATIVE
Ketones, ur: NEGATIVE mg/dL
Leukocytes,Ua: NEGATIVE
Nitrite: NEGATIVE
Protein, ur: NEGATIVE mg/dL
Specific Gravity, Urine: 1.015 (ref 1.005–1.030)
pH: 6.5 (ref 5.0–8.0)

## 2023-07-23 MED ORDER — DIAZEPAM 5 MG PO TABS: 5.0000 mg | ORAL_TABLET | Freq: Two times a day (BID) | ORAL | 0 refills | Status: DC

## 2023-07-23 MED ORDER — OXYCODONE HCL 5 MG PO TABS: 5.0000 mg | ORAL_TABLET | ORAL | 0 refills | Status: DC | PRN

## 2023-07-23 MED ORDER — LIDOCAINE 5 % EX PTCH: 1.0000 | MEDICATED_PATCH | CUTANEOUS | 0 refills | Status: DC

## 2023-07-23 MED ORDER — ACETAMINOPHEN 500 MG PO TABS
1000.0000 mg | ORAL_TABLET | Freq: Once | ORAL | Status: AC
  Administered 2023-07-23: 1000 mg via ORAL
  Filled 2023-07-23: qty 2

## 2023-07-23 MED ORDER — ONDANSETRON HCL 4 MG/2ML IJ SOLN: 4.0000 mg | Freq: Once | INTRAMUSCULAR | Status: DC

## 2023-07-23 MED ORDER — KETOROLAC TROMETHAMINE 30 MG/ML IJ SOLN
30.0000 mg | Freq: Once | INTRAMUSCULAR | Status: DC
  Filled 2023-07-23: qty 1

## 2023-07-23 MED ORDER — OXYCODONE-ACETAMINOPHEN 5-325 MG PO TABS
1.0000 | ORAL_TABLET | Freq: Once | ORAL | Status: AC
  Administered 2023-07-23: 1 via ORAL
  Filled 2023-07-23: qty 1

## 2023-07-23 MED ORDER — CYCLOBENZAPRINE HCL 10 MG PO TABS
10.0000 mg | ORAL_TABLET | Freq: Once | ORAL | Status: AC
  Administered 2023-07-23: 10 mg via ORAL
  Filled 2023-07-23: qty 1

## 2023-07-23 MED ORDER — LIDOCAINE 5 % EX PTCH
1.0000 | MEDICATED_PATCH | CUTANEOUS | Status: DC
  Administered 2023-07-23: 1 via TRANSDERMAL
  Filled 2023-07-23: qty 1

## 2023-07-23 MED ORDER — HYDROMORPHONE HCL 1 MG/ML IJ SOLN
1.0000 mg | Freq: Once | INTRAMUSCULAR | Status: AC
  Administered 2023-07-23: 1 mg via INTRAVENOUS
  Filled 2023-07-23: qty 1

## 2023-07-23 MED ORDER — ONDANSETRON HCL 4 MG/2ML IJ SOLN
4.0000 mg | Freq: Once | INTRAMUSCULAR | Status: AC
  Administered 2023-07-23: 4 mg via INTRAVENOUS
  Filled 2023-07-23: qty 2

## 2023-07-23 MED ORDER — DIPHENHYDRAMINE HCL 25 MG PO CAPS
25.0000 mg | ORAL_CAPSULE | Freq: Once | ORAL | Status: AC
  Administered 2023-07-23: 25 mg via ORAL
  Filled 2023-07-23: qty 1

## 2023-07-23 NOTE — ED Notes (Signed)
Arrived from [facility] as a transfer POV.  Awaiting in lobby for MRI, MRI notified

## 2023-07-23 NOTE — ED Notes (Signed)
Pt informed to head straight to [facility] ED, do not stop anywhere else. Informed to give confidential information packet to ED staff upon arrival. Pt expressed understanding of information.

## 2023-07-23 NOTE — ED Triage Notes (Addendum)
Pt reports lower right back pain for 2 weeks. Seen by PCP given medrol pack for SI joint 07/21/23 and back feels worse. Reports falling out bed this morning due to pain Numbness right great toe. Vomited all morning meds

## 2023-07-23 NOTE — ED Provider Notes (Signed)
  Physical Exam  BP (!) 142/97   Pulse 85   Temp 97.7 F (36.5 C)   Resp 16   Ht 5\' 3"  (1.6 m)   Wt 95.3 kg   LMP 07/12/2023 Comment: neg upreg in er 07/23/23  SpO2 98%   BMI 37.20 kg/m   Physical Exam  Procedures  Procedures  ED Course / MDM   Clinical Course as of 07/23/23 2041  Thu Jul 23, 2023  1138 UA is negative.  Pregnancy test negative.  CT L-spine shows disc extrusion at L4-L5 with narrowing and recommend lumbar spine MRI.  Unfortunately we do not have MRI available at this facility today.  She will need to go to the [facility], ED to have the MRI taken. Dr Denton Lank [facility] ED) accepts patient in transfer.  She will transfer via private vehicle. [MP]    Clinical Course User Index [MP] Royanne Foots, DO   Medical Decision Making Amount and/or Complexity of Data Reviewed Labs: ordered. Radiology: ordered.  Risk OTC drugs. Prescription drug management.   42 year old female coming to the emergency department as a transfer from [facility].  Patient presented with back pain.  CT imaging recommended MRI so she was sent here.  Patient with radiculopathy.  She has no weakness in her lower extremities, does have diminished sensation in her great toe.  MRI shows disc bulge affecting L5.  With duration of symptoms, should continue to be managed medically.  Patient is actually following up with her orthopedist tomorrow and has seen their spine doctor there.  She is going to establish care tomorrow.  Will discharge with analgesia.  On exam, no signs or symptoms of cord compression       Anders Simmonds T, DO 07/23/23 2042

## 2023-07-23 NOTE — Discharge Instructions (Addendum)
Follow-up with your orthopedic doctor tomorrow.  While you are there, you can share your MRI report with them.  Regarding your pain, I would like you to take 1000 mg of Tylenol every 8 hours, 400 mg of ibuprofen every 6 hours.  You can take 5 mg of oxycodone every 6 hours.  You can take Valium as needed before bed.  Do not take the oxycodone and Valium together at the same time as these medicines can be sedating.  Return to the emergency department if you develop difficulty going to the bathroom, or weakness.

## 2023-07-23 NOTE — ED Provider Notes (Signed)
[facility] Provider Note   CSN: [id number] Arrival date & time: 07/23/23  0809     History  Chief Complaint  Patient presents with   Back Pain    Kayla Holden is a 42 y.o. female.  Who presents to the ED for lower back pain.  She first experienced right lower back pain 2 weeks ago.  For this reason she was seen by her PCP and started on a Medrol Dosepak 2 days ago which is provided minimal relief.  She has been taking hydrocodone at home but is in severe pain.  She was unable to dress herself this morning due to right lower back pain.  She also voiced concern for numbness in the right great toe today.  No prior history of back injury or recent trauma.  No history of back surgeries.  Denies urinary retention/incontinence, fecal incontinence, IV drug use, fevers, chills and focal motor deficits.  She is scheduled to have a steroid injection in 1 week through her PCP office for suspected SI pain.   Back Pain      Home Medications Prior to Admission medications   Medication Sig Start Date End Date Taking? Authorizing Provider  cyanocobalamin 1000 MCG tablet Take 1 tablet (1,000 mcg total) by mouth daily. 03/17/22  Yes Clapacs, Jackquline Denmark, MD  diazepam (VALIUM) 5 MG/ML solution Take 5 mg by mouth every 8 (eight) hours as needed for anxiety.   Yes [provider]  dicyclomine (BENTYL) 20 MG tablet Take 2 tablets (40 mg total) by mouth 4 (four) times daily as needed for spasms. 03/17/22  Yes Clapacs, Jackquline Denmark, MD  diphenoxylate-atropine (LOMOTIL) 2.5-0.025 MG tablet Take 1 tablet by mouth 4 (four) times daily as needed for diarrhea or loose stools.   Yes [provider]  dronabinol (MARINOL) 5 MG capsule Take 1 capsule (5 mg total) by mouth 2 (two) times daily. 03/17/22  Yes Clapacs, Jackquline Denmark, MD  drospirenone-ethinyl estradiol (YAZ) 3-0.02 MG tablet Take 1 tablet by mouth daily with lunch.   Yes [provider]  hydrOXYzine  (ATARAX) 25 MG tablet Take 25 mg by mouth every 6 (six) hours as needed.   Yes [provider]  lurasidone (LATUDA) 40 MG TABS tablet Take 1 tablet (40 mg total) by mouth daily with supper. 03/17/22  Yes Clapacs, Jackquline Denmark, MD  ondansetron (ZOFRAN-ODT) 8 MG disintegrating tablet Take 1 tablet (8 mg total) by mouth every 8 (eight) hours as needed for nausea (DISSOLVE ORALLY). 03/17/22  Yes Clapacs, Jackquline Denmark, MD  pantoprazole (PROTONIX) 40 MG tablet Take 2 tablets (80 mg total) by mouth 2 (two) times daily. 03/17/22  Yes Clapacs, Jackquline Denmark, MD  propranolol (INDERAL) 10 MG tablet Take 1 tablet (10 mg total) by mouth 3 (three) times daily. 03/17/22  Yes Clapacs, Jackquline Denmark, MD  rosuvastatin (CRESTOR) 10 MG tablet Take 1 tablet (10 mg total) by mouth at bedtime. 03/17/22  Yes Clapacs, Jackquline Denmark, MD  spironolactone (ALDACTONE) 100 MG tablet Take 1 tablet (100 mg total) by mouth daily at 12 noon. 03/17/22  Yes Clapacs, Jackquline Denmark, MD  zolpidem (AMBIEN CR) 12.5 MG CR tablet Take 1 tablet (12.5 mg total) by mouth at bedtime. 03/17/22  Yes Clapacs, Jackquline Denmark, MD  Ascorbic Acid (VITA-C PO) Take 2 tablets by mouth daily.    [provider]  cholecalciferol (CHOLECALCIFEROL) 25 MCG tablet Take 1 tablet (1,000 Units total) by mouth daily. 03/17/22   Clapacs,  Jackquline Denmark, MD  traMADol (ULTRAM) 50 MG tablet Take 50 mg by mouth in the morning, at noon, and at bedtime. 09/15/22   [provider]      Allergies    Codeine, Fioricet-codeine [butalbital-apap-caff-cod], Isoniazid, Ketorolac tromethamine, Rifampin, Tape, Clindamycin, Ketoprofen, Nsaids, and Doxycycline    Review of Systems   Review of Systems  Musculoskeletal:  Positive for back pain.    Physical Exam Updated Vital Signs BP (!) 141/91   Pulse 61   Temp 99 F (37.2 C)   Resp 17   Ht 5\' 3"  (1.6 m)   Wt 95.3 kg   LMP 07/12/2023 Comment: neg upreg in er 07/23/23  SpO2 100%   BMI 37.20 kg/m  Physical Exam Vitals and nursing note reviewed.  HENT:      Head: Normocephalic and atraumatic.  Eyes:     Pupils: Pupils are equal, round, and reactive to light.  Cardiovascular:     Rate and Rhythm: Normal rate and regular rhythm.  Pulmonary:     Effort: Pulmonary effort is normal.     Breath sounds: Normal breath sounds.  Abdominal:     Palpations: Abdomen is soft.     Tenderness: There is no abdominal tenderness.  Musculoskeletal:     Comments: Tenderness to palpation of superior right buttock, superior lateral right buttock and anterolateral right thigh No midline tenderness step-off deformity Full active range of motion with flexion/extension/rotation of cervical spine Sensation intact to light touch throughout 5 out of 5 motor strength bilateral lower extremities 2+ DP pulse bilaterally  Skin:    General: Skin is warm and dry.  Neurological:     Mental Status: She is alert.  Psychiatric:        Mood and Affect: Mood normal.     ED Results / Procedures / Treatments   Labs (all labs ordered are listed, but only abnormal results are displayed) Labs Reviewed  PREGNANCY, URINE  URINALYSIS, ROUTINE W REFLEX MICROSCOPIC    EKG None  Radiology CT Lumbar Spine Wo Contrast  Result Date: 07/23/2023 CLINICAL DATA:  Low back pain, no red flags, no prior management R lower back pain EXAM: CT LUMBAR SPINE WITHOUT CONTRAST TECHNIQUE: Multidetector CT imaging of the lumbar spine was performed without intravenous contrast administration. Multiplanar CT image reconstructions were also generated. RADIATION DOSE REDUCTION: This exam was performed according to the departmental dose-optimization program which includes automated exposure control, adjustment of the mA and/or kV according to patient size and/or use of iterative reconstruction technique. COMPARISON:  None Available. FINDINGS: Segmentation: 5 lumbar type vertebrae. Alignment: Normal. Vertebrae: No acute fracture or focal pathologic process. Paraspinal and other soft tissues: Aortic  atherosclerotic calcifications. Disc levels: There is a right paracentral disc extrusion at L4-L5 that severely narrows right lateral recess and causes mass effect on the descending right L5 root. IMPRESSION: Right paracentral disc extrusion at L4-L5 that severely narrows right lateral recess and causes mass effect on the descending right L5 root. Recommend further evaluation with a lumbar spine MRI Aortic Atherosclerosis (ICD10-I70.0). Electronically Signed   By: Lorenza Cambridge M.D.   On: 07/23/2023 10:32    Procedures Procedures    Medications Ordered in ED Medications  lidocaine (LIDODERM) 5 % 1 patch (1 patch Transdermal Patch Applied 07/23/23 0926)  cyclobenzaprine (FLEXERIL) tablet 10 mg (10 mg Oral Given 07/23/23 0926)  oxyCODONE-acetaminophen (PERCOCET/ROXICET) 5-325 MG per tablet 1 tablet (1 tablet Oral Given 07/23/23 0926)  oxyCODONE-acetaminophen (PERCOCET/ROXICET) 5-325 MG per tablet 1 tablet (1  tablet Oral Given 07/23/23 1150)  diphenhydrAMINE (BENADRYL) capsule 25 mg (25 mg Oral Given 07/23/23 1150)    ED Course/ Medical Decision Making/ A&P Clinical Course as of 07/23/23 1204  Thu Jul 23, 2023  1138 UA is negative.  Pregnancy test negative.  CT L-spine shows disc extrusion at L4-L5 with narrowing and recommend lumbar spine MRI.  Unfortunately we do not have MRI available at this facility today.  She will need to go to the [facility], ED to have the MRI taken. Dr Denton Lank [facility] ED) accepts patient in transfer.  She will transfer via private vehicle. [MP]    Clinical Course User Index [MP] Royanne Foots, DO                                 Medical Decision Making 42 year old female with history as above presenting for 2 weeks of right lower back pain.  Afebrile slightly hypotensive.  Uncomfortable appearing laying on her left side.  Exam notable for tenderness over the right buttock, lateral right hip and proximal right thigh.  No midline tenderness, focal motor or sensory deficits  and no red flag symptoms on her history that would prompt concern for spinal cord involvement at this time.  This is most likely musculoskeletal strain sprain of the right gluteus, piriformis or other intrinsic muscle of the right lower back/proximal right lower extremity.  Given that she has not had prior imaging and does have some bony tenderness over the SI joint will evaluate for potential osseous injury with CT lumbar spine without contrast.  Will attempt symptomatic management with Flexeril/lidocaine patch/1 dose of opioid analgesic p.o. and reassess  Amount and/or Complexity of Data Reviewed Labs: ordered. Radiology: ordered.  Risk Prescription drug management.           Final Clinical Impression(s) / ED Diagnoses Final diagnoses:  Lumbar disc herniation  Acute right-sided low back pain without sciatica    Rx / DC Orders ED Discharge Orders     None         Royanne Foots, DO 07/23/23 1204

## 2023-07-24 DIAGNOSIS — M25562 Pain in left knee: Secondary | ICD-10-CM | POA: Diagnosis not present

## 2023-07-27 DIAGNOSIS — Z6837 Body mass index (BMI) 37.0-37.9, adult: Secondary | ICD-10-CM | POA: Diagnosis not present

## 2023-07-27 DIAGNOSIS — Z01419 Encounter for gynecological examination (general) (routine) without abnormal findings: Secondary | ICD-10-CM | POA: Diagnosis not present

## 2023-07-27 DIAGNOSIS — M5126 Other intervertebral disc displacement, lumbar region: Secondary | ICD-10-CM | POA: Diagnosis not present

## 2023-07-28 DIAGNOSIS — M5116 Intervertebral disc disorders with radiculopathy, lumbar region: Secondary | ICD-10-CM | POA: Diagnosis not present

## 2023-08-03 DIAGNOSIS — F4310 Post-traumatic stress disorder, unspecified: Secondary | ICD-10-CM | POA: Diagnosis not present

## 2023-08-07 DIAGNOSIS — M5126 Other intervertebral disc displacement, lumbar region: Secondary | ICD-10-CM | POA: Diagnosis not present

## 2023-08-08 HISTORY — PX: BACK SURGERY: SHX140

## 2023-08-25 DIAGNOSIS — E871 Hypo-osmolality and hyponatremia: Secondary | ICD-10-CM | POA: Diagnosis not present

## 2023-08-25 DIAGNOSIS — Z6837 Body mass index (BMI) 37.0-37.9, adult: Secondary | ICD-10-CM | POA: Diagnosis not present

## 2023-08-25 DIAGNOSIS — N870 Mild cervical dysplasia: Secondary | ICD-10-CM | POA: Diagnosis not present

## 2023-08-25 DIAGNOSIS — R87612 Low grade squamous intraepithelial lesion on cytologic smear of cervix (LGSIL): Secondary | ICD-10-CM | POA: Diagnosis not present

## 2023-08-28 DIAGNOSIS — N939 Abnormal uterine and vaginal bleeding, unspecified: Secondary | ICD-10-CM | POA: Diagnosis not present

## 2023-08-28 DIAGNOSIS — Z6837 Body mass index (BMI) 37.0-37.9, adult: Secondary | ICD-10-CM | POA: Diagnosis not present

## 2023-09-09 DIAGNOSIS — M2242 Chondromalacia patellae, left knee: Secondary | ICD-10-CM | POA: Diagnosis not present

## 2023-09-18 DIAGNOSIS — Z5189 Encounter for other specified aftercare: Secondary | ICD-10-CM | POA: Diagnosis not present

## 2023-09-23 DIAGNOSIS — Z6837 Body mass index (BMI) 37.0-37.9, adult: Secondary | ICD-10-CM | POA: Diagnosis not present

## 2023-09-23 DIAGNOSIS — B354 Tinea corporis: Secondary | ICD-10-CM | POA: Diagnosis not present

## 2023-09-24 DIAGNOSIS — M545 Low back pain, unspecified: Secondary | ICD-10-CM | POA: Diagnosis not present

## 2023-09-29 DIAGNOSIS — M545 Low back pain, unspecified: Secondary | ICD-10-CM | POA: Diagnosis not present

## 2023-10-06 DIAGNOSIS — M545 Low back pain, unspecified: Secondary | ICD-10-CM | POA: Diagnosis not present

## 2023-10-12 DIAGNOSIS — L814 Other melanin hyperpigmentation: Secondary | ICD-10-CM | POA: Diagnosis not present

## 2023-10-12 DIAGNOSIS — B351 Tinea unguium: Secondary | ICD-10-CM | POA: Diagnosis not present

## 2023-10-12 DIAGNOSIS — D2262 Melanocytic nevi of left upper limb, including shoulder: Secondary | ICD-10-CM | POA: Diagnosis not present

## 2023-10-12 DIAGNOSIS — D225 Melanocytic nevi of trunk: Secondary | ICD-10-CM | POA: Diagnosis not present

## 2023-10-12 DIAGNOSIS — M545 Low back pain, unspecified: Secondary | ICD-10-CM | POA: Diagnosis not present

## 2023-10-12 DIAGNOSIS — D1801 Hemangioma of skin and subcutaneous tissue: Secondary | ICD-10-CM | POA: Diagnosis not present

## 2023-10-12 DIAGNOSIS — L732 Hidradenitis suppurativa: Secondary | ICD-10-CM | POA: Diagnosis not present

## 2023-10-12 DIAGNOSIS — D2261 Melanocytic nevi of right upper limb, including shoulder: Secondary | ICD-10-CM | POA: Diagnosis not present

## 2023-10-12 DIAGNOSIS — L821 Other seborrheic keratosis: Secondary | ICD-10-CM | POA: Diagnosis not present

## 2023-10-12 DIAGNOSIS — D224 Melanocytic nevi of scalp and neck: Secondary | ICD-10-CM | POA: Diagnosis not present

## 2023-10-13 DIAGNOSIS — E871 Hypo-osmolality and hyponatremia: Secondary | ICD-10-CM | POA: Diagnosis not present

## 2023-10-13 DIAGNOSIS — Z6839 Body mass index (BMI) 39.0-39.9, adult: Secondary | ICD-10-CM | POA: Diagnosis not present

## 2023-10-13 DIAGNOSIS — Z124 Encounter for screening for malignant neoplasm of cervix: Secondary | ICD-10-CM | POA: Diagnosis not present

## 2023-10-19 DIAGNOSIS — M545 Low back pain, unspecified: Secondary | ICD-10-CM | POA: Diagnosis not present

## 2023-10-26 DIAGNOSIS — B351 Tinea unguium: Secondary | ICD-10-CM | POA: Diagnosis not present

## 2023-10-26 DIAGNOSIS — Z6839 Body mass index (BMI) 39.0-39.9, adult: Secondary | ICD-10-CM | POA: Diagnosis not present

## 2023-10-27 ENCOUNTER — Telehealth: Payer: Self-pay | Admitting: Internal Medicine

## 2023-10-27 NOTE — Telephone Encounter (Signed)
 PT is calling to establish care with Dr. Marina Goodell. She has gene CYP2D6. She has been seeing Dr. Charm Barges and said that since he retired, they have closed the office. Records are being sent for review.

## 2023-10-29 ENCOUNTER — Telehealth: Payer: Self-pay | Admitting: Internal Medicine

## 2023-10-29 NOTE — Telephone Encounter (Signed)
 Okay for routine office visit to establish

## 2023-10-29 NOTE — Telephone Encounter (Signed)
 Good morning Dr. Marina Goodell  We received a call from this patient wishing to establish new GI care with you. She is a previous patient of Dr. Charm Barges and was last seen by them in 12/2022. Since then, Dr. Charm Barges has retired and patient would like to be seen with you for IBS management. Records from visits were obtained and scanned into Media for you to review. Would you please advise on scheduling?  Thank you.

## 2023-11-03 DIAGNOSIS — M2242 Chondromalacia patellae, left knee: Secondary | ICD-10-CM | POA: Diagnosis not present

## 2023-11-03 DIAGNOSIS — G8929 Other chronic pain: Secondary | ICD-10-CM | POA: Diagnosis not present

## 2023-11-03 DIAGNOSIS — M7052 Other bursitis of knee, left knee: Secondary | ICD-10-CM | POA: Diagnosis not present

## 2023-11-03 DIAGNOSIS — M25562 Pain in left knee: Secondary | ICD-10-CM | POA: Diagnosis not present

## 2023-11-03 DIAGNOSIS — Z6838 Body mass index (BMI) 38.0-38.9, adult: Secondary | ICD-10-CM | POA: Diagnosis not present

## 2023-11-04 DIAGNOSIS — M25562 Pain in left knee: Secondary | ICD-10-CM | POA: Diagnosis not present

## 2023-11-04 DIAGNOSIS — G8929 Other chronic pain: Secondary | ICD-10-CM | POA: Diagnosis not present

## 2023-11-09 ENCOUNTER — Ambulatory Visit: Admitting: Internal Medicine

## 2023-11-09 ENCOUNTER — Encounter: Payer: Self-pay | Admitting: Internal Medicine

## 2023-11-09 VITALS — BP 122/70 | HR 95 | Ht 63.0 in | Wt 214.0 lb

## 2023-11-09 DIAGNOSIS — E669 Obesity, unspecified: Secondary | ICD-10-CM

## 2023-11-09 DIAGNOSIS — Z860100 Personal history of colon polyps, unspecified: Secondary | ICD-10-CM

## 2023-11-09 DIAGNOSIS — Z860101 Personal history of adenomatous and serrated colon polyps: Secondary | ICD-10-CM

## 2023-11-09 DIAGNOSIS — K580 Irritable bowel syndrome with diarrhea: Secondary | ICD-10-CM

## 2023-11-09 DIAGNOSIS — Z8719 Personal history of other diseases of the digestive system: Secondary | ICD-10-CM | POA: Diagnosis not present

## 2023-11-09 DIAGNOSIS — K589 Irritable bowel syndrome without diarrhea: Secondary | ICD-10-CM

## 2023-11-09 DIAGNOSIS — K219 Gastro-esophageal reflux disease without esophagitis: Secondary | ICD-10-CM | POA: Diagnosis not present

## 2023-11-09 DIAGNOSIS — Z6837 Body mass index (BMI) 37.0-37.9, adult: Secondary | ICD-10-CM

## 2023-11-09 MED ORDER — PANTOPRAZOLE SODIUM 40 MG PO TBEC: 80.0000 mg | DELAYED_RELEASE_TABLET | Freq: Two times a day (BID) | ORAL | 3 refills | Status: DC

## 2023-11-09 MED ORDER — PANTOPRAZOLE SODIUM 40 MG PO TBEC: 80.0000 mg | DELAYED_RELEASE_TABLET | Freq: Two times a day (BID) | ORAL | 3 refills | Status: AC
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
## 2023-11-09 NOTE — Patient Instructions (Signed)
 We have sent the following medications to your pharmacy for you to pick up at your convenience:  Pantoprazole.  Please follow up in one year.  _______________________________________________________  If your blood pressure at your visit was 140/90 or greater, please contact your primary care physician to follow up on this.  _______________________________________________________  If you are age 45 or older, your body mass index should be between 23-30. Your Body mass index is 37.91 kg/m. If this is out of the aforementioned range listed, please consider follow up with your Primary Care Provider.  If you are age 67 or younger, your body mass index should be between 19-25. Your Body mass index is 37.91 kg/m. If this is out of the aformentioned range listed, please consider follow up with your Primary Care Provider.   ________________________________________________________  The [facility] providers would like to encourage you to use [facility] to communicate with providers for non-urgent requests or questions.  Due to long hold times on the telephone, sending your provider a message by [facility] may be a faster and more efficient way to get a response.  Please allow 48 business hours for a response.  Please remember that this is for non-urgent requests.  _______________________________________________________

## 2023-11-10 DIAGNOSIS — G43909 Migraine, unspecified, not intractable, without status migrainosus: Secondary | ICD-10-CM | POA: Diagnosis not present

## 2023-11-10 DIAGNOSIS — Z6838 Body mass index (BMI) 38.0-38.9, adult: Secondary | ICD-10-CM | POA: Diagnosis not present

## 2023-11-11 DIAGNOSIS — M542 Cervicalgia: Secondary | ICD-10-CM | POA: Diagnosis not present

## 2023-11-18 DIAGNOSIS — Z6838 Body mass index (BMI) 38.0-38.9, adult: Secondary | ICD-10-CM | POA: Diagnosis not present

## 2023-11-18 DIAGNOSIS — M25562 Pain in left knee: Secondary | ICD-10-CM | POA: Diagnosis not present

## 2023-11-18 DIAGNOSIS — G8929 Other chronic pain: Secondary | ICD-10-CM | POA: Diagnosis not present

## 2023-11-18 DIAGNOSIS — M2242 Chondromalacia patellae, left knee: Secondary | ICD-10-CM | POA: Diagnosis not present

## 2023-11-23 DIAGNOSIS — F4310 Post-traumatic stress disorder, unspecified: Secondary | ICD-10-CM | POA: Diagnosis not present

## 2023-11-27 DIAGNOSIS — B009 Herpesviral infection, unspecified: Secondary | ICD-10-CM | POA: Diagnosis not present

## 2023-12-01 DIAGNOSIS — F419 Anxiety disorder, unspecified: Secondary | ICD-10-CM | POA: Diagnosis not present

## 2023-12-01 DIAGNOSIS — F314 Bipolar disorder, current episode depressed, severe, without psychotic features: Secondary | ICD-10-CM | POA: Diagnosis not present

## 2023-12-01 DIAGNOSIS — Z5181 Encounter for therapeutic drug level monitoring: Secondary | ICD-10-CM | POA: Diagnosis not present

## 2023-12-07 DIAGNOSIS — F4310 Post-traumatic stress disorder, unspecified: Secondary | ICD-10-CM | POA: Diagnosis not present

## 2023-12-09 DIAGNOSIS — M542 Cervicalgia: Secondary | ICD-10-CM | POA: Diagnosis not present

## 2023-12-21 DIAGNOSIS — M5489 Other dorsalgia: Secondary | ICD-10-CM | POA: Diagnosis not present

## 2023-12-21 DIAGNOSIS — F4310 Post-traumatic stress disorder, unspecified: Secondary | ICD-10-CM | POA: Diagnosis not present

## 2023-12-21 DIAGNOSIS — M2560 Stiffness of unspecified joint, not elsewhere classified: Secondary | ICD-10-CM | POA: Diagnosis not present

## 2023-12-21 DIAGNOSIS — G4489 Other headache syndrome: Secondary | ICD-10-CM | POA: Diagnosis not present

## 2023-12-21 DIAGNOSIS — M25511 Pain in right shoulder: Secondary | ICD-10-CM | POA: Diagnosis not present

## 2023-12-21 DIAGNOSIS — M6281 Muscle weakness (generalized): Secondary | ICD-10-CM | POA: Diagnosis not present

## 2023-12-21 DIAGNOSIS — M542 Cervicalgia: Secondary | ICD-10-CM | POA: Diagnosis not present

## 2023-12-21 DIAGNOSIS — R293 Abnormal posture: Secondary | ICD-10-CM | POA: Diagnosis not present

## 2023-12-29 DIAGNOSIS — F314 Bipolar disorder, current episode depressed, severe, without psychotic features: Secondary | ICD-10-CM | POA: Diagnosis not present

## 2023-12-29 DIAGNOSIS — F419 Anxiety disorder, unspecified: Secondary | ICD-10-CM | POA: Diagnosis not present

## 2024-01-04 DIAGNOSIS — F4310 Post-traumatic stress disorder, unspecified: Secondary | ICD-10-CM | POA: Diagnosis not present

## 2024-01-05 DIAGNOSIS — M542 Cervicalgia: Secondary | ICD-10-CM | POA: Diagnosis not present

## 2024-01-05 DIAGNOSIS — M545 Low back pain, unspecified: Secondary | ICD-10-CM | POA: Diagnosis not present

## 2024-01-05 DIAGNOSIS — G8918 Other acute postprocedural pain: Secondary | ICD-10-CM | POA: Diagnosis not present

## 2024-01-06 DIAGNOSIS — K5909 Other constipation: Secondary | ICD-10-CM | POA: Diagnosis not present

## 2024-01-06 DIAGNOSIS — Z6838 Body mass index (BMI) 38.0-38.9, adult: Secondary | ICD-10-CM | POA: Diagnosis not present

## 2024-01-12 ENCOUNTER — Telehealth: Payer: Self-pay | Admitting: Internal Medicine

## 2024-01-12 NOTE — Telephone Encounter (Signed)
 Pt reports having LUQ abd pain. She was concerned she may have an ulcer. PCP told her to add mylanta to her protonix  tha she takes bid. Reports this has not helped. Pt scheduled to see Suzanna Erp PA 01/13/24@10 :40am Pt aware of appt.

## 2024-01-12 NOTE — Telephone Encounter (Signed)
 Patient called and stated that she was having painful LUQ pain and was thinking she might have a stomach ulcer. Patient stated that she spoke to her PCP and they advise her to take some Mylanta, but patient is stating that she has had no improvements since taking it. Patient is requesting a call back. Please advise.

## 2024-01-12 NOTE — Progress Notes (Signed)
 "  Chief Complaint: LUQ pain Primary GI MD: Dr. Abran  HPI: Discussed the use of AI scribe software for clinical note transcription with the patient, who gave verbal consent to proceed.  History of Present Illness Kayla Holden is a 43 year old female with lymphocytic colitis in remission who presents with left upper quadrant pain. She is accompanied by [name], her fianc.  She has been experiencing left upper quadrant pain for approximately two weeks, describing it as burning and 'on fire.' Initially, she tried Pepto-Bismol, which provided some relief, but the pain persisted, leading her to use Zofran . She has been taking Zofran  regularly, unaware of its potential to cause constipation. Her primary care doctor prescribed a medication to alleviate the constipation and recommended Mylanta, which has not been effective. She continues to take pantoprazole  80 mg twice daily for heartburn and reflux, which is mostly controlled.  The pain worsens with eating, and she does not use NSAIDs due to intolerance. She has experienced black stools, which she attributes to Pepto-Bismol taken three days ago. She has a history of low sodium levels, with a critical point of 128 in the past. She has not had any recent blood work since 2023.  Her past medical history includes lymphocytic colitis, which was treated with budesonide  and is currently in remission. She had an endoscopy over ten years ago at the [facility] due to previous GI issues, but no recent endoscopies have been performed.   Past Medical History:  Diagnosis Date   Anemia    Anxiety    Anxiety    Bipolar 1 disorder (HCC)    CAD (coronary artery disease)    Chiari malformation    Colitis    Colon polyps    Complication of anesthesia    CYP2D6 extensive metabolizer    Depression    Endometriosis    Fatty liver    GERD (gastroesophageal reflux disease)    Hyperlipidemia    Hypertension    IBS (irritable bowel syndrome)    MDD (major  depressive disorder)    Microscopic colitis    Migraine headache    Panic disorder with agoraphobia    PONV (postoperative nausea and vomiting)    Vitamin B 12 deficiency     Past Surgical History:  Procedure Laterality Date   APPENDECTOMY     BACK SURGERY  08/08/2023   CHOLECYSTECTOMY     INCISION AND DRAINAGE OF WOUND Left 10/28/2021   Procedure: KNEE ARTHROSCOPY WITH IRRIGATION AND DEBRIDEMENT;  Surgeon: Gerome Charleston, MD;  Location: [facility];  Service: Orthopedics;  Laterality: Left;  60 okay per OR   KNEE SURGERY Left    3 times   MANDIBLE SURGERY      Current Outpatient Medications  Medication Sig Dispense Refill   Ascorbic Acid (VITA-C PO) Take 2 tablets by mouth daily.     cholecalciferol  (CHOLECALCIFEROL ) 25 MCG tablet Take 1 tablet (1,000 Units total) by mouth daily. 30 tablet 0   cyanocobalamin  1000 MCG tablet Take 1 tablet (1,000 mcg total) by mouth daily. 30 tablet 0   diazepam  (VALIUM ) 10 MG tablet Take 10 mg by mouth every 6 (six) hours as needed for anxiety.     dicyclomine  (BENTYL ) 20 MG tablet Take 2 tablets (40 mg total) by mouth 4 (four) times daily as needed for spasms. 240 tablet 0   diphenoxylate -atropine  (LOMOTIL ) 2.5-0.025 MG tablet Take 1 tablet by mouth daily.     dronabinol  (MARINOL ) 5 MG capsule Take 1 capsule (  5 mg total) by mouth 2 (two) times daily. 60 capsule 0   drospirenone -ethinyl estradiol  (YAZ) 3-0.02 MG tablet Take 1 tablet by mouth daily with lunch.     hydrOXYzine  (ATARAX ) 50 MG tablet Take 50 mg by mouth every 6 (six) hours.     lurasidone  (LATUDA ) 20 MG TABS tablet Take 1 tablet by mouth daily. Takes with the 80mg      lurasidone  (LATUDA ) 80 MG TABS tablet Take 80 mg by mouth daily with breakfast. Takes with a 20mg  tablet     ondansetron  (ZOFRAN -ODT) 8 MG disintegrating tablet Take 1 tablet (8 mg total) by mouth every 8 (eight) hours as needed for nausea (DISSOLVE ORALLY). 60 tablet 0   pantoprazole  (PROTONIX ) 40 MG tablet Take 2 tablets  (80 mg total) by mouth 2 (two) times daily. 360 tablet 3   phentermine (ADIPEX-P) 37.5 MG tablet Take 37.5 mg by mouth daily before breakfast.     propranolol  (INDERAL ) 10 MG tablet Take 1 tablet (10 mg total) by mouth 3 (three) times daily. 90 tablet 0   rizatriptan  (MAXALT -MLT) 10 MG disintegrating tablet Take 10 mg by mouth as needed for migraine. May repeat in 2 hours if needed     rosuvastatin  (CRESTOR ) 10 MG tablet Take 1 tablet (10 mg total) by mouth at bedtime. 30 tablet 0   spironolactone  (ALDACTONE ) 100 MG tablet Take 1 tablet (100 mg total) by mouth daily at 12 noon. 30 tablet 0   Turmeric (QC TUMERIC COMPLEX PO) Take by mouth.     zolpidem  (AMBIEN  CR) 12.5 MG CR tablet Take 1 tablet (12.5 mg total) by mouth at bedtime. 30 tablet 0   No current facility-administered medications for this visit.    Allergies as of 01/13/2024 - Review Complete 01/13/2024  Allergen Reaction Noted   Codeine Other (See Comments) 10/28/2021   Fioricet-codeine [butalbital-apap-caff-cod] Other (See Comments) 10/28/2021   Isoniazid Shortness Of Breath 05/10/2013   Ketorolac  tromethamine  Other (See Comments) 10/28/2021   Rifampin Shortness Of Breath and Hives 05/10/2013   Tape Other (See Comments) 10/28/2021   Clindamycin Diarrhea and Other (See Comments) 03/18/2019   Ketoprofen Other (See Comments) 05/10/2013   Nsaids Other (See Comments) 10/01/2015   Doxycycline  Rash 10/16/2022    Family History  Problem Relation Age of Onset   Macular degeneration Mother    Hypertension Father    Cancer Maternal Grandmother    Cancer Paternal Grandfather    Heart attack Paternal Grandfather    Allergic rhinitis Neg Hx    Angioedema Neg Hx    Asthma Neg Hx    Atopy Neg Hx    Eczema Neg Hx    Urticaria Neg Hx    Immunodeficiency Neg Hx     Social History   Socioeconomic History   Marital status: Single    Spouse name: Not on file   Number of children: Not on file   Years of education: Not on file    Highest education level: Not on file  Occupational History   Occupation: disabled  Tobacco Use   Smoking status: Former    Types: Cigarettes   Smokeless tobacco: Never   Tobacco comments:    Quit 2010  Vaping Use   Vaping status: Never Used  Substance and Sexual Activity   Alcohol use: Yes    Comment: social   Drug use: Not Currently    Types: Marijuana    Comment: since 2010 occ   Sexual activity: Not on file  Other Topics Concern  Not on file  Social History Narrative   Caffiene 2 drinks per day   Lives home alone.  Education college   No kids.   Social Drivers of Corporate Investment Banker Strain: Not on file  Food Insecurity: Not on file  Transportation Needs: Not on file  Physical Activity: Not on file  Stress: Not on file  Social Connections: Not on file  Intimate Partner Violence: Not on file    Review of Systems:    Constitutional: No weight loss, fever, chills, weakness or fatigue HEENT: Eyes: No change in vision               Ears, Nose, Throat:  No change in hearing or congestion Skin: No rash or itching Cardiovascular: No chest pain, chest pressure or palpitations   Respiratory: No SOB or cough Gastrointestinal: See HPI and otherwise negative Genitourinary: No dysuria or change in urinary frequency Neurological: No headache, dizziness or syncope Musculoskeletal: No new muscle or joint pain Hematologic: No bleeding or bruising Psychiatric: No history of depression or anxiety    Physical Exam:  Vital signs: BP (!) 132/94   Pulse 91   Ht 5' 3 (1.6 m)   Wt 222 lb 8 oz (100.9 kg)   BMI 39.41 kg/m   Constitutional: NAD, alert and cooperative Head:  Normocephalic and atraumatic. Eyes:   PEERL, EOMI. No icterus. Conjunctiva pink. Respiratory: Respirations even and unlabored. Lungs clear to auscultation bilaterally.   No wheezes, crackles, or rhonchi.  Cardiovascular:  Regular rate and rhythm. No peripheral edema, cyanosis or pallor.   Gastrointestinal:  Soft, nondistended, nontender. No rebound or guarding. Normal bowel sounds. No appreciable masses or hepatomegaly. Rectal:  Declines Msk:  Symmetrical without gross deformities. Without edema, no deformity or joint abnormality.  Neurologic:  Alert and  oriented x4;  grossly normal neurologically.  Skin:   Dry and intact without significant lesions or rashes. Psychiatric: Oriented to person, place and time. Demonstrates good judgement and reason without abnormal affect or behaviors.  RELEVANT LABS AND IMAGING: CBC    Component Value Date/Time   WBC 7.4 03/10/2022 1708   RBC 4.18 03/10/2022 1708   HGB 12.4 03/10/2022 1708   HGB 12.2 07/18/2014 1037   HCT 35.4 (L) 03/10/2022 1708   HCT 36.8 07/18/2014 1037   PLT 280 03/10/2022 1708   PLT 262 07/18/2014 1037   MCV 84.7 03/10/2022 1708   MCV 93 07/18/2014 1037   MCH 29.7 03/10/2022 1708   MCHC 35.0 03/10/2022 1708   RDW 13.7 03/10/2022 1708   RDW 13.1 07/18/2014 1037   LYMPHSABS 3.5 03/10/2022 1708   MONOABS 0.3 03/10/2022 1708   EOSABS 0.0 03/10/2022 1708   BASOSABS 0.0 03/10/2022 1708    CMP     Component Value Date/Time   NA 134 (L) 03/10/2022 1708   NA 139 07/18/2014 1037   K 4.2 03/10/2022 1708   K 4.1 07/18/2014 1037   CL 103 03/10/2022 1708   CL 106 07/18/2014 1037   CO2 23 03/10/2022 1708   CO2 24 07/18/2014 1037   GLUCOSE 98 03/10/2022 1708   GLUCOSE 100 (H) 07/18/2014 1037   BUN 17 03/10/2022 1708   BUN 11 07/18/2014 1037   CREATININE 0.79 03/10/2022 1708   CREATININE 0.79 07/18/2014 1037   CALCIUM  9.9 03/10/2022 1708   CALCIUM  8.4 (L) 07/18/2014 1037   PROT 6.6 03/10/2022 1708   ALBUMIN 3.8 03/10/2022 1708   AST 9 (L) 03/10/2022 1708   ALT 11  03/10/2022 1708   ALKPHOS 47 03/10/2022 1708   BILITOT 0.4 03/10/2022 1708   GFRNONAA >60 03/10/2022 1708   GFRNONAA >60 07/18/2014 1037   GFRAA >60 11/28/2019 1420   GFRAA >60 07/18/2014 1037     Assessment/Plan:   Chronic GERD LUQ  pain Seen 11/09/2023 by Dr. Abran on pantoprazole  80 Mg twice daily to control symptoms as she is a rapid metabolizer determined by presence of CYP2D6 gene. Having LUQ burning pain with eating. Improvement with pepto and zofran  but having constipation. No NSAIDs. -- famotidine  40mg  daily -- educated patient on lifestyle modifications and provided patient education handouts -- CBC, CMP -- H pylori diatherix stool study -- if worsening symptoms before her upcoming trip, can add in carafate but advised of constipation side effects -- follow up 12 weeks. If no improvement, consider EGD  Constipation Currently having constipation and using colace and miralax . Had samples of trulance with improvement. -- continue to use miralax  and adjust dose based on response.  IBS-D Microscopic colitis diagnosed at [facility] clinic in 2015 treated with budesonide . Resolved.  History of adenomatous colon polyp at [facility] 2017.  Most recent colonoscopy with Dr. Towana 2020 with normal random colon biopsies and without polyps.  Follow-up in 10 years recommended. - due for repeat 2030  Addendum, patient declined stool study    Nestor Blower, PA-C [facility] 01/13/2024, 10:55 AM  Cc: Ina Marcellus RAMAN, MD "

## 2024-01-13 ENCOUNTER — Other Ambulatory Visit (INDEPENDENT_AMBULATORY_CARE_PROVIDER_SITE_OTHER)

## 2024-01-13 ENCOUNTER — Encounter: Payer: Self-pay | Admitting: Gastroenterology

## 2024-01-13 ENCOUNTER — Ambulatory Visit: Admitting: Gastroenterology

## 2024-01-13 VITALS — BP 132/94 | HR 91 | Ht 63.0 in | Wt 222.5 lb

## 2024-01-13 DIAGNOSIS — R1012 Left upper quadrant pain: Secondary | ICD-10-CM | POA: Diagnosis not present

## 2024-01-13 DIAGNOSIS — Z860100 Personal history of colon polyps, unspecified: Secondary | ICD-10-CM

## 2024-01-13 DIAGNOSIS — K589 Irritable bowel syndrome without diarrhea: Secondary | ICD-10-CM

## 2024-01-13 DIAGNOSIS — K219 Gastro-esophageal reflux disease without esophagitis: Secondary | ICD-10-CM

## 2024-01-13 DIAGNOSIS — K580 Irritable bowel syndrome with diarrhea: Secondary | ICD-10-CM

## 2024-01-13 DIAGNOSIS — K5900 Constipation, unspecified: Secondary | ICD-10-CM

## 2024-01-13 DIAGNOSIS — Z860101 Personal history of adenomatous and serrated colon polyps: Secondary | ICD-10-CM

## 2024-01-13 LAB — COMPREHENSIVE METABOLIC PANEL WITH GFR
ALT: 12 U/L (ref 0–35)
AST: 12 U/L (ref 0–37)
Albumin: 4 g/dL (ref 3.5–5.2)
Alkaline Phosphatase: 60 U/L (ref 39–117)
BUN: 16 mg/dL (ref 6–23)
CO2: 24 meq/L (ref 19–32)
Calcium: 10 mg/dL (ref 8.4–10.5)
Chloride: 99 meq/L (ref 96–112)
Creatinine, Ser: 0.98 mg/dL (ref 0.40–1.20)
GFR: 69.84 mL/min (ref >60.00)
Glucose, Bld: 101 mg/dL — ABNORMAL HIGH (ref 70–99)
Potassium: 4.2 meq/L (ref 3.5–5.1)
Sodium: 131 meq/L — ABNORMAL LOW (ref 135–145)
Total Bilirubin: 0.3 mg/dL (ref 0.2–1.2)
Total Protein: 7 g/dL (ref 6.0–8.3)

## 2024-01-13 LAB — CBC WITH DIFFERENTIAL/PLATELET
Basophils Absolute: 0.1 10*3/uL (ref 0.0–0.1)
Basophils Relative: 1.3 % (ref 0.0–3.0)
Eosinophils Absolute: 0.2 10*3/uL (ref 0.0–0.7)
Eosinophils Relative: 3.1 % (ref 0.0–5.0)
HCT: 36.4 % (ref 36.0–46.0)
Hemoglobin: 12.2 g/dL (ref 12.0–15.0)
Lymphocytes Relative: 38 % (ref 12.0–46.0)
Lymphs Abs: 2.6 10*3/uL (ref 0.7–4.0)
MCHC: 33.4 g/dL (ref 30.0–36.0)
MCV: 80.5 fl (ref 78.0–100.0)
Monocytes Absolute: 0.4 10*3/uL (ref 0.1–1.0)
Monocytes Relative: 6 % (ref 3.0–12.0)
Neutro Abs: 3.5 10*3/uL (ref 1.4–7.7)
Neutrophils Relative %: 51.6 % (ref 43.0–77.0)
Platelets: 322 10*3/uL (ref 150.0–400.0)
RBC: 4.52 Mil/uL (ref 3.87–5.11)
RDW: 15.5 % (ref 11.5–15.5)
WBC: 6.8 10*3/uL (ref 4.0–10.5)

## 2024-01-13 MED ORDER — FAMOTIDINE 40 MG PO TABS: 40.0000 mg | ORAL_TABLET | Freq: Every day | ORAL | 3 refills | Status: DC

## 2024-01-13 NOTE — Progress Notes (Signed)
 Noted

## 2024-01-13 NOTE — Addendum Note (Signed)
 Addended byPatricio Boop on: 01/13/2024 12:21 PM   Modules accepted: Orders

## 2024-01-13 NOTE — Patient Instructions (Signed)
 Your provider has requested that you go to the basement level for lab work before leaving today. Press "B" on the elevator. The lab is located at the first door on the left as you exit the elevator.  Due to recent changes in healthcare laws, you may see the results of your imaging and laboratory studies on MyChart before your provider has had a chance to review them.  We understand that in some cases there may be results that are confusing or concerning to you. Not all laboratory results come back in the same time frame and the provider may be waiting for multiple results in order to interpret others.  Please give us  48 hours in order for your provider to thoroughly review all the results before contacting the office for clarification of your results.   We have sent the following medications to your pharmacy for you to pick up at your convenience: Famotidine   Follow-up in Oct 2025. Office will contact you to schedule at later time.   Thank you for choosing me and [facility].

## 2024-01-14 ENCOUNTER — Ambulatory Visit: Payer: Self-pay | Admitting: Gastroenterology

## 2024-01-15 ENCOUNTER — Telehealth: Payer: Self-pay | Admitting: Gastroenterology

## 2024-01-15 NOTE — Telephone Encounter (Signed)
 Patient has been informed of stool study results.

## 2024-01-15 NOTE — Telephone Encounter (Signed)
Negative h.pylori

## 2024-01-18 DIAGNOSIS — F409 Phobic anxiety disorder, unspecified: Secondary | ICD-10-CM | POA: Diagnosis not present

## 2024-01-18 DIAGNOSIS — R519 Headache, unspecified: Secondary | ICD-10-CM | POA: Diagnosis not present

## 2024-01-18 DIAGNOSIS — M5481 Occipital neuralgia: Secondary | ICD-10-CM | POA: Diagnosis not present

## 2024-01-18 DIAGNOSIS — S134XXD Sprain of ligaments of cervical spine, subsequent encounter: Secondary | ICD-10-CM | POA: Diagnosis not present

## 2024-01-19 DIAGNOSIS — M5481 Occipital neuralgia: Secondary | ICD-10-CM | POA: Diagnosis not present

## 2024-01-21 ENCOUNTER — Encounter: Payer: Self-pay | Admitting: Gastroenterology

## 2024-02-03 DIAGNOSIS — M542 Cervicalgia: Secondary | ICD-10-CM | POA: Diagnosis not present

## 2024-02-03 DIAGNOSIS — G4489 Other headache syndrome: Secondary | ICD-10-CM | POA: Diagnosis not present

## 2024-02-03 DIAGNOSIS — M25512 Pain in left shoulder: Secondary | ICD-10-CM | POA: Diagnosis not present

## 2024-02-03 DIAGNOSIS — M2560 Stiffness of unspecified joint, not elsewhere classified: Secondary | ICD-10-CM | POA: Diagnosis not present

## 2024-02-03 DIAGNOSIS — M5489 Other dorsalgia: Secondary | ICD-10-CM | POA: Diagnosis not present

## 2024-02-03 DIAGNOSIS — M25511 Pain in right shoulder: Secondary | ICD-10-CM | POA: Diagnosis not present

## 2024-02-03 DIAGNOSIS — R293 Abnormal posture: Secondary | ICD-10-CM | POA: Diagnosis not present

## 2024-02-03 DIAGNOSIS — M6281 Muscle weakness (generalized): Secondary | ICD-10-CM | POA: Diagnosis not present

## 2024-02-04 DIAGNOSIS — W19XXXA Unspecified fall, initial encounter: Secondary | ICD-10-CM

## 2024-02-04 DIAGNOSIS — S8002XA Contusion of left knee, initial encounter: Secondary | ICD-10-CM | POA: Diagnosis not present

## 2024-02-04 DIAGNOSIS — S81812A Laceration without foreign body, left lower leg, initial encounter: Secondary | ICD-10-CM | POA: Diagnosis not present

## 2024-02-04 DIAGNOSIS — M7989 Other specified soft tissue disorders: Secondary | ICD-10-CM | POA: Diagnosis not present

## 2024-02-04 DIAGNOSIS — S90415A Abrasion, left lesser toe(s), initial encounter: Secondary | ICD-10-CM | POA: Diagnosis not present

## 2024-02-04 DIAGNOSIS — I10 Essential (primary) hypertension: Secondary | ICD-10-CM | POA: Diagnosis not present

## 2024-02-04 HISTORY — DX: Unspecified fall, initial encounter: W19.XXXA

## 2024-02-07 DIAGNOSIS — M25562 Pain in left knee: Secondary | ICD-10-CM | POA: Diagnosis not present

## 2024-02-07 DIAGNOSIS — M545 Low back pain, unspecified: Secondary | ICD-10-CM | POA: Diagnosis not present

## 2024-02-07 DIAGNOSIS — G8929 Other chronic pain: Secondary | ICD-10-CM | POA: Diagnosis not present

## 2024-02-07 DIAGNOSIS — G43919 Migraine, unspecified, intractable, without status migrainosus: Secondary | ICD-10-CM | POA: Diagnosis not present

## 2024-02-07 DIAGNOSIS — M01X62 Direct infection of left knee in infectious and parasitic diseases classified elsewhere: Secondary | ICD-10-CM | POA: Diagnosis not present

## 2024-02-08 DIAGNOSIS — R062 Wheezing: Secondary | ICD-10-CM | POA: Diagnosis not present

## 2024-02-08 DIAGNOSIS — Z6841 Body Mass Index (BMI) 40.0 and over, adult: Secondary | ICD-10-CM | POA: Diagnosis not present

## 2024-02-08 DIAGNOSIS — F4310 Post-traumatic stress disorder, unspecified: Secondary | ICD-10-CM | POA: Diagnosis not present

## 2024-02-08 DIAGNOSIS — S81012S Laceration without foreign body, left knee, sequela: Secondary | ICD-10-CM | POA: Diagnosis not present

## 2024-02-08 DIAGNOSIS — L03116 Cellulitis of left lower limb: Secondary | ICD-10-CM | POA: Diagnosis not present

## 2024-02-09 DIAGNOSIS — M25562 Pain in left knee: Secondary | ICD-10-CM | POA: Diagnosis not present

## 2024-02-09 DIAGNOSIS — T8131XA Disruption of external operation (surgical) wound, not elsewhere classified, initial encounter: Secondary | ICD-10-CM | POA: Diagnosis not present

## 2024-02-10 DIAGNOSIS — S81012D Laceration without foreign body, left knee, subsequent encounter: Secondary | ICD-10-CM | POA: Diagnosis not present

## 2024-02-10 DIAGNOSIS — L97822 Non-pressure chronic ulcer of other part of left lower leg with fat layer exposed: Secondary | ICD-10-CM | POA: Diagnosis not present

## 2024-02-10 DIAGNOSIS — T8130XA Disruption of wound, unspecified, initial encounter: Secondary | ICD-10-CM | POA: Diagnosis not present

## 2024-02-10 DIAGNOSIS — S81002A Unspecified open wound, left knee, initial encounter: Secondary | ICD-10-CM | POA: Diagnosis not present

## 2024-02-17 DIAGNOSIS — F32A Depression, unspecified: Secondary | ICD-10-CM | POA: Diagnosis not present

## 2024-02-17 DIAGNOSIS — L97822 Non-pressure chronic ulcer of other part of left lower leg with fat layer exposed: Secondary | ICD-10-CM | POA: Diagnosis not present

## 2024-02-17 DIAGNOSIS — I10 Essential (primary) hypertension: Secondary | ICD-10-CM | POA: Diagnosis not present

## 2024-02-17 DIAGNOSIS — T8130XA Disruption of wound, unspecified, initial encounter: Secondary | ICD-10-CM | POA: Diagnosis not present

## 2024-02-17 DIAGNOSIS — E7800 Pure hypercholesterolemia, unspecified: Secondary | ICD-10-CM | POA: Diagnosis not present

## 2024-02-22 DIAGNOSIS — T8189XA Other complications of procedures, not elsewhere classified, initial encounter: Secondary | ICD-10-CM | POA: Diagnosis not present

## 2024-02-22 DIAGNOSIS — S81002A Unspecified open wound, left knee, initial encounter: Secondary | ICD-10-CM | POA: Diagnosis not present

## 2024-02-22 DIAGNOSIS — F4310 Post-traumatic stress disorder, unspecified: Secondary | ICD-10-CM | POA: Diagnosis not present

## 2024-02-23 DIAGNOSIS — M545 Low back pain, unspecified: Secondary | ICD-10-CM | POA: Diagnosis not present

## 2024-02-23 DIAGNOSIS — Z6841 Body Mass Index (BMI) 40.0 and over, adult: Secondary | ICD-10-CM | POA: Diagnosis not present

## 2024-02-23 DIAGNOSIS — R197 Diarrhea, unspecified: Secondary | ICD-10-CM | POA: Diagnosis not present

## 2024-02-24 DIAGNOSIS — L97822 Non-pressure chronic ulcer of other part of left lower leg with fat layer exposed: Secondary | ICD-10-CM | POA: Diagnosis not present

## 2024-02-24 DIAGNOSIS — L97823 Non-pressure chronic ulcer of other part of left lower leg with necrosis of muscle: Secondary | ICD-10-CM | POA: Diagnosis not present

## 2024-03-02 DIAGNOSIS — R062 Wheezing: Secondary | ICD-10-CM | POA: Diagnosis not present

## 2024-03-02 DIAGNOSIS — Z6841 Body Mass Index (BMI) 40.0 and over, adult: Secondary | ICD-10-CM | POA: Diagnosis not present

## 2024-03-02 DIAGNOSIS — E871 Hypo-osmolality and hyponatremia: Secondary | ICD-10-CM | POA: Diagnosis not present

## 2024-03-02 DIAGNOSIS — T63301A Toxic effect of unspecified spider venom, accidental (unintentional), initial encounter: Secondary | ICD-10-CM | POA: Diagnosis not present

## 2024-03-02 DIAGNOSIS — F410 Panic disorder [episodic paroxysmal anxiety] without agoraphobia: Secondary | ICD-10-CM | POA: Diagnosis not present

## 2024-03-07 DIAGNOSIS — F4310 Post-traumatic stress disorder, unspecified: Secondary | ICD-10-CM | POA: Diagnosis not present

## 2024-03-07 DIAGNOSIS — S81002A Unspecified open wound, left knee, initial encounter: Secondary | ICD-10-CM | POA: Diagnosis not present

## 2024-03-07 DIAGNOSIS — T8189XA Other complications of procedures, not elsewhere classified, initial encounter: Secondary | ICD-10-CM | POA: Diagnosis not present

## 2024-03-08 DIAGNOSIS — M47816 Spondylosis without myelopathy or radiculopathy, lumbar region: Secondary | ICD-10-CM | POA: Diagnosis not present

## 2024-03-09 DIAGNOSIS — L97822 Non-pressure chronic ulcer of other part of left lower leg with fat layer exposed: Secondary | ICD-10-CM | POA: Diagnosis not present

## 2024-03-15 DIAGNOSIS — M47816 Spondylosis without myelopathy or radiculopathy, lumbar region: Secondary | ICD-10-CM | POA: Diagnosis not present

## 2024-03-22 DIAGNOSIS — G8929 Other chronic pain: Secondary | ICD-10-CM | POA: Diagnosis not present

## 2024-03-22 DIAGNOSIS — Z5181 Encounter for therapeutic drug level monitoring: Secondary | ICD-10-CM | POA: Diagnosis not present

## 2024-03-22 DIAGNOSIS — M5459 Other low back pain: Secondary | ICD-10-CM | POA: Diagnosis not present

## 2024-03-22 DIAGNOSIS — M47816 Spondylosis without myelopathy or radiculopathy, lumbar region: Secondary | ICD-10-CM | POA: Diagnosis not present

## 2024-03-23 DIAGNOSIS — R609 Edema, unspecified: Secondary | ICD-10-CM | POA: Diagnosis not present

## 2024-03-23 DIAGNOSIS — Z6841 Body Mass Index (BMI) 40.0 and over, adult: Secondary | ICD-10-CM | POA: Diagnosis not present

## 2024-03-23 DIAGNOSIS — S81002D Unspecified open wound, left knee, subsequent encounter: Secondary | ICD-10-CM | POA: Diagnosis not present

## 2024-03-23 DIAGNOSIS — L97822 Non-pressure chronic ulcer of other part of left lower leg with fat layer exposed: Secondary | ICD-10-CM | POA: Diagnosis not present

## 2024-03-28 DIAGNOSIS — F4310 Post-traumatic stress disorder, unspecified: Secondary | ICD-10-CM | POA: Diagnosis not present

## 2024-03-29 DIAGNOSIS — Z6841 Body Mass Index (BMI) 40.0 and over, adult: Secondary | ICD-10-CM | POA: Diagnosis not present

## 2024-03-29 DIAGNOSIS — G8929 Other chronic pain: Secondary | ICD-10-CM | POA: Diagnosis not present

## 2024-03-29 DIAGNOSIS — M2242 Chondromalacia patellae, left knee: Secondary | ICD-10-CM | POA: Diagnosis not present

## 2024-03-29 DIAGNOSIS — M47816 Spondylosis without myelopathy or radiculopathy, lumbar region: Secondary | ICD-10-CM | POA: Diagnosis not present

## 2024-03-29 DIAGNOSIS — M25562 Pain in left knee: Secondary | ICD-10-CM | POA: Diagnosis not present

## 2024-03-30 DIAGNOSIS — Z6841 Body Mass Index (BMI) 40.0 and over, adult: Secondary | ICD-10-CM | POA: Diagnosis not present

## 2024-03-30 DIAGNOSIS — R609 Edema, unspecified: Secondary | ICD-10-CM | POA: Diagnosis not present

## 2024-04-04 DIAGNOSIS — F419 Anxiety disorder, unspecified: Secondary | ICD-10-CM | POA: Diagnosis not present

## 2024-04-04 DIAGNOSIS — F314 Bipolar disorder, current episode depressed, severe, without psychotic features: Secondary | ICD-10-CM | POA: Diagnosis not present

## 2024-04-06 DIAGNOSIS — Z124 Encounter for screening for malignant neoplasm of cervix: Secondary | ICD-10-CM | POA: Diagnosis not present

## 2024-04-06 DIAGNOSIS — Z6841 Body Mass Index (BMI) 40.0 and over, adult: Secondary | ICD-10-CM | POA: Diagnosis not present

## 2024-04-11 DIAGNOSIS — F4310 Post-traumatic stress disorder, unspecified: Secondary | ICD-10-CM | POA: Diagnosis not present

## 2024-04-13 DIAGNOSIS — L97822 Non-pressure chronic ulcer of other part of left lower leg with fat layer exposed: Secondary | ICD-10-CM | POA: Diagnosis not present

## 2024-04-20 ENCOUNTER — Encounter: Payer: Self-pay | Admitting: Neurology

## 2024-04-20 ENCOUNTER — Ambulatory Visit: Admitting: Neurology

## 2024-04-20 VITALS — BP 125/83 | HR 83

## 2024-04-20 DIAGNOSIS — M25562 Pain in left knee: Secondary | ICD-10-CM | POA: Diagnosis not present

## 2024-04-20 DIAGNOSIS — G4486 Cervicogenic headache: Secondary | ICD-10-CM | POA: Diagnosis not present

## 2024-04-20 DIAGNOSIS — G8929 Other chronic pain: Secondary | ICD-10-CM

## 2024-04-20 DIAGNOSIS — M545 Low back pain, unspecified: Secondary | ICD-10-CM | POA: Diagnosis not present

## 2024-04-20 DIAGNOSIS — G43909 Migraine, unspecified, not intractable, without status migrainosus: Secondary | ICD-10-CM

## 2024-04-20 NOTE — Progress Notes (Signed)
 Subjective:    Patient ID: Kayla Holden is a 43 y.o. female.  HPI    True Mar, MD, PhD [facility] [address], [address] [address] [place], [place] [address]  Dear Kayla Holden,  I saw your patient, Kayla Holden, upon your kind request in my neurologic clinic today for evaluation of her recurrent headaches.  The patient is unaccompanied today.  As you know, Kayla Holden is a 43 year old female with an underlying medical history of coronary artery disease, history of Chiari malformation, depression, anxiety, bipolar disorder, reflux disease, hypertension, hyperlipidemia, irritable bowel syndrome, B12 deficiency, history of colitis, and obesity, who reports a longstanding history of migraine headaches.  She saw Dr. Oneita a few years ago.  She no longer sees a specialist for her migraines.  Migraines are about twice a month.  She has other headaches especially coming from the back of her head and radiating forward, typically to the right side.  She does have pain behind the right eye sometimes.  She denies any blurry vision but also has not had a formal eye examination in years, does not remember when she actually saw an optometrist in the past.  She had a recent occipital injection under Dr. Bonner about 6 weeks ago.  She has had multiple left knee surgeries and right foot surgery and had lumbar discectomy at L4-5 under Dr. Burnetta in December 2024.  She had whiplash injury in March she states.  She is on Maxalt  as needed and propranolol  3 times a day 10 mg strength per PCP.  She tries to hydrate well with water and also drinks electrolyte water and eats a banana every day for electrolytes.  She had occipital injection about 6 weeks ago and feels that it has been helpful.  She has been followed by wound care for her left knee wound since June 2025.  She has low back pain and may need surgery again to the lower back. She drinks alcohol occasionally, less than once a month.  She  drinks a serving of soda per day, small bottle. I reviewed your office note from 01/18/2024.  I had evaluated her for sleep apnea concern in 2024.  A baseline sleep study through our sleep lab on 12/09/2022 was negative for any significant obstructive sleep apnea.  She has seen multiple neurologists for her headaches including for migraines.  She has [facility] by Dr. Oneita.  Prior to that she has seen different providers at [facility] and has also seen neurology at [facility].  She saw Dr. Arthea Farrow in 2017, at which time she was on zonisamide and was advised to taper off of it.  She was on Remeron at the time and Lexapro and had tried Seroquel for sleep.  She had received trigger point injections.  She was on Zofran  and Phenergan  as needed, she was on Inderal  at the time and was on Ambien  and clonazepam.  She cannot take nonsteroidal anti-inflammatory medications due to her history of colitis.  She had tried Fiorinal in the past.  She had a brain MRI with and without contrast on 07/26/2013 and I reviewed the results:  IMPRESSION: 1. Unremarkable MRI examination of the brain and orbits.   2. Normal appearance of the trigeminal nerves bilaterally.   She had an MR angiogram of the head without contrast on 05/15/2015 and I reviewed the results:  IMPRESSION: Unremarkable head MRA.   She had a brain MRI with and without contrast  on 05/26/2013 and I reviewed the results:   IMPRESSION: 1. Unremarkable pre-and postcontrast enhanced brain MRI. 2. Mild sinus disease.   She had visual evoked potentials through [facility] on 05/24/2015 and I reviewed the results: Impression: Normal pattern reversal visual evoked potentials bilaterally.  She saw Dr. Jannett Fairly, neurology at [facility] on 05/09/2015.  She saw Dr. Ozell Hock with [facility] neurology on 09/02/2013.  She had a brain MRI without contrast ordered by Dr. Victory Gunnels on 05/11/2015 and I  reviewed the results:  IMPRESSION: Stable and normal for age noncontrast MRI appearance of the brain.   Previously:   10/20/22: 43 year old female with an underlying medical history of endometriosis, fatty liver, anemia, bipolar disorder, with treatment with ECT, migraine headaches, Chiari malformation, hypertension, and overweight state, who reports chronic difficulty initiating and maintaining sleep and excessive daytime sleepiness.  Her Epworth sleepiness score is 15 out of 24, fatigue severity score is 55 out of 63.  She admits that she has had sleepiness at the wheel and had to pull over or change drivers. She does not believe she snores. I reviewed your office note from 08/13/2022.  She was advised to seek a sleep study by her mental health provider.  She had insomnia while on lithium , she is no longer on lithium .  She had a visit with [facility] on 08/04/2022, at which time her lithium  was discontinued.  She was advised to increase her propranolol  and continue with gabapentin .  I also reviewed an office visit note from [facility] from 06/23/2022, she saw Saddie Ha, PA at the time.  I also reviewed a visit note from Saddie Ha, [place] on 05/19/2022, at which time her Lamictal was discontinued, she was advised to increase her gabapentin  and she was maintained on Latuda .  I reviewed a visit note with the [facility] from 04/15/2022 as well as 03/19/2022 and 03/10/2022.  She reports occasional morning headaches.  She takes Tylenol  for these.  She also has a history of migraine.  She has been on Ambien  for years, previously on 5 and then 10 mg, currently on long-acting 12.5 mg each night.  She falls asleep fairly quickly, goes to bed around 9:30 PM or 10 PM but has difficulty maintaining sleep, may be awake somewhere between 1 and 3 AM and cannot go back to sleep.  She has no nightly nocturia, she lives alone, her fianc has not complained about her snoring.  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  She is on several potentially sedating medications including Latuda  which she takes at lunchtime, she also takes Valium  5 mg 3 times daily.  She is not aware of any family history of sleep apnea.  She tries not to take any naps.  She drinks caffeine in the form of soda, 2 8 ounce bottles per day, alcohol occasionally, typically on special occasions only.  She quit smoking in 2010.  She is scheduled for left knee surgery later this month.    Her Past Medical History Is Significant For: Past Medical History:  Diagnosis Date   Anemia    Anxiety    Anxiety    Bipolar 1 disorder (HCC)    CAD (coronary artery disease)    Chiari malformation    Colitis    Colon polyps    Complication of anesthesia    CYP2D6 extensive metabolizer    Depression  Endometriosis    Fall 02/04/2024   wound vac, skin ulcer of L knee with fat layer exposed. wound dehiscence.   Fatty liver    GERD (gastroesophageal reflux disease)    Hyperlipidemia    Hypertension    IBS (irritable bowel syndrome)    MDD (major depressive disorder)    Microscopic colitis    Migraine headache    Panic disorder with agoraphobia    PONV (postoperative nausea and vomiting)    Vitamin B 12 deficiency     Her Past Surgical History Is Significant For: Past Surgical History:  Procedure Laterality Date   APPENDECTOMY     BACK SURGERY  08/08/2023   CHOLECYSTECTOMY     INCISION AND DRAINAGE OF WOUND Left 10/28/2021   Procedure: KNEE ARTHROSCOPY WITH IRRIGATION AND DEBRIDEMENT;  Surgeon: Gerome Charleston, MD;  Location: [facility];  Service: Orthopedics;  Laterality: Left;  60 okay per OR   KNEE SURGERY Left    total of 6 times   MANDIBLE SURGERY     right foot arthrocentesis      Her Family History Is Significant For: Family History  Problem Relation Age of Onset   Macular degeneration Mother    Hypertension Father    Cancer Maternal Grandmother    Cancer  Paternal Grandfather    Heart attack Paternal Grandfather    Allergic rhinitis Neg Hx    Angioedema Neg Hx    Asthma Neg Hx    Atopy Neg Hx    Eczema Neg Hx    Urticaria Neg Hx    Immunodeficiency Neg Hx    Migraines Neg Hx     Her Social History Is Significant For: Social History   Socioeconomic History   Marital status: Single    Spouse name: Not on file   Number of children: Not on file   Years of education: Not on file   Highest education level: Not on file  Occupational History   Occupation: disabled  Tobacco Use   Smoking status: Former    Types: Cigarettes   Smokeless tobacco: Never   Tobacco comments:    Quit 2010  Vaping Use   Vaping status: Never Used  Substance and Sexual Activity   Alcohol use: Yes    Comment: social   Drug use: Not Currently    Types: Marijuana    Comment: since 2010 occ   Sexual activity: Not on file  Other Topics Concern   Not on file  Social History Narrative   Caffiene 2 drinks per day   Lives home alone.  Education college   No kids.   Right handed   Social Drivers of Health   Financial Resource Strain: Not on file  Food Insecurity: Low Risk  (02/10/2024)   Received from [facility]   Hunger Vital Sign    Within the past 12 months, you worried that your food would run out before you got money to buy more: Never true    Within the past 12 months, the food you bought just didn't last and you didn't have money to get more. : Never true  Transportation Needs: No Transportation Needs (02/10/2024)   Received from [facility]    In the past 12 months, has lack of reliable transportation kept you from medical appointments, meetings, work or from getting things needed for daily living? : No  Physical Activity: Not on file  Stress: Not on file  Social Connections: Not on file  Her Allergies Are:  Allergies  Allergen Reactions   Codeine Other (See Comments)    Patient has CYP2D6, so NO CODEINE    Fioricet-Codeine [Butalbital-Apap-Caff-Cod] Other (See Comments)    Patient has CYP2D6 and cannot tolerate codeine, palpitations, rapid heart rate   Isoniazid Shortness Of Breath   Ketorolac  Tromethamine  Other (See Comments)    Abdominal Pain if given PO. It has to be given IV   Rifampin Shortness Of Breath and Hives   Tape Other (See Comments)    Paper tape causes blisters    Clindamycin Diarrhea and Other (See Comments)    Abdominal pain, also   Ketoprofen Other (See Comments)    Abdominal pain and cannot take due to history of colitis   Nsaids Other (See Comments)    History of colitis  :   Her Current Medications Are:  Outpatient Encounter Medications as of 04/20/2024  Medication Sig   Ascorbic Acid (VITA-C PO) Take 2 tablets by mouth daily.   chlordiazePOXIDE (LIBRIUM) 25 MG capsule Take 25 mg by mouth 3 (three) times daily.   cholecalciferol  (CHOLECALCIFEROL ) 25 MCG tablet Take 1 tablet (1,000 Units total) by mouth daily.   cyanocobalamin  1000 MCG tablet Take 1 tablet (1,000 mcg total) by mouth daily.   dicyclomine  (BENTYL ) 20 MG tablet Take 2 tablets (40 mg total) by mouth 4 (four) times daily as needed for spasms.   diphenoxylate -atropine  (LOMOTIL ) 2.5-0.025 MG tablet Take 1 tablet by mouth daily.   dronabinol  (MARINOL ) 5 MG capsule Take 1 capsule (5 mg total) by mouth 2 (two) times daily.   drospirenone -ethinyl estradiol  (YAZ) 3-0.02 MG tablet Take 1 tablet by mouth daily with lunch.   famotidine  (PEPCID ) 40 MG tablet Take 1 tablet (40 mg total) by mouth at bedtime.   hydrOXYzine  (ATARAX ) 50 MG tablet Take 50 mg by mouth every 6 (six) hours.   lurasidone  (LATUDA ) 20 MG TABS tablet Take 20 mg by mouth daily. Takes with 80 mg for total of 100 mg daily.   lurasidone  (LATUDA ) 80 MG TABS tablet Take 80 mg by mouth daily. Takes with 20 mg to total 100 mg daily.   ondansetron  (ZOFRAN -ODT) 8 MG disintegrating tablet Take 1 tablet (8 mg total) by mouth every 8 (eight) hours as needed  for nausea (DISSOLVE ORALLY).   pantoprazole  (PROTONIX ) 40 MG tablet Take 2 tablets (80 mg total) by mouth 2 (two) times daily.   propranolol  (INDERAL ) 10 MG tablet Take 1 tablet (10 mg total) by mouth 3 (three) times daily.   rizatriptan  (MAXALT -MLT) 10 MG disintegrating tablet Take 10 mg by mouth as needed for migraine. May repeat in 2 hours if needed   rosuvastatin  (CRESTOR ) 10 MG tablet Take 1 tablet (10 mg total) by mouth at bedtime.   spironolactone  (ALDACTONE ) 100 MG tablet Take 1 tablet (100 mg total) by mouth daily at 12 noon.   torsemide (DEMADEX) 20 MG tablet Take 20 mg by mouth 2 (two) times daily.   Turmeric (QC TUMERIC COMPLEX PO) Take by mouth.   zolpidem  (AMBIEN  CR) 12.5 MG CR tablet Take 1 tablet (12.5 mg total) by mouth at bedtime.   lurasidone  (LATUDA ) 80 MG TABS tablet Take 80 mg by mouth daily with breakfast. Takes with a 20mg  tablet   [DISCONTINUED] diazepam  (VALIUM ) 10 MG tablet Take 10 mg by mouth every 6 (six) hours as needed for anxiety.   [DISCONTINUED] lurasidone  (LATUDA ) 20 MG TABS tablet Take 1 tablet by mouth daily. Takes with the 80mg    [  DISCONTINUED] phentermine (ADIPEX-P) 37.5 MG tablet Take 37.5 mg by mouth daily before breakfast.   No facility-administered encounter medications on file as of 04/20/2024.  :   Review of Systems:  Out of a complete 14 point review of systems, all are reviewed and negative with the exception of these symptoms as listed below:  Review of Systems  Neurological:        Patient is here alone for headache referral. They have been present off and on for awhile but in March she had a whiplash injury.     Objective:  Neurological Exam  Physical Exam Physical Examination:   Vitals:   04/20/24 1255  BP: 125/83  Pulse: 83    General Examination: The patient is a very pleasant 43 y.o. female in no acute distress. She appears well-developed and well-nourished and well groomed.   HEENT: Normocephalic, atraumatic, pupils are  equal, round and reactive to light, extraocular tracking is good without limitation to gaze excursion or nystagmus noted.  No photophobia, funduscopic exam benign.  Hearing is grossly intact. Face is symmetric with mild facial masking. Speech is slow, but clear with no dysarthria noted. There is no hypophonia. There is no lip, neck/head, jaw or voice tremor. Neck is supple with full range of passive and active motion. There are no carotid bruits on auscultation. Oropharynx exam reveals: mild to moderate mouth dryness, good dental hygiene and mild airway crowding, due to smaller airway entry. Tongue protrudes centrally and palate elevates symmetrically.    Chest: Clear to auscultation without wheezing, rhonchi or crackles noted.   Heart: S1+S2+0, regular and normal without murmurs, rubs or gallops noted.    Abdomen: Soft, non-tender and non-distended.   Extremities: There is no pitting edema in the distal lower extremities bilaterally.    Skin: Warm and dry without trophic changes noted.    Musculoskeletal: exam reveals left knee bandaged, scar right foot.  Reports low back pain bilaterally.   Neurologically:  Mental status: The patient is awake, alert and oriented in all 4 spheres. Her immediate and remote memory, attention, language skills and fund of knowledge are appropriate. There is no evidence of aphasia, agnosia, apraxia or anomia. Speech is clear with normal prosody and enunciation. Thought process is linear. Mood is constricted and affect is flat.  Cranial nerves II - XII are as described above under HEENT exam.  Motor exam: Normal bulk, strength and tone is noted but limited exam of the left knee. There is no obvious action or resting tremor.  Fine motor skills and coordination: grossly intact.  Cerebellar testing: No dysmetria or intention tremor. There is no truncal or gait ataxia.  Sensory exam: intact to light touch, and temperature in the upper and lower extremities.  Gait,  station and balance: She stands somewhat slowly, she walks with a mild limp.     Assessment and Plan:  In summary, Kayla Holden is a 43 year old female with an underlying medical history of coronary artery disease, history of Chiari malformation, depression, anxiety, bipolar disorder, reflux disease, hypertension, hyperlipidemia, irritable bowel syndrome, B12 deficiency, history of colitis, and obesity, who presents for evaluation of her recurrent headaches including migraines for years.  Headaches currently are probably a combination of migraine headaches and also cervicogenic headaches.  She has had increase in stress and chronic pain which may exacerbate her headaches as well.  We talked at length today, we talked about alleviating factors and exacerbating factors for migraines and headaches in general.   She has  noted some benefit of her headaches since her occipital injections, she may need further injections and also may need lumbar spine surgery again.  She is under wound care for her knee open wound and may need another wound VAC.  At this juncture, she is agreeable to maintaining her current medication regimen including as needed Maxalt  and low-dose propranolol , she is encouraged to talk to her PCP about potentially increasing her propranolol  to 20 mg 3 times daily.  She may be a good candidate for one of the injectable medication such as Aimovig, Ajovy or Emgality.  We talked about these options, she has tried Botox in the past but did not find it beneficial.  She is advised to stay well-hydrated and well rested and establish with an optometrist or ophthalmologist of her choosing for a full, dilated eye examination as even strain on the eyes can exacerbate headaches.  For now, she is advised to follow-up with her PCP and other providers and we will see her in about 6 to 8 months in our clinic with one of our nurse practitioners and may consider adding an injectable medication at the time.  I  answered all her questions today and she was in agreement with our approach. I spent 45 minutes in total face-to-face time and in reviewing records during pre-charting, more than 50% of which was spent in counseling and coordination of care, reviewing test results, reviewing medications and treatment regimen and/or in discussing or reviewing the diagnosis of recurrent headaches, the prognosis and treatment options. Pertinent laboratory and imaging test results that were available during this visit with the patient were reviewed by me and considered in my medical decision making (see chart for details).

## 2024-04-20 NOTE — Patient Instructions (Addendum)
 You have tried a failed multiple abortive and preventative treatments for your migraines. You may still benefit from the occipital injections and overall pain reduction (ie left knee and lower back) and stress reduction (for example from having a chronic infection).  You may be a good candidate for one of the newer injectable medications, such as Aimovig, Ajovy and Emgality.  For now, take your current medications as prescribed by your PCP and other specialists. You can consider increasing your propranolol  to 20 mg 3 times a day through your PCP.  Please see any optometrist or ophthalmologist of your choosing for a full, dilated eye exam.

## 2024-04-25 DIAGNOSIS — G8929 Other chronic pain: Secondary | ICD-10-CM | POA: Diagnosis not present

## 2024-04-25 DIAGNOSIS — Z6841 Body Mass Index (BMI) 40.0 and over, adult: Secondary | ICD-10-CM | POA: Diagnosis not present

## 2024-04-25 DIAGNOSIS — M25562 Pain in left knee: Secondary | ICD-10-CM | POA: Diagnosis not present

## 2024-04-25 DIAGNOSIS — M2242 Chondromalacia patellae, left knee: Secondary | ICD-10-CM | POA: Diagnosis not present

## 2024-04-27 DIAGNOSIS — S81002D Unspecified open wound, left knee, subsequent encounter: Secondary | ICD-10-CM | POA: Diagnosis not present

## 2024-04-29 DIAGNOSIS — M545 Low back pain, unspecified: Secondary | ICD-10-CM | POA: Diagnosis not present

## 2024-04-29 DIAGNOSIS — M5136 Other intervertebral disc degeneration, lumbar region with discogenic back pain only: Secondary | ICD-10-CM | POA: Diagnosis not present

## 2024-05-09 DIAGNOSIS — F4310 Post-traumatic stress disorder, unspecified: Secondary | ICD-10-CM | POA: Diagnosis not present

## 2024-05-10 DIAGNOSIS — M961 Postlaminectomy syndrome, not elsewhere classified: Secondary | ICD-10-CM | POA: Diagnosis not present

## 2024-05-10 DIAGNOSIS — M51369 Other intervertebral disc degeneration, lumbar region without mention of lumbar back pain or lower extremity pain: Secondary | ICD-10-CM | POA: Diagnosis not present

## 2024-05-10 DIAGNOSIS — G8918 Other acute postprocedural pain: Secondary | ICD-10-CM | POA: Diagnosis not present

## 2024-05-11 DIAGNOSIS — L97822 Non-pressure chronic ulcer of other part of left lower leg with fat layer exposed: Secondary | ICD-10-CM | POA: Diagnosis not present

## 2024-05-12 ENCOUNTER — Other Ambulatory Visit: Payer: Self-pay | Admitting: Orthopedic Surgery

## 2024-05-12 DIAGNOSIS — M549 Dorsalgia, unspecified: Secondary | ICD-10-CM

## 2024-05-16 DIAGNOSIS — F419 Anxiety disorder, unspecified: Secondary | ICD-10-CM | POA: Diagnosis not present

## 2024-05-16 DIAGNOSIS — F314 Bipolar disorder, current episode depressed, severe, without psychotic features: Secondary | ICD-10-CM | POA: Diagnosis not present

## 2024-05-23 DIAGNOSIS — F4310 Post-traumatic stress disorder, unspecified: Secondary | ICD-10-CM | POA: Diagnosis not present

## 2024-05-24 DIAGNOSIS — F3130 Bipolar disorder, current episode depressed, mild or moderate severity, unspecified: Secondary | ICD-10-CM | POA: Diagnosis not present

## 2024-05-24 DIAGNOSIS — Z23 Encounter for immunization: Secondary | ICD-10-CM | POA: Diagnosis not present

## 2024-05-24 DIAGNOSIS — E8889 Other specified metabolic disorders: Secondary | ICD-10-CM | POA: Diagnosis not present

## 2024-05-24 DIAGNOSIS — R609 Edema, unspecified: Secondary | ICD-10-CM | POA: Diagnosis not present

## 2024-05-24 DIAGNOSIS — D649 Anemia, unspecified: Secondary | ICD-10-CM | POA: Diagnosis not present

## 2024-05-24 DIAGNOSIS — F410 Panic disorder [episodic paroxysmal anxiety] without agoraphobia: Secondary | ICD-10-CM | POA: Diagnosis not present

## 2024-05-24 DIAGNOSIS — R062 Wheezing: Secondary | ICD-10-CM | POA: Diagnosis not present

## 2024-05-24 DIAGNOSIS — Z6841 Body Mass Index (BMI) 40.0 and over, adult: Secondary | ICD-10-CM | POA: Diagnosis not present

## 2024-05-26 DIAGNOSIS — M5416 Radiculopathy, lumbar region: Secondary | ICD-10-CM | POA: Diagnosis not present

## 2024-05-27 DIAGNOSIS — M5416 Radiculopathy, lumbar region: Secondary | ICD-10-CM | POA: Diagnosis not present

## 2024-05-27 DIAGNOSIS — Z9889 Other specified postprocedural states: Secondary | ICD-10-CM | POA: Diagnosis not present

## 2024-05-27 DIAGNOSIS — M5459 Other low back pain: Secondary | ICD-10-CM | POA: Diagnosis not present

## 2024-05-31 ENCOUNTER — Other Ambulatory Visit

## 2024-05-31 DIAGNOSIS — M5416 Radiculopathy, lumbar region: Secondary | ICD-10-CM | POA: Diagnosis not present

## 2024-06-06 ENCOUNTER — Ambulatory Visit: Admitting: Neurology

## 2024-06-06 DIAGNOSIS — F4310 Post-traumatic stress disorder, unspecified: Secondary | ICD-10-CM | POA: Diagnosis not present

## 2024-06-07 DIAGNOSIS — M51369 Other intervertebral disc degeneration, lumbar region without mention of lumbar back pain or lower extremity pain: Secondary | ICD-10-CM | POA: Diagnosis not present

## 2024-06-07 DIAGNOSIS — M545 Low back pain, unspecified: Secondary | ICD-10-CM | POA: Diagnosis not present

## 2024-06-07 DIAGNOSIS — M5416 Radiculopathy, lumbar region: Secondary | ICD-10-CM | POA: Diagnosis not present

## 2024-06-07 DIAGNOSIS — M961 Postlaminectomy syndrome, not elsewhere classified: Secondary | ICD-10-CM | POA: Diagnosis not present

## 2024-06-08 DIAGNOSIS — L97822 Non-pressure chronic ulcer of other part of left lower leg with fat layer exposed: Secondary | ICD-10-CM | POA: Diagnosis not present

## 2024-06-08 DIAGNOSIS — L97823 Non-pressure chronic ulcer of other part of left lower leg with necrosis of muscle: Secondary | ICD-10-CM | POA: Diagnosis not present

## 2024-06-14 DIAGNOSIS — M5136 Other intervertebral disc degeneration, lumbar region with discogenic back pain only: Secondary | ICD-10-CM | POA: Diagnosis not present

## 2024-06-14 DIAGNOSIS — M961 Postlaminectomy syndrome, not elsewhere classified: Secondary | ICD-10-CM | POA: Diagnosis not present

## 2024-06-14 DIAGNOSIS — M5416 Radiculopathy, lumbar region: Secondary | ICD-10-CM | POA: Diagnosis not present

## 2024-06-14 DIAGNOSIS — Z79899 Other long term (current) drug therapy: Secondary | ICD-10-CM | POA: Diagnosis not present

## 2024-06-16 DIAGNOSIS — Z6841 Body Mass Index (BMI) 40.0 and over, adult: Secondary | ICD-10-CM | POA: Diagnosis not present

## 2024-06-16 DIAGNOSIS — F3130 Bipolar disorder, current episode depressed, mild or moderate severity, unspecified: Secondary | ICD-10-CM | POA: Diagnosis not present

## 2024-06-16 DIAGNOSIS — G4700 Insomnia, unspecified: Secondary | ICD-10-CM | POA: Diagnosis not present

## 2024-06-20 ENCOUNTER — Ambulatory Visit (HOSPITAL_COMMUNITY): Payer: Self-pay | Admitting: Physician Assistant

## 2024-06-20 DIAGNOSIS — F4310 Post-traumatic stress disorder, unspecified: Secondary | ICD-10-CM | POA: Diagnosis not present

## 2024-06-22 DIAGNOSIS — L97823 Non-pressure chronic ulcer of other part of left lower leg with necrosis of muscle: Secondary | ICD-10-CM | POA: Diagnosis not present

## 2024-06-22 NOTE — Pre-Procedure Instructions (Signed)
 Surgical Instructions   Your procedure is scheduled on June 27, 2024. Report to [facility] Main Entrance A at 5:30 A.M., then check in with the Admitting office. Any questions or running late day of surgery: call [phone]  Questions prior to your surgery date: call [phone], Monday-Friday, 8am-4pm. If you experience any cold or flu symptoms such as cough, fever, chills, shortness of breath, etc. between now and your scheduled surgery, please notify us  at the above number.     Remember:  Do not eat after midnight the night before your surgery  You may drink clear liquids until 4:30 AM the morning of your surgery.   Clear liquids allowed are: Water, Non-Citrus Juices (without pulp), Carbonated Beverages, Clear Tea (no milk, honey, etc.), Black Coffee Only (NO MILK, CREAM OR POWDERED CREAMER of any kind), and Gatorade.    Take these medicines the morning of surgery with A SIP OF WATER: clonazePAM (KLONOPIN)  drospirenone -ethinyl estradiol  (YAZ)  Lurasidone   pantoprazole  (PROTONIX )  propranolol  (INDERAL )    May take these medicines IF NEEDED: albuterol (VENTOLIN HFA) inhaler - please bring inhaler with you morning of surgery dicyclomine  (BENTYL )  dronabinol  (MARINOL )  HYDROcodone-acetaminophen  (NORCO)  hydrOXYzine  (ATARAX )  ondansetron  (ZOFRAN -ODT)  rizatriptan  (MAXALT -MLT)    One week prior to surgery, STOP taking any Aspirin (unless otherwise instructed by your surgeon) Aleve, Naproxen, Ibuprofen, Motrin, Advil, Goody's, BC's, all herbal medications, fish oil, and non-prescription vitamins.                     Do NOT Smoke (Tobacco/Vaping) for 24 hours prior to your procedure.  If you use a CPAP at night, you may bring your mask/headgear for your overnight stay.   You will be asked to remove any contacts, glasses, piercing's, hearing aid's, dentures/partials prior to surgery. Please bring cases for these items if needed.    Patients discharged the day of  surgery will not be allowed to drive home, and someone needs to stay with them for 24 hours.  SURGICAL WAITING ROOM VISITATION Patients may have no more than 2 support people in the waiting area - these visitors may rotate.   Pre-op nurse will coordinate an appropriate time for 1 ADULT support person, who may not rotate, to accompany patient in pre-op.  Children under the age of 47 must have an adult with them who is not the patient and must remain in the main waiting area with an adult.  If the patient needs to stay at the hospital during part of their recovery, the visitor guidelines for inpatient rooms apply.  Please refer to the [facility] website for the visitor guidelines for any additional information.   If you received a COVID test during your pre-op visit  it is requested that you wear a mask when out in public, stay away from anyone that may not be feeling well and notify your surgeon if you develop symptoms. If you have been in contact with anyone that has tested positive in the last 10 days please notify you surgeon.      Pre-operative 4 CHG Bathing Instructions   You can play a key role in reducing the risk of infection after surgery. Your skin needs to be as free of germs as possible. You can reduce the number of germs on your skin by washing with CHG (chlorhexidine  gluconate) soap before surgery. CHG is an antiseptic soap that kills germs and continues to kill germs even after washing.   DO NOT use if  you have an allergy to chlorhexidine /CHG or antibacterial soaps. If your skin becomes reddened or irritated, stop using the CHG and notify one of our RNs at [phone].   Please shower with the CHG soap starting 4 days before surgery using the following schedule:     Please keep in mind the following:  DO NOT shave, including legs and underarms, starting the day of your first shower.   You may shave your face at any point before/day of surgery.  Place clean sheets on your  bed the day you start using CHG soap. Use a clean washcloth (not used since being washed) for each shower. DO NOT sleep with pets once you start using the CHG.   CHG Shower Instructions:  Wash your face and private area with normal soap. If you choose to wash your hair, wash first with your normal shampoo.  After you use shampoo/soap, rinse your hair and body thoroughly to remove shampoo/soap residue.  Turn the water OFF and apply  bottle of CHG soap to a CLEAN washcloth.  Apply CHG soap ONLY FROM YOUR NECK DOWN TO YOUR TOES (washing for 3-5 minutes)  DO NOT use CHG soap on face, private areas, open wounds, or sores.  Pay special attention to the area where your surgery is being performed.  If you are having back surgery, having someone wash your back for you may be helpful. Wait 2 minutes after CHG soap is applied, then you may rinse off the CHG soap.  Pat dry with a clean towel  Put on clean clothes/pajamas   If you choose to wear lotion, please use ONLY the CHG-compatible lotions that are listed below.  Additional instructions for the day of surgery:  If you choose, you may shower the morning of surgery with an antibacterial soap.  DO NOT APPLY any lotions, deodorants, cologne, or perfumes.   Do not bring valuables to the hospital. [facility] is not responsible for any belongings/valuables. Do not wear nail polish, gel polish, artificial nails, or any other type of covering on natural nails (fingers and toes) Do not wear jewelry or makeup Put on clean/comfortable clothes.  Please brush your teeth.  Ask your nurse before applying any prescription medications to the skin.     CHG Compatible Lotions   Aveeno Moisturizing lotion  Cetaphil Moisturizing Cream  Cetaphil Moisturizing Lotion  Clairol Herbal Essence Moisturizing Lotion, Dry Skin  Clairol Herbal Essence Moisturizing Lotion, Extra Dry Skin  Clairol Herbal Essence Moisturizing Lotion, Normal Skin  Curel Age Defying  Therapeutic Moisturizing Lotion with Alpha Hydroxy  Curel Extreme Care Body Lotion  Curel Soothing Hands Moisturizing Hand Lotion  Curel Therapeutic Moisturizing Cream, Fragrance-Free  Curel Therapeutic Moisturizing Lotion, Fragrance-Free  Curel Therapeutic Moisturizing Lotion, Original Formula  Eucerin Daily Replenishing Lotion  Eucerin Dry Skin Therapy Plus Alpha Hydroxy Crme  Eucerin Dry Skin Therapy Plus Alpha Hydroxy Lotion  Eucerin Original Crme  Eucerin Original Lotion  Eucerin Plus Crme Eucerin Plus Lotion  Eucerin TriLipid Replenishing Lotion  Keri Anti-Bacterial Hand Lotion  Keri Deep Conditioning Original Lotion Dry Skin Formula Softly Scented  Keri Deep Conditioning Original Lotion, Fragrance Free Sensitive Skin Formula  Keri Lotion Fast Absorbing Fragrance Free Sensitive Skin Formula  Keri Lotion Fast Absorbing Softly Scented Dry Skin Formula  Keri Original Lotion  Keri Skin Renewal Lotion Keri Silky Smooth Lotion  Keri Silky Smooth Sensitive Skin Lotion  Nivea Body Creamy Conditioning Oil  Nivea Body Extra Enriched Counsellor  Nivea Body Sheer Moisturizing Lotion Nivea Crme  Nivea Skin Firming Lotion  NutraDerm 30 Skin Lotion  NutraDerm Skin Lotion  NutraDerm Therapeutic Skin Cream  NutraDerm Therapeutic Skin Lotion  ProShield Protective Hand Cream  Provon moisturizing lotion  Please read over the following fact sheets that you were given.

## 2024-06-23 ENCOUNTER — Encounter (HOSPITAL_COMMUNITY)
Admission: RE | Admit: 2024-06-23 | Discharge: 2024-06-23 | Disposition: A | Discharge status: Home / Self Care | Source: Ambulatory Visit | Attending: Orthopedic Surgery | Admitting: Orthopedic Surgery

## 2024-06-23 ENCOUNTER — Encounter (HOSPITAL_COMMUNITY): Payer: Self-pay

## 2024-06-23 ENCOUNTER — Other Ambulatory Visit: Payer: Self-pay

## 2024-06-23 ENCOUNTER — Other Ambulatory Visit: Payer: Self-pay | Admitting: Gastroenterology

## 2024-06-23 VITALS — BP 135/87 | HR 84 | Temp 98.1°F | Resp 16 | Ht 63.0 in | Wt 235.6 lb

## 2024-06-23 DIAGNOSIS — F314 Bipolar disorder, current episode depressed, severe, without psychotic features: Secondary | ICD-10-CM | POA: Diagnosis not present

## 2024-06-23 DIAGNOSIS — Z01818 Encounter for other preprocedural examination: Secondary | ICD-10-CM | POA: Diagnosis not present

## 2024-06-23 DIAGNOSIS — Z1389 Encounter for screening for other disorder: Secondary | ICD-10-CM | POA: Diagnosis not present

## 2024-06-23 DIAGNOSIS — F419 Anxiety disorder, unspecified: Secondary | ICD-10-CM | POA: Diagnosis not present

## 2024-06-23 DIAGNOSIS — R9431 Abnormal electrocardiogram [ECG] [EKG]: Secondary | ICD-10-CM | POA: Diagnosis not present

## 2024-06-23 HISTORY — DX: Unspecified osteoarthritis, unspecified site: M19.90

## 2024-06-23 LAB — COMPREHENSIVE METABOLIC PANEL WITH GFR
ALT: 27 U/L (ref 0–44)
AST: 25 U/L (ref 15–41)
Albumin: 3.2 g/dL — ABNORMAL LOW (ref 3.5–5.0)
Alkaline Phosphatase: 54 U/L (ref 38–126)
Anion gap: 8 (ref 5–15)
BUN: 5 mg/dL — ABNORMAL LOW (ref 6–20)
CO2: 22 mmol/L (ref 22–32)
Calcium: 9.6 mg/dL (ref 8.9–10.3)
Chloride: 101 mmol/L (ref 98–111)
Creatinine, Ser: 0.91 mg/dL (ref 0.44–1.00)
GFR, Estimated: >60 mL/min (ref >60)
Glucose, Bld: 111 mg/dL — ABNORMAL HIGH (ref 70–99)
Potassium: 4.1 mmol/L (ref 3.5–5.1)
Sodium: 131 mmol/L — ABNORMAL LOW (ref 135–145)
Total Bilirubin: 0.5 mg/dL (ref 0.0–1.2)
Total Protein: 6.1 g/dL — ABNORMAL LOW (ref 6.5–8.1)

## 2024-06-23 LAB — CBC
HCT: 33.6 % — ABNORMAL LOW (ref 36.0–46.0)
Hemoglobin: 11.1 g/dL — ABNORMAL LOW (ref 12.0–15.0)
MCH: 25.6 pg — ABNORMAL LOW (ref 26.0–34.0)
MCHC: 33 g/dL (ref 30.0–36.0)
MCV: 77.4 fL — ABNORMAL LOW (ref 80.0–100.0)
Platelets: 279 K/uL (ref 150–400)
RBC: 4.34 MIL/uL (ref 3.87–5.11)
RDW: 15.4 % (ref 11.5–15.5)
WBC: 4.7 K/uL (ref 4.0–10.5)
nRBC: 0 % (ref 0.0–0.2)

## 2024-06-23 LAB — TYPE AND SCREEN
ABO/RH(D): A POS
Antibody Screen: NEGATIVE

## 2024-06-23 LAB — SURGICAL PCR SCREEN
MRSA, PCR: NEGATIVE
Staphylococcus aureus: NEGATIVE

## 2024-06-23 NOTE — Progress Notes (Addendum)
 PCP -  Marcellus Baptist MD Cardiologist -   PPM/ICD - denies Device Orders - n/a Rep Notified - n/a  Chest x-ray - denies EKG - 06-23-24 Stress Test - denies ECHO - denies Cardiac Cath - denies  Sleep Study - 2024 CPAP - not needed  Dm -denies  Blood Thinner Instructions:denies Aspirin Instructions:denies  ERAS Protcol -clear liquids until 4:30 am.    COVID TEST- n.a   Anesthesia review: no. Patient has wound to left knee had a fall in June. She is follow by wound clinic wound is getting smaller report. Patient reports surgeon is aware of area. Patient also doing own wound care. Last appointment with wound clinic was 06-22-24.  Patient denies shortness of breath, fever, cough and chest pain at PAT appointment   All instructions explained to the patient, with a verbal understanding of the material. Patient agrees to go over the instructions while at home for a better understanding. Patient also instructed to self quarantine after being tested for COVID-19. The opportunity to ask questions was provided.

## 2024-06-27 ENCOUNTER — Other Ambulatory Visit: Payer: Self-pay

## 2024-06-27 ENCOUNTER — Inpatient Hospital Stay (HOSPITAL_COMMUNITY)

## 2024-06-27 ENCOUNTER — Encounter (HOSPITAL_COMMUNITY)
Admission: RE | Disposition: A | Discharge status: Home / Self Care | Payer: Self-pay | Source: Home / Self Care | Attending: Orthopedic Surgery

## 2024-06-27 ENCOUNTER — Inpatient Hospital Stay (HOSPITAL_COMMUNITY)
Admission: RE | Admit: 2024-06-27 | Discharge: 2024-06-29 | DRG: 402 | Disposition: A | Discharge status: Home / Self Care | Attending: Orthopedic Surgery | Admitting: Orthopedic Surgery

## 2024-06-27 ENCOUNTER — Encounter (HOSPITAL_COMMUNITY): Payer: Self-pay | Admitting: Orthopedic Surgery

## 2024-06-27 DIAGNOSIS — F319 Bipolar disorder, unspecified: Secondary | ICD-10-CM | POA: Diagnosis not present

## 2024-06-27 DIAGNOSIS — E66813 Obesity, class 3: Secondary | ICD-10-CM | POA: Diagnosis not present

## 2024-06-27 DIAGNOSIS — I2510 Atherosclerotic heart disease of native coronary artery without angina pectoris: Secondary | ICD-10-CM

## 2024-06-27 DIAGNOSIS — F419 Anxiety disorder, unspecified: Secondary | ICD-10-CM | POA: Diagnosis not present

## 2024-06-27 DIAGNOSIS — I10 Essential (primary) hypertension: Secondary | ICD-10-CM | POA: Diagnosis present

## 2024-06-27 DIAGNOSIS — Z6841 Body Mass Index (BMI) 40.0 and over, adult: Secondary | ICD-10-CM | POA: Diagnosis not present

## 2024-06-27 DIAGNOSIS — E785 Hyperlipidemia, unspecified: Secondary | ICD-10-CM | POA: Diagnosis present

## 2024-06-27 DIAGNOSIS — M51372 Other intervertebral disc degeneration, lumbosacral region with discogenic back pain and lower extremity pain: Secondary | ICD-10-CM | POA: Diagnosis not present

## 2024-06-27 DIAGNOSIS — K219 Gastro-esophageal reflux disease without esophagitis: Secondary | ICD-10-CM | POA: Diagnosis present

## 2024-06-27 DIAGNOSIS — Z881 Allergy status to other antibiotic agents status: Secondary | ICD-10-CM

## 2024-06-27 DIAGNOSIS — K760 Fatty (change of) liver, not elsewhere classified: Secondary | ICD-10-CM | POA: Diagnosis present

## 2024-06-27 DIAGNOSIS — M5126 Other intervertebral disc displacement, lumbar region: Secondary | ICD-10-CM

## 2024-06-27 DIAGNOSIS — Z860100 Personal history of colon polyps, unspecified: Secondary | ICD-10-CM | POA: Diagnosis not present

## 2024-06-27 DIAGNOSIS — M5116 Intervertebral disc disorders with radiculopathy, lumbar region: Principal | ICD-10-CM | POA: Diagnosis present

## 2024-06-27 DIAGNOSIS — Z79899 Other long term (current) drug therapy: Secondary | ICD-10-CM

## 2024-06-27 DIAGNOSIS — Z79890 Hormone replacement therapy (postmenopausal): Secondary | ICD-10-CM

## 2024-06-27 DIAGNOSIS — Z79891 Long term (current) use of opiate analgesic: Secondary | ICD-10-CM

## 2024-06-27 DIAGNOSIS — Z87891 Personal history of nicotine dependence: Secondary | ICD-10-CM | POA: Diagnosis not present

## 2024-06-27 DIAGNOSIS — Z886 Allergy status to analgesic agent status: Secondary | ICD-10-CM

## 2024-06-27 DIAGNOSIS — Z885 Allergy status to narcotic agent status: Secondary | ICD-10-CM | POA: Diagnosis not present

## 2024-06-27 DIAGNOSIS — Z981 Arthrodesis status: Principal | ICD-10-CM

## 2024-06-27 DIAGNOSIS — Z888 Allergy status to other drugs, medicaments and biological substances status: Secondary | ICD-10-CM

## 2024-06-27 LAB — POCT PREGNANCY, URINE: Preg Test, Ur: NEGATIVE

## 2024-06-27 LAB — GLUCOSE, CAPILLARY: Glucose-Capillary: 222 mg/dL — ABNORMAL HIGH (ref 70–99)

## 2024-06-27 LAB — ABO/RH: ABO/RH(D): A POS

## 2024-06-27 SURGERY — POSTERIOR LUMBAR FUSION 1 LEVEL
Anesthesia: General | Site: Spine Lumbar

## 2024-06-27 MED ORDER — PROPOFOL 10 MG/ML IV BOLUS
INTRAVENOUS | Status: DC | PRN
  Administered 2024-06-27: 160 mg via INTRAVENOUS

## 2024-06-27 MED ORDER — METHOCARBAMOL 500 MG PO TABS: 500.0000 mg | ORAL_TABLET | Freq: Three times a day (TID) | ORAL | 0 refills | Status: AC | PRN

## 2024-06-27 MED ORDER — ONDANSETRON HCL 4 MG/2ML IJ SOLN
INTRAMUSCULAR | Status: AC
  Filled 2024-06-27: qty 2

## 2024-06-27 MED ORDER — SCOPOLAMINE 1 MG/3DAYS TD PT72
1.0000 | MEDICATED_PATCH | TRANSDERMAL | Status: DC
  Administered 2024-06-27: 1 mg via TRANSDERMAL
  Filled 2024-06-27: qty 1

## 2024-06-27 MED ORDER — ACETAMINOPHEN 500 MG PO TABS
1000.0000 mg | ORAL_TABLET | Freq: Once | ORAL | Status: DC
  Filled 2024-06-27: qty 2

## 2024-06-27 MED ORDER — PHENYLEPHRINE 80 MCG/ML (10ML) SYRINGE FOR IV PUSH (FOR BLOOD PRESSURE SUPPORT)
PREFILLED_SYRINGE | INTRAVENOUS | Status: DC | PRN
  Administered 2024-06-27: 160 ug via INTRAVENOUS

## 2024-06-27 MED ORDER — PROPOFOL 1000 MG/100ML IV EMUL
INTRAVENOUS | Status: AC
  Filled 2024-06-27: qty 400

## 2024-06-27 MED ORDER — TORSEMIDE 20 MG PO TABS: 20.0000 mg | ORAL_TABLET | Freq: Two times a day (BID) | ORAL | Status: DC | PRN

## 2024-06-27 MED ORDER — SODIUM CHLORIDE 0.9 % IV SOLN: 250.0000 mL | INTRAVENOUS | Status: AC

## 2024-06-27 MED ORDER — BUPIVACAINE-EPINEPHRINE (PF) 0.25% -1:200000 IJ SOLN
INTRAMUSCULAR | Status: AC
  Filled 2024-06-27: qty 30

## 2024-06-27 MED ORDER — PROPOFOL 500 MG/50ML IV EMUL
INTRAVENOUS | Status: DC | PRN
  Administered 2024-06-27: 75 ug/kg/min via INTRAVENOUS

## 2024-06-27 MED ORDER — THROMBIN 20000 UNITS EX SOLR
CUTANEOUS | Status: DC | PRN
  Administered 2024-06-27: 20 mL via TOPICAL

## 2024-06-27 MED ORDER — SURGIFLO WITH THROMBIN (HEMOSTATIC MATRIX KIT) OPTIME
TOPICAL | Status: DC | PRN
  Administered 2024-06-27 (×2): 1 via TOPICAL

## 2024-06-27 MED ORDER — HYDROMORPHONE HCL 1 MG/ML IJ SOLN
0.2500 mg | INTRAMUSCULAR | Status: DC | PRN
  Administered 2024-06-27 (×2): 0.5 mg via INTRAVENOUS

## 2024-06-27 MED ORDER — ONDANSETRON HCL 4 MG PO TABS: 4.0000 mg | ORAL_TABLET | Freq: Four times a day (QID) | ORAL | Status: DC | PRN

## 2024-06-27 MED ORDER — 0.9 % SODIUM CHLORIDE (POUR BTL) OPTIME
TOPICAL | Status: DC | PRN
  Administered 2024-06-27 (×3): 1000 mL

## 2024-06-27 MED ORDER — SUVOREXANT 10 MG PO TABS
10.0000 mg | ORAL_TABLET | Freq: Every day | ORAL | Status: DC
Start: 2024-06-27 — End: 2024-06-28

## 2024-06-27 MED ORDER — LACTATED RINGERS IV SOLN: INTRAVENOUS | Status: DC

## 2024-06-27 MED ORDER — ALBUTEROL SULFATE HFA 108 (90 BASE) MCG/ACT IN AERS: 2.0000 | INHALATION_SPRAY | Freq: Four times a day (QID) | RESPIRATORY_TRACT | Status: DC | PRN

## 2024-06-27 MED ORDER — OXYCODONE HCL 5 MG/5ML PO SOLN: 5.0000 mg | Freq: Once | ORAL | Status: DC | PRN

## 2024-06-27 MED ORDER — OXYCODONE HCL 5 MG PO TABS: 5.0000 mg | ORAL_TABLET | Freq: Once | ORAL | Status: DC | PRN

## 2024-06-27 MED ORDER — CEFAZOLIN SODIUM 1 G IJ SOLR
INTRAMUSCULAR | Status: AC
Start: 2024-06-27 — End: 2024-06-27
  Filled 2024-06-27: qty 20

## 2024-06-27 MED ORDER — ONDANSETRON HCL 4 MG PO TABS: 4.0000 mg | ORAL_TABLET | Freq: Three times a day (TID) | ORAL | 0 refills | Status: AC | PRN

## 2024-06-27 MED ORDER — OXYCODONE-ACETAMINOPHEN 10-325 MG PO TABS: 1.0000 | ORAL_TABLET | Freq: Four times a day (QID) | ORAL | 0 refills | Status: AC | PRN

## 2024-06-27 MED ORDER — DEXAMETHASONE SOD PHOSPHATE PF 10 MG/ML IJ SOLN
INTRAMUSCULAR | Status: DC | PRN
  Administered 2024-06-27: 10 mg via INTRAVENOUS

## 2024-06-27 MED ORDER — FENTANYL CITRATE (PF) 100 MCG/2ML IJ SOLN
INTRAMUSCULAR | Status: AC
  Filled 2024-06-27: qty 2

## 2024-06-27 MED ORDER — OXYCODONE HCL 5 MG PO TABS
10.0000 mg | ORAL_TABLET | ORAL | Status: DC | PRN
  Administered 2024-06-27 – 2024-06-29 (×12): 10 mg via ORAL
  Filled 2024-06-27 (×12): qty 2

## 2024-06-27 MED ORDER — TRANEXAMIC ACID-NACL 1000-0.7 MG/100ML-% IV SOLN
INTRAVENOUS | Status: DC | PRN
  Administered 2024-06-27: 1000 mg via INTRAVENOUS

## 2024-06-27 MED ORDER — IPRATROPIUM-ALBUTEROL 0.5-2.5 (3) MG/3ML IN SOLN
3.0000 mL | RESPIRATORY_TRACT | Status: DC
  Administered 2024-06-27: 3 mL via RESPIRATORY_TRACT

## 2024-06-27 MED ORDER — VANCOMYCIN HCL 500 MG IV SOLR
INTRAVENOUS | Status: DC | PRN
  Administered 2024-06-27: 500 mg via TOPICAL

## 2024-06-27 MED ORDER — CHLORHEXIDINE GLUCONATE 0.12 % MT SOLN
15.0000 mL | Freq: Once | OROMUCOSAL | Status: AC
  Administered 2024-06-27: 15 mL via OROMUCOSAL
  Filled 2024-06-27: qty 15

## 2024-06-27 MED ORDER — MIDAZOLAM HCL 2 MG/2ML IJ SOLN
INTRAMUSCULAR | Status: AC
  Filled 2024-06-27: qty 2

## 2024-06-27 MED ORDER — FAMOTIDINE 20 MG PO TABS
40.0000 mg | ORAL_TABLET | Freq: Every day | ORAL | Status: DC
  Administered 2024-06-27 – 2024-06-28 (×2): 40 mg via ORAL
  Filled 2024-06-27 (×2): qty 2

## 2024-06-27 MED ORDER — PHENOL 1.4 % MT LIQD: 1.0000 | OROMUCOSAL | Status: DC | PRN

## 2024-06-27 MED ORDER — DEXMEDETOMIDINE HCL IN NACL 80 MCG/20ML IV SOLN
INTRAVENOUS | Status: DC | PRN
  Administered 2024-06-27: 8 ug via INTRAVENOUS

## 2024-06-27 MED ORDER — SENNOSIDES-DOCUSATE SODIUM 8.6-50 MG PO TABS
1.0000 | ORAL_TABLET | Freq: Two times a day (BID) | ORAL | Status: DC | PRN
  Administered 2024-06-27: 2 via ORAL
  Filled 2024-06-27: qty 2

## 2024-06-27 MED ORDER — HYDROMORPHONE HCL 1 MG/ML IJ SOLN
1.0000 mg | INTRAMUSCULAR | Status: AC | PRN
  Administered 2024-06-27: 1 mg via INTRAVENOUS
  Filled 2024-06-27 (×2): qty 1

## 2024-06-27 MED ORDER — SPIRONOLACTONE 25 MG PO TABS
100.0000 mg | ORAL_TABLET | Freq: Every day | ORAL | Status: DC
  Administered 2024-06-28: 100 mg via ORAL
  Filled 2024-06-27 (×2): qty 4

## 2024-06-27 MED ORDER — HYDROXYZINE HCL 25 MG PO TABS
50.0000 mg | ORAL_TABLET | Freq: Four times a day (QID) | ORAL | Status: DC | PRN
Start: 2024-06-27 — End: 2024-06-29
  Filled 2024-06-27: qty 2

## 2024-06-27 MED ORDER — ORAL CARE MOUTH RINSE
15.0000 mL | Freq: Once | OROMUCOSAL | Status: AC
Start: 2024-06-27 — End: 2024-06-27

## 2024-06-27 MED ORDER — ALBUTEROL SULFATE (2.5 MG/3ML) 0.083% IN NEBU
2.5000 mg | INHALATION_SOLUTION | RESPIRATORY_TRACT | Status: DC | PRN
Start: 2024-06-27 — End: 2024-06-29

## 2024-06-27 MED ORDER — VANCOMYCIN HCL 500 MG IV SOLR
INTRAVENOUS | Status: AC
  Filled 2024-06-27: qty 10

## 2024-06-27 MED ORDER — LIDOCAINE 2% (20 MG/ML) 5 ML SYRINGE
INTRAMUSCULAR | Status: DC | PRN
  Administered 2024-06-27: 40 mg via INTRAVENOUS

## 2024-06-27 MED ORDER — BUPIVACAINE-EPINEPHRINE 0.25% -1:200000 IJ SOLN
INTRAMUSCULAR | Status: DC | PRN
  Administered 2024-06-27: 10 mL

## 2024-06-27 MED ORDER — SODIUM CHLORIDE 0.9% FLUSH: 3.0000 mL | INTRAVENOUS | Status: DC | PRN

## 2024-06-27 MED ORDER — PHENYLEPHRINE 80 MCG/ML (10ML) SYRINGE FOR IV PUSH (FOR BLOOD PRESSURE SUPPORT)
PREFILLED_SYRINGE | INTRAVENOUS | Status: AC
Start: 2024-06-27 — End: 2024-06-27
  Filled 2024-06-27: qty 10

## 2024-06-27 MED ORDER — PROPRANOLOL HCL 10 MG PO TABS
20.0000 mg | ORAL_TABLET | Freq: Three times a day (TID) | ORAL | Status: DC
  Administered 2024-06-27 – 2024-06-29 (×6): 20 mg via ORAL
  Filled 2024-06-27 (×7): qty 2

## 2024-06-27 MED ORDER — MENTHOL 3 MG MT LOZG: 1.0000 | LOZENGE | OROMUCOSAL | Status: DC | PRN

## 2024-06-27 MED ORDER — MIDAZOLAM HCL (PF) 2 MG/2ML IJ SOLN
INTRAMUSCULAR | Status: DC | PRN
  Administered 2024-06-27: 2 mg via INTRAVENOUS

## 2024-06-27 MED ORDER — FENTANYL CITRATE (PF) 250 MCG/5ML IJ SOLN
INTRAMUSCULAR | Status: DC | PRN
  Administered 2024-06-27 (×4): 50 ug via INTRAVENOUS
  Administered 2024-06-27 (×2): 100 ug via INTRAVENOUS

## 2024-06-27 MED ORDER — PROPOFOL 10 MG/ML IV BOLUS
INTRAVENOUS | Status: AC
  Filled 2024-06-27: qty 20

## 2024-06-27 MED ORDER — TRANEXAMIC ACID-NACL 1000-0.7 MG/100ML-% IV SOLN
INTRAVENOUS | Status: AC
  Filled 2024-06-27: qty 100

## 2024-06-27 MED ORDER — ONDANSETRON HCL 4 MG/2ML IJ SOLN: 4.0000 mg | Freq: Four times a day (QID) | INTRAMUSCULAR | Status: DC | PRN

## 2024-06-27 MED ORDER — SUCCINYLCHOLINE CHLORIDE 200 MG/10ML IV SOSY
PREFILLED_SYRINGE | INTRAVENOUS | Status: DC | PRN
  Administered 2024-06-27: 120 mg via INTRAVENOUS

## 2024-06-27 MED ORDER — DROPERIDOL 2.5 MG/ML IJ SOLN: 0.6250 mg | Freq: Once | INTRAMUSCULAR | Status: DC | PRN

## 2024-06-27 MED ORDER — POLYETHYLENE GLYCOL 3350 17 G PO PACK: 17.0000 g | PACK | Freq: Every day | ORAL | Status: DC | PRN

## 2024-06-27 MED ORDER — SODIUM CHLORIDE 0.9% FLUSH: 3.0000 mL | Freq: Two times a day (BID) | INTRAVENOUS | Status: DC

## 2024-06-27 MED ORDER — MEPERIDINE HCL 25 MG/ML IJ SOLN: 6.2500 mg | INTRAMUSCULAR | Status: DC | PRN

## 2024-06-27 MED ORDER — ONDANSETRON HCL 4 MG/2ML IJ SOLN
INTRAMUSCULAR | Status: DC | PRN
  Administered 2024-06-27: 4 mg via INTRAVENOUS

## 2024-06-27 MED ORDER — LURASIDONE HCL 40 MG PO TABS
120.0000 mg | ORAL_TABLET | Freq: Every day | ORAL | Status: DC
  Administered 2024-06-29: 120 mg via ORAL
  Filled 2024-06-27 (×3): qty 3

## 2024-06-27 MED ORDER — METHOCARBAMOL 500 MG PO TABS
500.0000 mg | ORAL_TABLET | Freq: Four times a day (QID) | ORAL | Status: DC | PRN
  Administered 2024-06-27 – 2024-06-29 (×7): 500 mg via ORAL
  Filled 2024-06-27 (×7): qty 1

## 2024-06-27 MED ORDER — OXYCODONE HCL 5 MG PO TABS: 5.0000 mg | ORAL_TABLET | ORAL | Status: DC | PRN

## 2024-06-27 MED ORDER — ROCURONIUM BROMIDE 10 MG/ML (PF) SYRINGE
PREFILLED_SYRINGE | INTRAVENOUS | Status: AC
Start: 2024-06-27 — End: 2024-06-27
  Filled 2024-06-27: qty 10

## 2024-06-27 MED ORDER — ACETAMINOPHEN 10 MG/ML IV SOLN: 1000.0000 mg | Freq: Once | INTRAVENOUS | Status: DC | PRN

## 2024-06-27 MED ORDER — FENTANYL CITRATE (PF) 250 MCG/5ML IJ SOLN
INTRAMUSCULAR | Status: AC
  Filled 2024-06-27: qty 5

## 2024-06-27 MED ORDER — CEFAZOLIN SODIUM-DEXTROSE 1-4 GM/50ML-% IV SOLN
1.0000 g | Freq: Three times a day (TID) | INTRAVENOUS | Status: AC
  Administered 2024-06-27 – 2024-06-28 (×2): 1 g via INTRAVENOUS
  Filled 2024-06-27 (×2): qty 50

## 2024-06-27 MED ORDER — METHOCARBAMOL 1000 MG/10ML IJ SOLN: 500.0000 mg | Freq: Four times a day (QID) | INTRAMUSCULAR | Status: DC | PRN

## 2024-06-27 MED ORDER — MAGNESIUM CITRATE PO SOLN
1.0000 | Freq: Once | ORAL | Status: DC | PRN
  Filled 2024-06-27: qty 296

## 2024-06-27 MED ORDER — DICYCLOMINE HCL 20 MG PO TABS: 40.0000 mg | ORAL_TABLET | Freq: Four times a day (QID) | ORAL | Status: DC | PRN

## 2024-06-27 MED ORDER — LIDOCAINE 2% (20 MG/ML) 5 ML SYRINGE
INTRAMUSCULAR | Status: AC
Start: 2024-06-27 — End: 2024-06-27
  Filled 2024-06-27: qty 5

## 2024-06-27 MED ORDER — IPRATROPIUM-ALBUTEROL 0.5-2.5 (3) MG/3ML IN SOLN
RESPIRATORY_TRACT | Status: AC
  Filled 2024-06-27: qty 3

## 2024-06-27 MED ORDER — CLONAZEPAM 1 MG PO TABS
2.0000 mg | ORAL_TABLET | Freq: Three times a day (TID) | ORAL | Status: DC | PRN
  Administered 2024-06-27 – 2024-06-29 (×4): 2 mg via ORAL
  Filled 2024-06-27 (×4): qty 2

## 2024-06-27 MED ORDER — PANTOPRAZOLE SODIUM 40 MG PO TBEC
80.0000 mg | DELAYED_RELEASE_TABLET | Freq: Two times a day (BID) | ORAL | Status: DC
Start: 2024-06-27 — End: 2024-06-29
  Administered 2024-06-27 – 2024-06-29 (×4): 80 mg via ORAL
  Filled 2024-06-27 (×5): qty 2

## 2024-06-27 MED ORDER — EPHEDRINE 5 MG/ML INJ
INTRAVENOUS | Status: AC
Start: 2024-06-27 — End: 2024-06-27
  Filled 2024-06-27: qty 5

## 2024-06-27 MED ORDER — FLUTICASONE FUROATE-VILANTEROL 100-25 MCG/ACT IN AEPB
1.0000 | INHALATION_SPRAY | Freq: Every day | RESPIRATORY_TRACT | Status: DC
  Filled 2024-06-27 (×2): qty 28

## 2024-06-27 MED ORDER — HYDROMORPHONE HCL 1 MG/ML IJ SOLN
INTRAMUSCULAR | Status: AC
  Filled 2024-06-27: qty 1

## 2024-06-27 MED ORDER — CEFAZOLIN SODIUM-DEXTROSE 2-4 GM/100ML-% IV SOLN
2.0000 g | INTRAVENOUS | Status: AC
  Administered 2024-06-27 (×2): 2 g via INTRAVENOUS
  Filled 2024-06-27: qty 100

## 2024-06-27 MED ORDER — THROMBIN 20000 UNITS EX SOLR
CUTANEOUS | Status: AC
  Filled 2024-06-27: qty 20000

## 2024-06-27 SURGICAL SUPPLY — 70 items
BAG COUNTER SPONGE SURGICOUNT (BAG) ×1 IMPLANT
BLADE CLIPPER SURG (BLADE) IMPLANT
BUR EGG ELITE 4.0 (BURR) ×1 IMPLANT
BUR NEURO DRILL SOFT 3.0X3.8M (BURR) IMPLANT
CABLE BIPOLOR RESECTION CORD (MISCELLANEOUS) ×1 IMPLANT
CAGE MOD EX PL 7X9X24 17D (Cage) IMPLANT
CANISTER SUCTION 3000ML PPV (SUCTIONS) ×1 IMPLANT
CAP RELINE MOD TULIP RMM (Cap) IMPLANT
CLIP NEUROVISION LG (NEUROSURGERY SUPPLIES) IMPLANT
CLSR STERI-STRIP ANTIMIC 1/2X4 (GAUZE/BANDAGES/DRESSINGS) ×1 IMPLANT
CNTNR URN SCR LID CUP LEK RST (MISCELLANEOUS) IMPLANT
COVER SURGICAL LIGHT HANDLE (MISCELLANEOUS) ×1 IMPLANT
DERMABOND ADVANCED .7 DNX12 (GAUZE/BANDAGES/DRESSINGS) IMPLANT
DRAPE C-ARM 42X72 X-RAY (DRAPES) ×1 IMPLANT
DRAPE C-ARMOR (DRAPES) ×1 IMPLANT
DRAPE POUCH INSTRU U-SHP 10X18 (DRAPES) ×1 IMPLANT
DRAPE SURG 17X23 STRL (DRAPES) ×1 IMPLANT
DRAPE U-SHAPE 47X51 STRL (DRAPES) ×1 IMPLANT
DRSG OPSITE POSTOP 4X10 (GAUZE/BANDAGES/DRESSINGS) IMPLANT
DRSG OPSITE POSTOP 4X8 (GAUZE/BANDAGES/DRESSINGS) ×1 IMPLANT
DURAPREP 26ML APPLICATOR (WOUND CARE) ×1 IMPLANT
ELECT BLADE 6.5 EXT (BLADE) IMPLANT
ELECT CAUTERY BLADE 6.4 (BLADE) IMPLANT
ELECT NVM5 SURFACE MEP/EMG (ELECTRODE) IMPLANT
ELECT PENCIL ROCKER SW 15FT (MISCELLANEOUS) ×1 IMPLANT
ELECTRODE BLDE 4.0 EZ CLN MEGD (MISCELLANEOUS) ×1 IMPLANT
ELECTRODE REM PT RTRN 9FT ADLT (ELECTROSURGICAL) ×1 IMPLANT
GLOVE BIOGEL PI IND STRL 8.5 (GLOVE) ×1 IMPLANT
GLOVE SS BIOGEL STRL SZ 8.5 (GLOVE) ×1 IMPLANT
GOWN STRL REUS W/ TWL LRG LVL3 (GOWN DISPOSABLE) ×1 IMPLANT
GOWN STRL REUS W/TWL 2XL LVL3 (GOWN DISPOSABLE) ×2 IMPLANT
KIT BASIN OR (CUSTOM PROCEDURE TRAY) ×1 IMPLANT
KIT POSITIONER JACKSON TABLE (MISCELLANEOUS) IMPLANT
KIT TURNOVER KIT B (KITS) ×1 IMPLANT
MODULE EMG NDL SSEP NVM5 (NEUROSURGERY SUPPLIES) IMPLANT
MODULE EMG NEEDLE SSEP NVM5 (NEUROSURGERY SUPPLIES) ×1 IMPLANT
NDL 22X1.5 STRL (OR ONLY) (MISCELLANEOUS) ×1 IMPLANT
NDL SPNL 18GX3.5 QUINCKE PK (NEEDLE) ×2 IMPLANT
NEEDLE 22X1.5 STRL (OR ONLY) (MISCELLANEOUS) IMPLANT
NEEDLE SPNL 18GX3.5 QUINCKE PK (NEEDLE) ×2 IMPLANT
PACK LAMINECTOMY ORTHO (CUSTOM PROCEDURE TRAY) ×1 IMPLANT
PACK UNIVERSAL I (CUSTOM PROCEDURE TRAY) ×1 IMPLANT
PAD ARMBOARD POSITIONER FOAM (MISCELLANEOUS) ×2 IMPLANT
PATTIES SURGICAL .5 X.5 (GAUZE/BANDAGES/DRESSINGS) IMPLANT
PATTIES SURGICAL .5 X1 (DISPOSABLE) ×1 IMPLANT
POSITIONER HEAD PRONE TRACH (MISCELLANEOUS) ×1 IMPLANT
PROBE BALL TIP NVM5 SNG USE (NEUROSURGERY SUPPLIES) IMPLANT
PUTTY DBM INSTAFILL CART 5CC (Putty) IMPLANT
ROD RELINE 5.0X45MM (Rod) IMPLANT
ROD RELINE COCR LORD 5X40MM (Rod) IMPLANT
SCREW LOCK RSS 4.5/5.0MM (Screw) IMPLANT
SHANK RELINE MOD 5.5X40 (Screw) IMPLANT
SOLN 0.9% NACL POUR BTL 1000ML (IV SOLUTION) ×1 IMPLANT
SOLN STERILE WATER BTL 1000 ML (IV SOLUTION) ×1 IMPLANT
SPONGE SURGIFOAM ABS GEL 100 (HEMOSTASIS) ×1 IMPLANT
SPONGE T-LAP 4X18 ~~LOC~~+RFID (SPONGE) ×2 IMPLANT
SURGIFLO W/THROMBIN 8M KIT (HEMOSTASIS) IMPLANT
SUT BONE WAX W31G (SUTURE) ×1 IMPLANT
SUT MNCRL AB 3-0 PS2 27 (SUTURE) ×2 IMPLANT
SUT STRATAFIX 1PDS 45CM VIOLET (SUTURE) IMPLANT
SUT VIC AB 1 CT1 18XCR BRD 8 (SUTURE) ×1 IMPLANT
SUT VIC AB 2-0 CT1 18 (SUTURE) ×1 IMPLANT
SYR 3ML LL SCALE MARK (SYRINGE) IMPLANT
SYR BULB IRRIG 60ML STRL (SYRINGE) ×1 IMPLANT
SYR CONTROL 10ML LL (SYRINGE) ×1 IMPLANT
TIP CONICAL INSTAFILL (ORTHOPEDIC DISPOSABLE SUPPLIES) IMPLANT
TOWEL GREEN STERILE (TOWEL DISPOSABLE) ×1 IMPLANT
TOWEL GREEN STERILE FF (TOWEL DISPOSABLE) ×1 IMPLANT
TRAY FOLEY MTR SLVR 16FR STAT (SET/KITS/TRAYS/PACK) ×1 IMPLANT
YANKAUER SUCT BULB TIP NO VENT (SUCTIONS) ×1 IMPLANT

## 2024-06-27 NOTE — Discharge Instructions (Signed)

## 2024-06-27 NOTE — Op Note (Signed)
 OPERATIVE REPORT  DATE OF SURGERY: 06/27/2024  PATIENT NAME:  Kayla Holden MRN: [id number] DOB: 1981/01/25  PCP: Ina Marcellus RAMAN, MD  PRE-OPERATIVE DIAGNOSIS: Recurrent lumbar disc herniation L4-5 with right radiculopathy and degenerative disc disease.  Prior right L4-5 hemilaminotomy and discectomy  POST-OPERATIVE DIAGNOSIS: Same  PROCEDURE:   TLIF L4-5  SURGEON:  Donaciano Sprang, MD  PHYSICIAN ASSISTANT: Jeoffrey Sages, PA  ANESTHESIA:   General  EBL: 250 ml   Complications: None  Neuromonitoring: No adverse free running EMG or SSEP activity throughout the case.  All pedicle screws were directly stimulated.  Left side: L4 positive activity at 24 mA, L5 positive activity at 14 mA.  Right side: Positive activity at L4 at 11 mA and positive activity at L5 at 24 mA.  Implants: NuVasive expandable TLIF cage.  7 x 9 x 24.  Expanded to anterior height of 11 mm and posterior height of 9 mm producing 5 degrees of lordosis.  NuVasive cortical pedicle screws: 5.5 x 40 mm length screws at all 4 levels.  40 mm length rod on the left side and 45 mm length right on the right.  Graft: Local graft from decompression along with DBX mix.  BRIEF HISTORY: Kayla Holden is a 43 y.o. female who had a previous lumbar discectomy and initially did quite well.  Then unfortunately she suffered a recurrent disc herniation with return of severe radiculopathy and motor deficits in the right lower extremity.  Imaging showed a new recurrent disc herniation on the right side at L4-5.  As a result of the severe pain and neurological deficits we elected to move forward with a revision surgery.  Due to the location of the disc herniation and the prior surgery I elected to move forward with a TLIF as she would require extensive decompression or to safely remove the disc fragment and this would lead to instability.  All appropriate risks benefits and alternatives were discussed with the patient and consent was  obtained.  PROCEDURE DETAILS: Patient was brought into the operating room and was properly positioned on the operating room table.  After induction with general anesthesia the patient was endotracheally intubated.  A timeout was taken to confirm all important data: including patient, procedure, and the level. Teds, SCD's were applied.   A Foley was placed by the nurse and all intraoperative needles for SSEP and EMG monitoring were placed by the representative.  Patient was then turned prone onto the spine frame and properly positioned.  The lumbar spine was then prepped and draped in a standard fashion.  The prior incision was infiltrated with quarter percent Marcaine  with epinephrine .  I extended the incision both cephalad and caudad and sharply dissected down to the deep fascia.  I incised the deep fascia and then began working on the left side.  I stripped the paraspinal muscles to expose the L4 and L5 spinous process as well as the L3-4 and L4-5 facet capsules.  Once I had the left side exposed I then exposed the L4 pars on this left side.  I now had a posterior exposure complete on the left side.  I then went to the right side and the begin working cephalad until I exposed to portion of the L4 spinous process and the lamina that was still intact.  I then dissected out and expose the L3-4 facet complex.  Care was taken not to violate the facet capsule.  I then exposed the L5 spinous process and the L5 lamina  and the L4-5 facet complex.  I now could remove the bulk of the scar tissue using double-action Leksell rongeur.  Once I had removed the bulk of the scar tissue I proceeded with the instrumentation.  A high-speed bur was placed on the inferior medial corner of the pedicle as seen on the anterior fluoroscopy view.  I breach the cortex and then placed the pedicle awl.  I advanced the awl towards the superior lateral corner of the pedicle.  Once I was in the lateral third of the pedicle I checked on the  lateral view to ensure that I was just beyond the posterior wall of the vertebral body.  Once I was satisfied with the trajectory and position I advanced into the vertebral body.  I removed the awl and then palpated the hole with a ball-tipped feeler to confirm that it was intact.  I then placed a tap and then repalpated and confirmed the canal was intact.  I then placed a screw.  I repeated this exact same technique on the contralateral side at L4 and bilaterally at L5.  Once all 4 pedicle screws were properly position I then directly stimulated them and there was no adverse free running EMG activity suggest breach of the pedicle or direct neural irritation by the screw.  On the left-hand side I performed a laminotomy of L4 and then removed the inferior portion of the L4 spinous process.  Starting on the left side I was able to identify the plane between the thecal sac and the ligamentum flavum.  Using a Penfield 4 I created a plane between the thecal sac and the ligamentum flavum and resected on the left side.  I then began working from the left side over into the right.  I was able to develop the plane between the ligamentum flavum and the scar tissue and resected with a Kerrison rongeur.  I expanded the L4 laminotomy superiorly and then ultimately resected the inferior L4 articulating facet and the L4 pars.  I then continued to dissect in the lateral recess developing a plane between the scar and the bone.  I used my Kerrison rongeur to resect the remaining portion of the facet complex to complete my lateral recess decompression.  I could now visualize the L5 pedicle.  I then used my Penfield 4 to begin mobilizing the thecal sac to expose the posterior annulus.  I incised the annulus and used pituitary rongeurs and nerve hooks to deliver large fragments of disc material in produce my discectomy.  I then used curettes to remove the remaining disc as I worked towards the contralateral side in the  intervertebral space.  As I was removing the intervertebral disc this allowed me to mobilize the thecal sac more and more medially in order to expose the disc herniation that was seen on the preoperative MRI.  Using my nerve hooks I could now sweep directly underneath the thecal sac and I delivered 3 large fragments of disc material that ultimately were consistent with the disc herniation seen on the preoperative MRI.  Once I had removed all of this I could now freely mobilized the thecal sac medially in order to expand my annulotomy.  I could also freely mobilize the L5 nerve root and passed my nerve hook under the L5 nerve root into the L5 foramen.  The L4 nerve root was directly visualized after completing my resection of the pars.  At this point I was quite pleased with the overall decompression and discectomy.  I felt as though I removed all of the disc material that was dictated the disc herniation and removed the majority of the disc material from the intervertebral space.  I then trialed irrigated copiously with normal saline and then placed the 7 x 9 x 24 implant into the disc space.  I made sure to angle it so it is across the midline.  Images in both planes demonstrate satisfactory final positioning of the intervertebral spacer.  I then expanded the anterior and posterior portions of the spacer in order to contact the endplates.  Once I had adequate fixation I removed the inserting device.  DBX was used to backfill into the cage.  I then used autograft bone that had collected from the decompression and placed this lateral to the cage in order to facilitate solid fusion.  Images demonstrated satisfactory positioning of the cage in both the AP and lateral planes.  At this point I turned my into the segmental fixation.  After irrigation of the wound I exposed the left L4-5 facet and removed the facet capsule with Bovie.  Using a high-speed bur I decorticated the L4-5 facet complex and the L4 transverse  process.  I then placed the remaining portion of autograft bone in the posterior lateral gutter and the L4-5 facet complex.  I then attached the polyaxial heads and then measured and placed a 40 mm length rod.  It was secured with a locking caps.  Both locking caps were final tightened according manufacture standards.  I then went back to the right-hand side irrigated the wound copiously with saline and made sure it hemostasis.  I did 1 final sweep with my nerve hook along the lateral recess and into the L5 foramen centrally and superiorly.  The exiting L4 and traversing L5 nerve roots were freely mobile and no longer under compression.  I could freely pass under the central portion of the thecal sac and the mobilized the thecal sac as it was no longer under compression.  I then attached the polyaxial heads and placed a 45 mm length rod.  This was locked in place with the locking caps which were tightened according manufacture standards.  I then placed a thrombin-soaked Gelfoam patty over the laminotomy site and removed my retractors.  After final irrigation I placed an vancomycin  powder and then closed the deep fascia with interrupted #1 Vicryl sutures.  Wound was irrigated again and additional antibiotics were placed and then I closed in a layered fashion with a running 0 Vicryl suture followed by interrupted 2-0 Vicryl sutures.  The skin was reapproximated with a 3-0 Monocryl.  Steri-Strips dry dressings were applied and the patient was ultimately extubated transferred to the PACU without incident.  The end of the case all needle sponge counts were correct.  Donaciano Sprang, MD 06/27/2024 11:49 AM

## 2024-06-27 NOTE — Anesthesia Preprocedure Evaluation (Addendum)
 Anesthesia Evaluation  Patient identified by MRN, date of birth, ID band Patient awake    Reviewed: Allergy & Precautions, NPO status , Patient's Chart, lab work & pertinent test results  History of Anesthesia Complications (+) PONV and history of anesthetic complications  Airway Mallampati: I  TM Distance: >3 FB Neck ROM: Full    Dental  (+) Teeth Intact, Dental Advisory Given   Pulmonary former smoker   breath sounds clear to auscultation       Cardiovascular hypertension, + CAD   Rhythm:Regular Rate:Normal     Neuro/Psych  Headaches PSYCHIATRIC DISORDERS Anxiety Depression Bipolar Disorder      GI/Hepatic Neg liver ROS,GERD  ,,  Endo/Other  negative endocrine ROS    Renal/GU negative Renal ROS     Musculoskeletal  (+) Arthritis ,    Abdominal   Peds  Hematology  (+) Blood dyscrasia, anemia   Anesthesia Other Findings   Reproductive/Obstetrics                              Anesthesia Physical Anesthesia Plan  ASA: 3  Anesthesia Plan: General   Post-op Pain Management: Tylenol  PO (pre-op)*   Induction: Intravenous  PONV Risk Score and Plan: 4 or greater and Ondansetron , Dexamethasone , Propofol  infusion, Midazolam  and Scopolamine patch - Pre-op  Airway Management Planned: Oral ETT  Additional Equipment: None  Intra-op Plan:   Post-operative Plan: Extubation in OR  Informed Consent: I have reviewed the patients History and Physical, chart, labs and discussed the procedure including the risks, benefits and alternatives for the proposed anesthesia with the patient or authorized representative who has indicated his/her understanding and acceptance.     Dental advisory given  Plan Discussed with: CRNA  Anesthesia Plan Comments: (BIS monitor)         Anesthesia Quick Evaluation

## 2024-06-27 NOTE — H&P (Signed)
 History: The patient is a 43 year old female who presents for pre-operative visit in preparation for their TLIF L4-5, which is scheduled on 06/27/24 with Dr. Donaciano Sprang, MD at [facility]. The patient has had symptoms in the Back including pain which has impacted their quality of life and ability to do activities of daily living. The patient currently has a diagnosis of lumbar Radiculopathy and has failed conservative treatments including activity modification, PT, pain medical management, and injection therapy . The patient has had previous discetomy . The patient denies an active infection.  Past Medical History:  Diagnosis Date   Anemia    Anxiety    Anxiety    Arthritis    Bipolar 1 disorder (HCC)    CAD (coronary artery disease)    Chiari malformation    Colitis    Colon polyps    Complication of anesthesia    CYP2D6 extensive metabolizer    Depression    Endometriosis    Fall 02/04/2024   wound vac, skin ulcer of L knee with fat layer exposed. wound dehiscence.   Fatty liver    GERD (gastroesophageal reflux disease)    Hyperlipidemia    Hypertension    IBS (irritable bowel syndrome)    MDD (major depressive disorder)    Microscopic colitis    Migraine headache    Panic disorder with agoraphobia    PONV (postoperative nausea and vomiting)    Vitamin B 12 deficiency     Allergies  Allergen Reactions   Codeine Other (See Comments)    Patient has CYP2D6, so NO CODEINE   Fioricet-Codeine [Butalbital-Apap-Caff-Cod] Other (See Comments)    Patient has CYP2D6 and cannot tolerate codeine, palpitations, rapid heart rate   Isoniazid Shortness Of Breath   Ketorolac  Tromethamine  Other (See Comments)    Abdominal Pain if given PO. It has to be given IV   Rifampin Shortness Of Breath and Hives   Tape Other (See Comments)    Paper tape causes blisters    Clindamycin Diarrhea and Other (See Comments)    Abdominal pain, also   Ketoprofen Other (See Comments)     Abdominal pain and cannot take due to history of colitis   Nsaids Other (See Comments)    History of colitis    No current facility-administered medications on file prior to encounter.   Current Outpatient Medications on File Prior to Encounter  Medication Sig Dispense Refill   albuterol (VENTOLIN HFA) 108 (90 Base) MCG/ACT inhaler Inhale 2 puffs into the lungs every 6 (six) hours as needed for shortness of breath or wheezing.     clonazePAM (KLONOPIN) 2 MG tablet Take 2 mg by mouth in the morning, at noon, and at bedtime.     dicyclomine  (BENTYL ) 20 MG tablet Take 2 tablets (40 mg total) by mouth 4 (four) times daily as needed for spasms. 240 tablet 0   dronabinol  (MARINOL ) 5 MG capsule Take 1 capsule (5 mg total) by mouth 2 (two) times daily. (Patient taking differently: Take 5 mg by mouth 2 (two) times daily as needed (if zofran  doesn't work).) 60 capsule 0   drospirenone -ethinyl estradiol  (YAZ) 3-0.02 MG tablet Take 1 tablet by mouth daily with lunch.     HYDROcodone-acetaminophen  (NORCO) 10-325 MG tablet Take 1 tablet by mouth in the morning, at noon, in the evening, and at bedtime.     hydrOXYzine  (ATARAX ) 50 MG tablet Take 50 mg by mouth every 6 (six) hours as needed  for anxiety.     Lurasidone  HCl 120 MG TABS Take 120 mg by mouth daily.     ondansetron  (ZOFRAN -ODT) 8 MG disintegrating tablet Take 1 tablet (8 mg total) by mouth every 8 (eight) hours as needed for nausea (DISSOLVE ORALLY). 60 tablet 0   pantoprazole  (PROTONIX ) 40 MG tablet Take 2 tablets (80 mg total) by mouth 2 (two) times daily. 360 tablet 3   propranolol  (INDERAL ) 20 MG tablet Take 20 mg by mouth 3 (three) times daily.     rizatriptan  (MAXALT -MLT) 10 MG disintegrating tablet Take 10 mg by mouth as needed for migraine.     rosuvastatin  (CRESTOR ) 10 MG tablet Take 1 tablet (10 mg total) by mouth at bedtime. 30 tablet 0   spironolactone  (ALDACTONE ) 100 MG tablet Take 1 tablet (100 mg total) by mouth daily at 12 noon.  30 tablet 0   Suvorexant (BELSOMRA) 10 MG TABS Take 10 mg by mouth daily.     torsemide (DEMADEX) 20 MG tablet Take 20 mg by mouth 2 (two) times daily as needed (fluid).     zolpidem  (AMBIEN  CR) 12.5 MG CR tablet Take 1 tablet (12.5 mg total) by mouth at bedtime. 30 tablet 0   cholecalciferol  (CHOLECALCIFEROL ) 25 MCG tablet Take 1 tablet (1,000 Units total) by mouth daily. (Patient not taking: Reported on 06/21/2024) 30 tablet 0   cyanocobalamin  1000 MCG tablet Take 1 tablet (1,000 mcg total) by mouth daily. (Patient not taking: Reported on 06/21/2024) 30 tablet 0   diphenoxylate -atropine  (LOMOTIL ) 2.5-0.025 MG tablet Take 1 tablet by mouth daily.     propranolol  (INDERAL ) 10 MG tablet Take 1 tablet (10 mg total) by mouth 3 (three) times daily. (Patient not taking: Reported on 06/21/2024) 90 tablet 0    Physical Exam: Vitals:   06/27/24 0556  BP: 134/73  Pulse: 76  Resp: 18  Temp: 98.2 F (36.8 C)  SpO2: 98%   Body mass index is 41.63 kg/m. Clinical exam: Kayla Holden is a pleasant individual, who appears younger than their stated age.   She is alert and orientated 3.   No shortness of breath, chest pain.   Abdomen is soft and non-tender, negative loss of bowel and bladder control, no rebound tenderness.   Negative: skin lesions abrasions contusions  Peripheral pulses: 2+ peripheral pulses bilaterally in the lower extremity.  LE compartments are: Soft and nontender.  Gait pattern: Altered gait pattern due to severe right radicular leg pain  Assistive devices: walker  Neuro: Positive right straight leg raise test with numbness and dysesthesias in the L5 dermatome. 4/5 right EHL/tibialis anterior strength. 5/5 right gastrocnemius, hip flexor, quad function. 5/5 motor strength in the left lower extremity. Positive numbness and dysesthesias in the right L5 dermatome. Negative Babinski test, no clonus. 1+ deep tendon reflexes symmetrically in the lower extremity.  Musculoskeletal:  Moderate to severe low back pain radiating into the right lower extremity. No SI joint pain with direct palpation. Well-healed surgical scar from prior right L4-5 discectomy  Imaging: X-rays demonstrate degenerative lumbar disc disease L4-5. No spondylolisthesis.  Lumbar MRI: completed on 05/31/2024:  Previous right L4 hemilaminectomy is noted. There is a large recurrent right sided disc herniation causing significant displacement and compression of the traversing right L5 nerve root. Modic type I endplate degenerative changes at L4-5. Small left L5-S1 disc protrusion. No disc herniation or stenosis L1-4.   A/P: Kayla Holden is a very pleasant 43 year old woman with a recurrent lumbar disc herniation at L4-5 with L5 radiculopathy. Patient  has neurological deficits on motor and sensory testing as well as positive nerve root tension signs. Imaging clearly shows a recurrent large disc herniation on the right side at L4-5. In order to address this surgically she would need a more extensive decompression on the right side which more than likely would produce instability. Given the fact that she has already had a previous decompressive surgery at this point I have recommended a TLIF. This would provide adequate room to do the revision decompression and then stabilize her to prevent further instability.  I have gone over the surgical procedure in great detail with the patient and her father and all of their questions were addressed. Will plan on moving forward with the fusion in the near future. I have given her some literature to review. Patient will follow-up with me for preop H&P.  Risks and benefits of lumbar decompression/discectomy: Infection, bleeding, death, stroke, paralysis, ongoing or worse pain, need for additional surgery, leak of spinal fluid, adjacent segment degeneration requiring additional surgery, post-operative hematoma formation that can result in neurological compromise and the need for  urgent/emergent re-operation. Loss in bowel and bladder control. Injury to major vessels that could result in the need for urgent abdominal surgery to stop bleeding. Risk of deep venous thrombosis (DVT) and the need for additional treatment. Recurrent disc herniation resulting in the need for revision surgery, which could include fusion surgery (utilizing instrumentation such as pedicle screws and intervertebral cages).  Risks and benefits of posterior spinal fusion: Infection, bleeding, death, stroke, paralysis, ongoing or worse pain, need for additional surgery, nonunion, leak of spinal fluid, adjacent segment degeneration requiring additional fusion surgery, Injury to abdominal vessels that can require anterior surgery to stop bleeding. Malposition of the cage and/or pedicle screws that could require additional surgery. Loss of bowel and bladder control. Postoperative hematoma causing neurologic compression that could require urgent or emergent re-operation.

## 2024-06-27 NOTE — Transfer of Care (Signed)
 Immediate Anesthesia Transfer of Care Note  Patient: Kayla Holden  Procedure(s) Performed: TRANSFORAMINAL LUMBAR INTERBODY FUSION OF LUMBAR FOUR TO LUMBAR FIVE (Spine Lumbar)  Patient Location: PACU  Anesthesia Type:General  Level of Consciousness: drowsy  Airway & Oxygen Therapy: Patient Spontanous Breathing and Patient connected to face mask oxygen  Post-op Assessment: Report given to RN and Post -op Vital signs reviewed and stable  Post vital signs: Reviewed and stable  Last Vitals:  Vitals Value Taken Time  BP 99/66 06/27/24 12:32  Temp    Pulse 106 06/27/24 12:35  Resp 6 06/27/24 12:35  SpO2 94 % 06/27/24 12:35  Vitals shown include unfiled device data.  Last Pain:  Vitals:   06/27/24 0655  TempSrc:   PainSc: 10-Worst pain ever      Patients Stated Pain Goal: 4 (06/27/24 9344)  Complications: No notable events documented.

## 2024-06-27 NOTE — Anesthesia Procedure Notes (Signed)
 Procedure Name: Intubation Date/Time: 06/27/2024 7:38 AM  Performed by: Elby Raelene SAUNDERS, CRNAPre-anesthesia Checklist: Patient identified, Emergency Drugs available, Suction available and Patient being monitored Patient Re-evaluated:Patient Re-evaluated prior to induction Oxygen Delivery Method: Circle System Utilized Preoxygenation: Pre-oxygenation with 100% oxygen Induction Type: IV induction Ventilation: Mask ventilation without difficulty Laryngoscope Size: Miller and 2 Grade View: Grade II Tube type: Oral Tube size: 7.0 mm Number of attempts: 1 Airway Equipment and Method: Stylet and Bite block Placement Confirmation: ETT inserted through vocal cords under direct vision, positive ETCO2 and breath sounds checked- equal and bilateral Secured at: 22 cm Tube secured with: Tape Dental Injury: Teeth and Oropharynx as per pre-operative assessment

## 2024-06-27 NOTE — Brief Op Note (Signed)
 04/21/2024   10:23 AM   PATIENT:  06/27/2024  12:22 PM  PATIENT:  Bridgette DELENA Batter  43 y.o. female  PRE-OPERATIVE DIAGNOSIS:  RECURRENT L4-5 HERNIATED NUCLEUS PULPOSUS  POST-OPERATIVE DIAGNOSIS:  RECURRENT L4-5 HERNIATED NUCLEUS PULPOSUS  PROCEDURE:  Procedure(s) with comments: TRANSFORAMINAL LUMBAR INTERBODY FUSION OF LUMBAR FOUR TO LUMBAR FIVE (N/A) - Transforaminal lumbar interbody fusion L4-5  SURGEON:  Surgeons and Role:    DEWAINE Burnetta Aures, MD - Primary  PHYSICIAN ASSISTANT:  Jeoffrey Sages, PA  ANESTHESIA:   general  EBL:  250 mL   BLOOD ADMINISTERED:none  DRAINS: none   LOCAL MEDICATIONS USED:  MARCAINE      SPECIMEN:  No Specimen  DISPOSITION OF SPECIMEN:  N/A  COUNTS:  YES  TOURNIQUET:  * No tourniquets in log *  DICTATION: .Dragon Dictation  PLAN OF CARE: Admit to inpatient   PATIENT DISPOSITION:  PACU - hemodynamically stable.

## 2024-06-28 MED ORDER — SUMATRIPTAN SUCCINATE 25 MG PO TABS
25.0000 mg | ORAL_TABLET | ORAL | Status: DC | PRN
Start: 2024-06-28 — End: 2024-06-29
  Administered 2024-06-28: 25 mg via ORAL
  Filled 2024-06-28 (×2): qty 2

## 2024-06-28 MED ORDER — ROSUVASTATIN CALCIUM 5 MG PO TABS
10.0000 mg | ORAL_TABLET | Freq: Every day | ORAL | Status: DC
  Administered 2024-06-28: 10 mg via ORAL
  Filled 2024-06-28: qty 2

## 2024-06-28 NOTE — Evaluation (Signed)
 Occupational Therapy Evaluation Patient Details Name: Kayla Holden MRN: [id number] DOB: 1981-02-22 Today's Date: 06/28/2024   History of Present Illness   Kayla Holden  43 y.o. female who is s/p TLIF L4-5 11/10. PMHx: anemia, anxiety, arthritis, bipolar 1, chiari malformation, CAD, fatty liver, GERD, HLD, HTN, IBS, GERD, MDD     Clinical Impressions Kayla Holden was evaluated s/p the above admission list. She is indep and lives in an apt at baseline, she reports having very limited support available at discharge. Upon evaluation the pt was limited by surgical pain, migraine pain, spinal precautions, general debility and poor activity tolerance. Overall she needed CGA for general mobility and LB ADLs with increased time and cues for compensatory techniques. Pt will benefit from continued acute OT services to discharge home without follow up OT needed.       If plan is discharge home, recommend the following:   Assistance with cooking/housework;Assist for transportation     Functional Status Assessment   Patient has had a recent decline in their functional status and demonstrates the ability to make significant improvements in function in a reasonable and predictable amount of time.     Equipment Recommendations   None recommended by OT      Precautions/Restrictions   Precautions Precautions: Fall;Back Precaution Booklet Issued: Yes (comment) Recall of Precautions/Restrictions: Intact Required Braces or Orthoses: Spinal Brace Spinal Brace: Lumbar corset;Applied in sitting position Restrictions Weight Bearing Restrictions Per Provider Order: No     Mobility Bed Mobility Overal bed mobility: Needs Assistance Bed Mobility: Rolling, Sidelying to Sit, Sit to Sidelying Rolling: Supervision Sidelying to sit: Supervision     Sit to sidelying: Supervision General bed mobility comments: increased time and cues    Transfers Overall transfer level: Needs assistance    Transfers: Sit to/from Stand Sit to Stand: Supervision                  Balance Overall balance assessment: Needs assistance Sitting-balance support: Feet supported Sitting balance-Leahy Scale: Good     Standing balance support: No upper extremity supported, During functional activity Standing balance-Leahy Scale: Fair                             ADL either performed or assessed with clinical judgement   ADL Overall ADL's : Needs assistance/impaired Eating/Feeding: Independent   Grooming: Set up;Sitting   Upper Body Bathing: Set up;Sitting   Lower Body Bathing: Contact guard assist;Sit to/from stand   Upper Body Dressing : Set up;Cueing for UE precautions   Lower Body Dressing: Contact guard assist;Sit to/from stand Lower Body Dressing Details (indicate cue type and reason): pt is able to get into figure 4 position with increased time and effort Toilet Transfer: Supervision/safety;Ambulation   Toileting- Clothing Manipulation and Hygiene: Supervision/safety;Sitting/lateral lean       Functional mobility during ADLs: Supervision/safety General ADL Comments: limited by head ache, pain, weakenss, general debility     Vision Baseline Vision/History: 0 No visual deficits Vision Assessment?: No apparent visual deficits     Perception Perception: Within Functional Limits       Praxis Praxis: WFL       Pertinent Vitals/Pain Pain Assessment Pain Assessment: Faces Faces Pain Scale: Hurts even more Pain Location: migraine and back pain Pain Descriptors / Indicators: Discomfort, Guarding Pain Intervention(s): Limited activity within patient's tolerance, Monitored during session     Extremity/Trunk Assessment Upper Extremity Assessment Upper Extremity Assessment: Generalized weakness  Lower Extremity Assessment Lower Extremity Assessment: Defer to PT evaluation   Cervical / Trunk Assessment Cervical / Trunk Assessment: Back Surgery    Communication Communication Communication: No apparent difficulties   Cognition Arousal: Lethargic Behavior During Therapy: WFL for tasks assessed/performed, Flat affect Cognition: No apparent impairments                               Following commands: Intact       Cueing  General Comments   Cueing Techniques: Verbal cues  VSS on RA   Exercises     Shoulder Instructions      Home Living Family/patient expects to be discharged to:: Private residence Living Arrangements: Alone (fiance is there on the weekends) Available Help at Discharge: Family;Friend(s);Available PRN/intermittently Type of Home: Apartment Home Access: Stairs to enter Entrance Stairs-Number of Steps: 2+1   Home Layout: One level     Bathroom Shower/Tub: Chief Strategy Officer: Standard                Prior Functioning/Environment Prior Level of Function : Independent/Modified Independent                    OT Problem List: Decreased strength;Decreased range of motion;Decreased activity tolerance;Impaired balance (sitting and/or standing);Decreased safety awareness;Decreased knowledge of precautions;Decreased knowledge of use of DME or AE;Pain   OT Treatment/Interventions: Self-care/ADL training;Therapeutic exercise;DME and/or AE instruction;Therapeutic activities;Patient/family education;Balance training      OT Goals(Current goals can be found in the care plan section)   Acute Rehab OT Goals Patient Stated Goal: less pain OT Goal Formulation: With patient/family Time For Goal Achievement: 07/12/24 Potential to Achieve Goals: Good ADL Goals Additional ADL Goal #1: Pt will complete all ADLs with mod I while maintaining spinal precautions   OT Frequency:  Min 2X/week    Co-evaluation              AM-PAC OT 6 Clicks Daily Activity     Outcome Measure Help from another person eating meals?: None Help from another person taking care of  personal grooming?: A Little Help from another person toileting, which includes using toliet, bedpan, or urinal?: A Little Help from another person bathing (including washing, rinsing, drying)?: A Little Help from another person to put on and taking off regular upper body clothing?: A Little Help from another person to put on and taking off regular lower body clothing?: A Little 6 Click Score: 19   End of Session Equipment Utilized During Treatment: Back brace Nurse Communication: Mobility status  Activity Tolerance: Patient limited by pain Patient left: in bed;with call bell/phone within reach  OT Visit Diagnosis: Unsteadiness on feet (R26.81);Other abnormalities of gait and mobility (R26.89);Muscle weakness (generalized) (M62.81);Pain                Time: 9160-9143 OT Time Calculation (min): 17 min Charges:  OT General Charges $OT Visit: 1 Visit OT Evaluation $OT Eval Moderate Complexity: 1 Mod  Lucie Kendall, OTR/L Acute Rehabilitation Services Office [phone] Secure Chat Communication Preferred   Lucie JONETTA Kendall 06/28/2024, 9:38 AM

## 2024-06-28 NOTE — Evaluation (Signed)
 Physical Therapy Evaluation  Patient Details Name: Kayla Holden MRN: [id number] DOB: 08/02/1981 Today's Date: 06/28/2024  History of Present Illness  Kayla Holden  43 y.o. female who is s/p TLIF L4-5 11/10. PMHx: anemia, anxiety, arthritis, bipolar 1, chiari malformation, CAD, HTN, IBS, GERD, MDD  Clinical Impression  Pt admitted with above diagnosis. At the time of PT eval, pt was able to demonstrate transfers and ambulation with gross supervision for safety to modified independence and no AD. Pt was educated on precautions, brace application/wearing schedule, appropriate activity progression, and car transfer. Pt currently with functional limitations due to the deficits listed below (see PT Problem List). Pt will benefit from skilled PT to increase their independence and safety with mobility to allow discharge to the venue listed below.          If plan is discharge home, recommend the following: A little help with walking and/or transfers;A little help with bathing/dressing/bathroom;Assistance with cooking/housework;Assist for transportation;Help with stairs or ramp for entrance   Can travel by private vehicle        Equipment Recommendations None recommended by PT  Recommendations for Other Services       Functional Status Assessment Patient has had a recent decline in their functional status and demonstrates the ability to make significant improvements in function in a reasonable and predictable amount of time.     Precautions / Restrictions Precautions Precautions: Fall;Back Precaution Booklet Issued: Yes (comment) Recall of Precautions/Restrictions: Intact Precaution/Restrictions Comments: Reviewed handout and pt was cued for precautions during functional mobility. Required Braces or Orthoses: Spinal Brace Spinal Brace: Lumbar corset;Applied in sitting position Restrictions Weight Bearing Restrictions Per Provider Order: No      Mobility  Bed Mobility Overal  bed mobility: Needs Assistance Bed Mobility: Rolling, Sidelying to Sit, Sit to Sidelying Rolling: Modified independent (Device/Increase time) Sidelying to sit: Supervision     Sit to sidelying: Supervision General bed mobility comments: HOB flat with use of rails. Pt was able to demonstrate log roll without assist. Reports has been sleeping in the recliner or on the couch at home and plans to return to this at d/c    Transfers Overall transfer level: Modified independent Equipment used: None Transfers: Sit to/from Stand             General transfer comment: No assist to transition to/from EOB. Increased time due to pain.    Ambulation/Gait Ambulation/Gait assistance: Modified independent (Device/Increase time) Gait Distance (Feet): 300 Feet Assistive device: None Gait Pattern/deviations: Step-through pattern, Decreased stride length, Trunk flexed, Wide base of support Gait velocity: Decreased Gait velocity interpretation: 1.31 - 2.62 ft/sec, indicative of limited community ambulator   General Gait Details: Pt up in the bathroom when PT arrived. Able to ambulate around room and down the hall without assistance. No gross unsteadiness or LOB noted.  Stairs            Wheelchair Mobility     Tilt Bed    Modified Rankin (Stroke Patients Only)       Balance Overall balance assessment: Needs assistance Sitting-balance support: Feet supported Sitting balance-Leahy Scale: Good     Standing balance support: No upper extremity supported, During functional activity Standing balance-Leahy Scale: Fair                               Pertinent Vitals/Pain Pain Assessment Pain Assessment: Faces Faces Pain Scale: Hurts even more Pain Location: migraine and  back pain Pain Descriptors / Indicators: Discomfort, Guarding Pain Intervention(s): Limited activity within patient's tolerance, Monitored during session, Repositioned    Home Living Family/patient  expects to be discharged to:: Private residence Living Arrangements: Alone (fiance is there on the weekends) Available Help at Discharge: Family;Friend(s);Available PRN/intermittently Type of Home: Apartment Home Access: Stairs to enter   Entrance Stairs-Number of Steps: 2+1   Home Layout: One level        Prior Function Prior Level of Function : Independent/Modified Independent                     Extremity/Trunk Assessment   Upper Extremity Assessment Upper Extremity Assessment: Defer to OT evaluation    Lower Extremity Assessment Lower Extremity Assessment: Generalized weakness (mild; consistent with pre-op diagnosis)    Cervical / Trunk Assessment Cervical / Trunk Assessment: Back Surgery  Communication   Communication Communication: No apparent difficulties    Cognition Arousal: Lethargic Behavior During Therapy: WFL for tasks assessed/performed, Flat affect   PT - Cognitive impairments: No apparent impairments                         Following commands: Intact       Cueing Cueing Techniques: Verbal cues, Gestural cues     General Comments      Exercises     Assessment/Plan    PT Assessment Patient needs continued PT services  PT Problem List Decreased strength;Decreased activity tolerance;Decreased balance;Decreased mobility;Decreased knowledge of use of DME;Decreased safety awareness;Decreased knowledge of precautions;Pain       PT Treatment Interventions DME instruction;Gait training;Functional mobility training;Stair training;Therapeutic activities;Therapeutic exercise;Balance training;Patient/family education    PT Goals (Current goals can be found in the Care Plan section)  Acute Rehab PT Goals Patient Stated Goal: Be able to manage at home with limited assistance PT Goal Formulation: With patient Time For Goal Achievement: 07/05/24 Potential to Achieve Goals: Good    Frequency Min 5X/week     Co-evaluation                AM-PAC PT 6 Clicks Mobility  Outcome Measure Help needed turning from your back to your side while in a flat bed without using bedrails?: None Help needed moving from lying on your back to sitting on the side of a flat bed without using bedrails?: A Little Help needed moving to and from a bed to a chair (including a wheelchair)?: A Little Help needed standing up from a chair using your arms (e.g., wheelchair or bedside chair)?: A Little Help needed to walk in hospital room?: A Little Help needed climbing 3-5 steps with a railing? : A Little 6 Click Score: 19    End of Session Equipment Utilized During Treatment: Gait belt;Back brace Activity Tolerance: Patient tolerated treatment well Patient left: in bed;with call bell/phone within reach Nurse Communication: Mobility status PT Visit Diagnosis: Unsteadiness on feet (R26.81);Pain Pain - part of body:  (back)    Time: 8794-8765 PT Time Calculation (min) (ACUTE ONLY): 29 min   Charges:   PT Evaluation $PT Eval Low Complexity: 1 Low PT Treatments $Gait Training: 8-22 mins PT General Charges $$ ACUTE PT VISIT: 1 Visit         Leita Sable, PT, DPT Acute Rehabilitation Services Secure Chat Preferred Office: [phone]   Leita JONETTA Sable 06/28/2024, 1:17 PM

## 2024-06-28 NOTE — Progress Notes (Signed)
    Subjective: Procedure(s) (LRB): TRANSFORAMINAL LUMBAR INTERBODY FUSION OF LUMBAR FOUR TO LUMBAR FIVE (N/A) 1 Day Post-Op  Patient reports pain as severe.  Reports decreased leg pain reports incisional back pain   Positive void Negative bowel movement Negative flatus Negative chest pain or shortness of breath  Objective: Vital signs in last 24 hours: Temp:  [97.9 F (36.6 C)-99.4 F (37.4 C)] 99.2 F (37.3 C) (11/10 2226) Pulse Rate:  [81-107] 81 (11/10 2226) Resp:  [7-20] 16 (11/10 2226) BP: (99-137)/(66-92) 117/70 (11/10 2226) SpO2:  [91 %-100 %] 100 % (11/10 2226)  Intake/Output from previous day: 11/10 0701 - 11/11 0700 In: 2670 [P.O.:720; I.V.:1850; IV Piggyback:100] Out: 525 [Urine:275; Blood:250]  Labs: No results for input(s): WBC, RBC, HCT, PLT in the last 72 hours. No results for input(s): NA, K, CL, CO2, BUN, CREATININE, GLUCOSE, CALCIUM  in the last 72 hours. No results for input(s): LABPT, INR in the last 72 hours.  Physical Exam: Sensation intact distally Intact pulses distally Dorsiflexion/Plantar flexion intact Incision: dressing C/D/I No cellulitis present Compartment soft Body mass index is 41.63 kg/m.   Assessment/Plan: Nadyne is a very pleasant 43 year old female who is POD1 from TLIF L4-L5. Surgical intervention was successful and without complications. Her hospital course has been uncomplicated. She states that her pre-operative leg pain is much improved since surgery. She is ambulating on his own. She is tolerating oral intake well. She is compliant with the LSO brace. She is complaint with the incentive spirometer. She reports that her pain is mildly controlled with the oral pain medications.Dressing is c/d/I.     Continue Care Continue pain medical management  Continue Incentive spirometry  Continue mobilization with PT Plan to d/c to home tomorrow if pain is well controlled and cleared by PT and positive  flatus     Jeoffrey Sages PA-C for Dr. Donaciano Sprang, MD [facility] [phone]

## 2024-06-29 NOTE — Progress Notes (Signed)
 PT Cancellation Note  Patient Details Name: Kayla Holden MRN: [id number] DOB: 14-Apr-1981   Cancelled Treatment:    Reason Eval/Treat Not Completed: Other (comment) Pt received ambulating independently in hallway; previously worked with OT this morning and reports no further questions/concerns. No further acute or follow up PT needs per evaluation note, 11/11. Thank you for this consult.  Aleck Daring, PT, DPT Acute Rehabilitation Services Office [phone]    [name] 06/29/2024, 8:58 AM

## 2024-06-29 NOTE — Progress Notes (Signed)
 Occupational Therapy Treatment Patient Details Name: Kayla Holden MRN: [id number] DOB: September 28, 1980 Today's Date: 06/29/2024   History of present illness Kayla Holden  43 y.o. female who is s/p TLIF L4-5 11/10. PMHx: anemia, anxiety, arthritis, bipolar 1, chiari malformation, CAD, HTN, IBS, GERD, MDD   OT comments  Patient up ambulating in room without back brace upon entry.  Patient was instructed to wear back brace whenever up.  Patient able to perform dressing seated/standing from EOB with supervision and AE use for LB dressing with reacher and sock aide due to difficulty with figure 4 position. Patient instructed on toilet aide to perform toilet hygiene and maintain back precautions. Discharge recommendations continue to be appropriate.       If plan is discharge home, recommend the following:  Assistance with cooking/housework;Assist for transportation   Equipment Recommendations  None recommended by OT    Recommendations for Other Services      Precautions / Restrictions Precautions Precautions: Fall;Back Precaution Booklet Issued: Yes (comment) Recall of Precautions/Restrictions: Intact Precaution/Restrictions Comments: Reviewed precautions Required Braces or Orthoses: Spinal Brace Spinal Brace: Lumbar corset;Applied in sitting position Restrictions Weight Bearing Restrictions Per Provider Order: No       Mobility Bed Mobility Overal bed mobility: Needs Assistance             General bed mobility comments: OOB upon entry    Transfers Overall transfer level: Modified independent Equipment used: None               General transfer comment: ambulating in room upon entry without back brace, instructed patient to wear back brace whenever up     Balance Overall balance assessment: Needs assistance Sitting-balance support: Feet supported Sitting balance-Leahy Scale: Good     Standing balance support: No upper extremity supported, During functional  activity Standing balance-Leahy Scale: Fair                             ADL either performed or assessed with clinical judgement   ADL Overall ADL's : Needs assistance/impaired       Grooming Details (indicate cue type and reason): Patient stated she performed earlier         Upper Body Dressing : Supervision/safety;Cueing for UE precautions Upper Body Dressing Details (indicate cue type and reason): donned bra, top, and back brace Lower Body Dressing: Supervision/safety;Cueing for compensatory techniques;Cueing for back precautions;With adaptive equipment;Sit to/from stand Lower Body Dressing Details (indicate cue type and reason): difficutly performing figure 4 for sock and underwear, education on reacher and sock aide use       Toileting - Clothing Manipulation Details (indicate cue type and reason): education on toilet aide to assist with toilet hygiene and maintaining back precautions     Functional mobility during ADLs: Supervision/safety General ADL Comments: instructions on donning back brace before performing mobility in room    Extremity/Trunk Assessment              Vision       Perception     Praxis     Communication Communication Communication: No apparent difficulties   Cognition Arousal: Alert Behavior During Therapy: WFL for tasks assessed/performed, Flat affect Cognition: No apparent impairments                               Following commands: Intact        Cueing  Cueing Techniques: Verbal cues, Gestural cues  Exercises      Shoulder Instructions       General Comments VSS on RA    Pertinent Vitals/ Pain       Pain Assessment Pain Assessment: Faces Faces Pain Scale: Hurts even more Pain Location: back pain Pain Descriptors / Indicators: Discomfort, Guarding Pain Intervention(s): Limited activity within patient's tolerance, Monitored during session, Repositioned  Home Living                                           Prior Functioning/Environment              Frequency  Min 2X/week        Progress Toward Goals  OT Goals(current goals can now be found in the care plan section)  Progress towards OT goals: Progressing toward goals  Acute Rehab OT Goals Patient Stated Goal: to go home OT Goal Formulation: With patient Time For Goal Achievement: 07/12/24 Potential to Achieve Goals: Good ADL Goals Additional ADL Goal #1: Pt will complete all ADLs with mod I while maintaining spinal precautions  Plan      Co-evaluation                 AM-PAC OT 6 Clicks Daily Activity     Outcome Measure   Help from another person eating meals?: None Help from another person taking care of personal grooming?: A Little Help from another person toileting, which includes using toliet, bedpan, or urinal?: A Little Help from another person bathing (including washing, rinsing, drying)?: A Little Help from another person to put on and taking off regular upper body clothing?: A Little Help from another person to put on and taking off regular lower body clothing?: A Little 6 Click Score: 19    End of Session Equipment Utilized During Treatment: Back brace  OT Visit Diagnosis: Unsteadiness on feet (R26.81);Other abnormalities of gait and mobility (R26.89);Muscle weakness (generalized) (M62.81);Pain Pain - part of body:  (back)   Activity Tolerance Patient tolerated treatment well;Patient limited by pain   Patient Left in bed;with call bell/phone within reach (seated on EOB)   Nurse Communication Mobility status        Time: 9243-9181 OT Time Calculation (min): 22 min  Charges: OT General Charges $OT Visit: 1 Visit OT Treatments $Self Care/Home Management : 8-22 mins  Dick Laine, OTA Acute Rehabilitation Services  Office [phone]   Jeb LITTIE Laine 06/29/2024, 8:42 AM

## 2024-06-29 NOTE — Discharge Summary (Signed)
 Patient ID: [name] MRN: [id number] DOB/AGE: 45/06/1981 45 y.o.  Admit date: 06/27/2024 Discharge date: 06/29/2024  Admission Diagnoses:  Principal Problem:   S/P lumbar fusion   Discharge Diagnoses:  Principal Problem:   S/P lumbar fusion  status post Procedure(s): TRANSFORAMINAL LUMBAR INTERBODY FUSION OF LUMBAR FOUR TO LUMBAR FIVE  Past Medical History:  Diagnosis Date   Anemia    Anxiety    Anxiety    Arthritis    Bipolar 1 disorder (HCC)    CAD (coronary artery disease)    Chiari malformation    Colitis    Colon polyps    Complication of anesthesia    CYP2D6 extensive metabolizer    Depression    Endometriosis    Fall 02/04/2024   wound vac, skin ulcer of L knee with fat layer exposed. wound dehiscence.   Fatty liver    GERD (gastroesophageal reflux disease)    Hyperlipidemia    Hypertension    IBS (irritable bowel syndrome)    MDD (major depressive disorder)    Microscopic colitis    Migraine headache    Panic disorder with agoraphobia    PONV (postoperative nausea and vomiting)    Vitamin B 12 deficiency     Surgeries: Procedure(s): TRANSFORAMINAL LUMBAR INTERBODY FUSION OF LUMBAR FOUR TO LUMBAR FIVE on 06/27/2024   Consultants:   Discharged Condition: Improved  Hospital Course: Kayla Holden is an 43 y.o. female who was admitted 06/27/2024 for operative treatment of S/P lumbar fusion. Patient failed conservative treatments (please see the history and physical for the specifics) and had severe unremitting pain that affects sleep, daily activities and work/hobbies. After pre-op clearance, the patient was taken to the operating room on 06/27/2024 and underwent  Procedure(s): TRANSFORAMINAL LUMBAR INTERBODY FUSION OF LUMBAR FOUR TO LUMBAR FIVE.    Patient was given perioperative antibiotics:  Anti-infectives (From admission, onward)    Start     Dose/Rate Route Frequency Ordered Stop   06/27/24 1800  ceFAZolin  (ANCEF ) IVPB 1 g/50 mL  premix        1 g 100 mL/hr over 30 Minutes Intravenous Every 8 hours 06/27/24 1500 06/28/24 1002   06/27/24 1142  vancomycin  (VANCOCIN ) powder  Status:  Discontinued          As needed 06/27/24 1142 06/27/24 1228   06/27/24 0605  ceFAZolin  (ANCEF ) IVPB 2g/100 mL premix        2 g 200 mL/hr over 30 Minutes Intravenous 30 min pre-op 06/27/24 9394 06/27/24 1135        Patient was given sequential compression devices and early ambulation to prevent DVT.   Patient benefited maximally from hospital stay and there were no complications. At the time of discharge, the patient was urinating/moving their bowels without difficulty, tolerating a regular diet, pain is controlled with oral pain medications and they have been cleared by PT/OT.   Recent vital signs: Patient Vitals for the past 24 hrs:  BP Temp Temp src Pulse Resp SpO2  06/29/24 0600 107/65 99.2 F (37.3 C) Oral 88 18 98 %  06/28/24 2353 116/71 98.6 F (37 C) Oral 87 18 97 %  06/28/24 1945 112/77 98.8 F (37.1 C) Oral 100 16 99 %  06/28/24 1533 112/67 -- -- (!) 101 18 100 %  06/28/24 0746 114/76 98.6 F (37 C) Oral 89 17 99 %     Recent laboratory studies: No results for input(s): WBC, HGB, HCT, PLT, NA, K, CL, CO2, BUN, CREATININE, GLUCOSE,  INR, CALCIUM  in the last 72 hours.  Invalid input(s): PT, 2   Discharge Medications:   Allergies as of 06/29/2024       Reactions   Codeine Other (See Comments)   Patient has CYP2D6, so NO CODEINE   Fioricet-codeine [butalbital-apap-caff-cod] Other (See Comments)   Patient has CYP2D6 and cannot tolerate codeine, palpitations, rapid heart rate   Isoniazid Shortness Of Breath   Ketorolac  Tromethamine  Other (See Comments)   Abdominal Pain if given PO. It has to be given IV   Rifampin Shortness Of Breath, Hives   Tape Other (See Comments)   Paper tape causes blisters   Clindamycin Diarrhea, Other (See Comments)   Abdominal pain, also   Ketoprofen  Other (See Comments)   Abdominal pain and cannot take due to history of colitis   Nsaids Other (See Comments)   History of colitis        Medication List     STOP taking these medications    cyanocobalamin  1000 MCG tablet   HYDROcodone-acetaminophen  10-325 MG tablet Commonly known as: NORCO   ondansetron  8 MG disintegrating tablet Commonly known as: ZOFRAN -ODT   vitamin D3 25 MCG tablet Commonly known as: CHOLECALCIFEROL        TAKE these medications    albuterol 108 (90 Base) MCG/ACT inhaler Commonly known as: VENTOLIN HFA Inhale 2 puffs into the lungs every 6 (six) hours as needed for shortness of breath or wheezing.   Belsomra 10 MG Tabs Generic drug: Suvorexant Take 10 mg by mouth daily.   clonazePAM 2 MG tablet Commonly known as: KLONOPIN Take 2 mg by mouth in the morning, at noon, and at bedtime.   dicyclomine  20 MG tablet Commonly known as: BENTYL  Take 2 tablets (40 mg total) by mouth 4 (four) times daily as needed for spasms.   diphenoxylate -atropine  2.5-0.025 MG tablet Commonly known as: LOMOTIL  Take 1 tablet by mouth daily.   dronabinol  5 MG capsule Commonly known as: MARINOL  Take 1 capsule (5 mg total) by mouth 2 (two) times daily. What changed:  when to take this reasons to take this   drospirenone -ethinyl estradiol  3-0.02 MG tablet Commonly known as: YAZ Take 1 tablet by mouth daily with lunch.   famotidine  40 MG tablet Commonly known as: PEPCID  TAKE ONE TABLET BY MOUTH AT BEDTIME   fluticasone-salmeterol 45-21 MCG/ACT inhaler Commonly known as: ADVAIR HFA Inhale 2 puffs into the lungs 2 (two) times daily.   hydrOXYzine  50 MG tablet Commonly known as: ATARAX  Take 50 mg by mouth every 6 (six) hours as needed for anxiety.   Lurasidone  HCl 120 MG Tabs Take 120 mg by mouth daily.   methocarbamol  500 MG tablet Commonly known as: ROBAXIN  Take 1 tablet (500 mg total) by mouth every 8 (eight) hours as needed for up to 5 days for muscle  spasms.   ondansetron  4 MG tablet Commonly known as: Zofran  Take 1 tablet (4 mg total) by mouth every 8 (eight) hours as needed for nausea or vomiting.   oxyCODONE -acetaminophen  10-325 MG tablet Commonly known as: Percocet Take 1 tablet by mouth every 6 (six) hours as needed for up to 5 days for pain.   pantoprazole  40 MG tablet Commonly known as: PROTONIX  Take 2 tablets (80 mg total) by mouth 2 (two) times daily.   propranolol  20 MG tablet Commonly known as: INDERAL  Take 20 mg by mouth 3 (three) times daily. What changed: Another medication with the same name was removed. Continue taking this medication, and follow the  directions you see here.   rizatriptan  10 MG disintegrating tablet Commonly known as: MAXALT -MLT Take 10 mg by mouth as needed for migraine.   rosuvastatin  10 MG tablet Commonly known as: CRESTOR  Take 1 tablet (10 mg total) by mouth at bedtime.   spironolactone  100 MG tablet Commonly known as: ALDACTONE  Take 1 tablet (100 mg total) by mouth daily at 12 noon.   torsemide 20 MG tablet Commonly known as: DEMADEX Take 20 mg by mouth 2 (two) times daily as needed (fluid).   traZODone  100 MG tablet Commonly known as: DESYREL  Take 100 mg by mouth at bedtime.   zolpidem  12.5 MG CR tablet Commonly known as: AMBIEN  CR Take 1 tablet (12.5 mg total) by mouth at bedtime.        Diagnostic Studies: DG Lumbar Spine 2-3 Views Result Date: 06/27/2024 CLINICAL DATA:  461500 Elective surgery 461500 EXAM: LUMBAR SPINE - 2-3 VIEW COMPARISON:  July 23, 2023 FINDINGS: Spot fluoroscopy images were obtained for surgical planning purposes. This demonstrates patient is status post posterior fixation with intervertebral spacer placement of L4-5 Time: 172.1 seconds Dose: 196.26 mGy Please reference procedure report for further details. IMPRESSION: Spot fluoroscopy images obtained for surgical planning purposes. Electronically Signed   By: Corean Salter M.D.   On:  06/27/2024 12:51   DG C-Arm 1-60 Min-No Report Result Date: 06/27/2024 Fluoroscopy was utilized by the requesting physician.  No radiographic interpretation.   DG C-Arm 1-60 Min-No Report Result Date: 06/27/2024 Fluoroscopy was utilized by the requesting physician.  No radiographic interpretation.   DG C-Arm 1-60 Min-No Report Result Date: 06/27/2024 Fluoroscopy was utilized by the requesting physician.  No radiographic interpretation.   DG C-Arm 1-60 Min-No Report Result Date: 06/27/2024 Fluoroscopy was utilized by the requesting physician.  No radiographic interpretation.       Follow-up Information     Burnetta Aures, MD. Schedule an appointment as soon as possible for a visit in 2 week(s).   Specialty: Orthopedic Surgery Why: If symptoms worsen, For suture removal, For wound re-check Contact information: [address] [place] [address] [phone]                 Discharge Plan:  discharge to home today   Disposition:  Kayla Holden is a very pleasant 43 year old female who is POD1 from TLIF L4-L5. Surgical intervention was successful and without complications. Her hospital course has been uncomplicated. She states that her pre-operative leg pain is much improved since surgery. She is ambulating on his own. She is tolerating oral intake well. She is compliant with the LSO brace. She is complaint with the incentive spirometer. She reports that her pain is mildly controlled with the oral pain medications.Dressing is c/d/I.  Patient will follow up with us  in 2 weeks for her first post-op visit. Patient understands that she can shower after POD 5. All questions welcomed and addressed.    Signed: Jeoffrey LOISE Sages PA-C for Dr. Aures [facility] [phone] 06/29/2024, 7:30 AM

## 2024-06-29 NOTE — Plan of Care (Signed)
 Problem: Education: Goal: Knowledge of General Education information will improve Description: Including pain rating scale, medication(s)/side effects and non-pharmacologic comfort measures 06/29/2024 1111 by Sherleen Flor, RN Outcome: Completed/Met 06/29/2024 1110 by Sherleen Flor, RN Outcome: Progressing   Problem: Health Behavior/Discharge Planning: Goal: Ability to manage health-related needs will improve 06/29/2024 1111 by Sherleen Flor, RN Outcome: Completed/Met 06/29/2024 1110 by Sherleen Flor, RN Outcome: Progressing   Problem: Clinical Measurements: Goal: Ability to maintain clinical measurements within normal limits will improve 06/29/2024 1111 by Sherleen Flor, RN Outcome: Completed/Met 06/29/2024 1110 by Sherleen Flor, RN Outcome: Progressing Goal: Will remain free from infection 06/29/2024 1111 by Sherleen Flor, RN Outcome: Completed/Met 06/29/2024 1110 by Sherleen Flor, RN Outcome: Progressing Goal: Diagnostic test results will improve 06/29/2024 1111 by Sherleen Flor, RN Outcome: Completed/Met 06/29/2024 1110 by Sherleen Flor, RN Outcome: Progressing Goal: Respiratory complications will improve 06/29/2024 1111 by Sherleen Flor, RN Outcome: Completed/Met 06/29/2024 1110 by Sherleen Flor, RN Outcome: Progressing Goal: Cardiovascular complication will be avoided 06/29/2024 1111 by Sherleen Flor, RN Outcome: Completed/Met 06/29/2024 1110 by Sherleen Flor, RN Outcome: Progressing   Problem: Activity: Goal: Risk for activity intolerance will decrease 06/29/2024 1111 by Sherleen Flor, RN Outcome: Completed/Met 06/29/2024 1110 by Sherleen Flor, RN Outcome: Progressing   Problem: Nutrition: Goal: Adequate nutrition will be maintained 06/29/2024 1111 by Sherleen Flor, RN Outcome: Completed/Met 06/29/2024 1110 by Sherleen Flor, RN Outcome: Progressing   Problem: Coping: Goal: Level of anxiety will  decrease 06/29/2024 1111 by Sherleen Flor, RN Outcome: Completed/Met 06/29/2024 1110 by Sherleen Flor, RN Outcome: Progressing   Problem: Elimination: Goal: Will not experience complications related to bowel motility 06/29/2024 1111 by Sherleen Flor, RN Outcome: Completed/Met 06/29/2024 1110 by Sherleen Flor, RN Outcome: Progressing Goal: Will not experience complications related to urinary retention 06/29/2024 1111 by Sherleen Flor, RN Outcome: Completed/Met 06/29/2024 1110 by Sherleen Flor, RN Outcome: Progressing   Problem: Pain Managment: Goal: General experience of comfort will improve and/or be controlled 06/29/2024 1111 by Sherleen Flor, RN Outcome: Completed/Met 06/29/2024 1110 by Sherleen Flor, RN Outcome: Progressing   Problem: Safety: Goal: Ability to remain free from injury will improve 06/29/2024 1111 by Sherleen Flor, RN Outcome: Completed/Met 06/29/2024 1110 by Sherleen Flor, RN Outcome: Progressing   Problem: Skin Integrity: Goal: Risk for impaired skin integrity will decrease 06/29/2024 1111 by Sherleen Flor, RN Outcome: Completed/Met 06/29/2024 1110 by Sherleen Flor, RN Outcome: Progressing   Problem: Education: Goal: Ability to verbalize activity precautions or restrictions will improve 06/29/2024 1111 by Sherleen Flor, RN Outcome: Completed/Met 06/29/2024 1110 by Sherleen Flor, RN Outcome: Progressing Goal: Knowledge of the prescribed therapeutic regimen will improve 06/29/2024 1111 by Sherleen Flor, RN Outcome: Completed/Met 06/29/2024 1110 by Sherleen Flor, RN Outcome: Progressing Goal: Understanding of discharge needs will improve 06/29/2024 1111 by Sherleen Flor, RN Outcome: Completed/Met 06/29/2024 1110 by Sherleen Flor, RN Outcome: Progressing   Problem: Activity: Goal: Ability to avoid complications of mobility impairment will improve 06/29/2024 1111 by Sherleen Flor,  RN Outcome: Completed/Met 06/29/2024 1110 by Sherleen Flor, RN Outcome: Progressing Goal: Ability to tolerate increased activity will improve 06/29/2024 1111 by Sherleen Flor, RN Outcome: Completed/Met 06/29/2024 1110 by Sherleen Flor, RN Outcome: Progressing Goal: Will remain free from falls 06/29/2024 1111 by Sherleen Flor, RN Outcome: Completed/Met 06/29/2024 1110 by Sherleen Flor, RN Outcome: Progressing   Problem: Bowel/Gastric: Goal: Gastrointestinal status for postoperative course will improve 06/29/2024 1111 by Sherleen Flor, RN Outcome: Completed/Met 06/29/2024 1110 by Sherleen Flor, RN Outcome: Progressing   Problem: Clinical Measurements: Goal: Ability to  maintain clinical measurements within normal limits will improve 06/29/2024 1111 by Sherleen Flor, RN Outcome: Completed/Met 06/29/2024 1110 by Sherleen Flor, RN Outcome: Progressing Goal: Postoperative complications will be avoided or minimized 06/29/2024 1111 by Sherleen Flor, RN Outcome: Completed/Met 06/29/2024 1110 by Sherleen Flor, RN Outcome: Progressing Goal: Diagnostic test results will improve 06/29/2024 1111 by Sherleen Flor, RN Outcome: Completed/Met 06/29/2024 1110 by Sherleen Flor, RN Outcome: Progressing   Problem: Pain Management: Goal: Pain level will decrease 06/29/2024 1111 by Sherleen Flor, RN Outcome: Completed/Met 06/29/2024 1110 by Sherleen Flor, RN Outcome: Progressing   Problem: Skin Integrity: Goal: Will show signs of wound healing 06/29/2024 1111 by Sherleen Flor, RN Outcome: Completed/Met 06/29/2024 1110 by Sherleen Flor, RN Outcome: Progressing   Problem: Health Behavior/Discharge Planning: Goal: Identification of resources available to assist in meeting health care needs will improve 06/29/2024 1111 by Sherleen Flor, RN Outcome: Completed/Met 06/29/2024 1110 by Sherleen Flor, RN Outcome: Progressing   Problem:  Bladder/Genitourinary: Goal: Urinary functional status for postoperative course will improve 06/29/2024 1111 by Sherleen Flor, RN Outcome: Completed/Met 06/29/2024 1110 by Sherleen Flor, RN Outcome: Progressing

## 2024-06-29 NOTE — Anesthesia Postprocedure Evaluation (Signed)
 Anesthesia Post Note  Patient: Kayla Holden  Procedure(s) Performed: TRANSFORAMINAL LUMBAR INTERBODY FUSION OF LUMBAR FOUR TO LUMBAR FIVE (Spine Lumbar)     Patient location during evaluation: PACU Anesthesia Type: General Level of consciousness: awake and alert Pain management: pain level controlled Vital Signs Assessment: post-procedure vital signs reviewed and stable Respiratory status: spontaneous breathing, nonlabored ventilation, respiratory function stable and patient connected to nasal cannula oxygen Cardiovascular status: blood pressure returned to baseline and stable Postop Assessment: no apparent nausea or vomiting Anesthetic complications: no   No notable events documented.              Lurdes Haltiwanger D Edrie Ehrich

## 2024-06-29 NOTE — Progress Notes (Signed)
 Patient alert and oriented, mae's well, voiding adequate amount of urine, swallowing without difficulty, no c/o pain at time of discharge. Patient discharged home with family. Script and discharged instructions given to patient. Patient and family stated understanding of instructions given. Patient has an appointment with Dr.Brooks In 2 weeks. Patient waiting for family for ride home

## 2024-07-20 DIAGNOSIS — L97822 Non-pressure chronic ulcer of other part of left lower leg with fat layer exposed: Secondary | ICD-10-CM | POA: Diagnosis not present

## 2024-08-09 ENCOUNTER — Other Ambulatory Visit: Payer: Self-pay

## 2024-08-09 ENCOUNTER — Telehealth: Payer: Self-pay | Admitting: Internal Medicine

## 2024-08-09 MED ORDER — DIPHENOXYLATE-ATROPINE 2.5-0.025 MG PO TABS: 1.0000 | ORAL_TABLET | Freq: Every day | ORAL | 4 refills | Status: AC

## 2024-08-09 MED ORDER — DICYCLOMINE HCL 20 MG PO TABS: 40.0000 mg | ORAL_TABLET | Freq: Four times a day (QID) | ORAL | 4 refills | Status: AC | PRN

## 2024-08-09 NOTE — Telephone Encounter (Signed)
 Pt requesting refill on bentyl  and lomotil , please advise.

## 2024-08-09 NOTE — Telephone Encounter (Signed)
Refills sent in as requested. 

## 2024-08-09 NOTE — Telephone Encounter (Signed)
 Spoke w pt about getting two refills.  Pt stated she is in need of diphenoxylate -atropine  (LOMOTIL )  and  dicyclomine  (BENTYL ). Please advise thank you.

## 2024-09-05 ENCOUNTER — Ambulatory Visit: Admitting: Orthopedic Surgery

## 2024-09-05 ENCOUNTER — Encounter: Payer: Self-pay | Admitting: Orthopedic Surgery

## 2024-09-05 DIAGNOSIS — L02416 Cutaneous abscess of left lower limb: Secondary | ICD-10-CM | POA: Diagnosis not present

## 2024-09-05 NOTE — Progress Notes (Signed)
 "  Office Visit Note   Patient: Kayla Holden           Date of Birth: 1981/01/26           MRN: [id number] Visit Date: 09/05/2024              Requested by: Ina Marcellus RAMAN, MD [address] [place] [id number],  PCP: Ina Marcellus RAMAN, MD  Chief Complaint  Patient presents with   Left Knee - Pain      HPI: Discussed the use of AI scribe software for clinical note transcription with the patient, who gave verbal consent to proceed.  History of Present Illness Kayla Holden is a 44 year old female with a chronic non-healing ulcer of the right anterior knee who presents for surgical evaluation.  She sustained a fall on June 19th of the previous year, resulting in a wound over the anterior aspect of the right knee, specifically over the patellar tendon. The majority of the wound has healed; however, a persistent ulcer with a small draining sinus remains. The ulcer has been present for approximately seven months and has not resolved despite ongoing care.  She has received extensive wound care, including wound vacuum-assisted closure therapy and application of Prisma at the wound care center in [place]. Despite these interventions, the ulcer continues to drain and has failed to heal.  She denies significant pain at the wound site and is able to tolerate local wound care. No systemic symptoms such as fever or chills are reported. She is currently keeping the wound covered with a Band-Aid and allowing it to drain until surgical intervention.     Assessment & Plan: Visit Diagnoses:  1. Abscess of skin of left knee     Plan: Assessment and Plan Assessment & Plan Chronic infected anterior knee ulcer Chronic non-healing ulcer with tunneling and drainage, indicating chronic infection likely due to retained infected tissue. Surgical intervention indicated. - Planned surgical excision of non-viable and infected soft tissue from the anterior knee ulcer. - Planned intraoperative  wound cultures to identify causative organisms. - Instructed her to keep the wound covered and allow drainage until surgery.  Planned surgical excision and antibiotic therapy for chronic knee ulcer Surgical excision and closure recommended due to failed conservative management. Goal is removal of infected tissue, obtaining cultures, and targeted antibiotic therapy. Anesthesia options to be determined by anesthesia team. Anticipated outcome is wound healing post-removal of infected tissue and appropriate antibiotic coverage. - Scheduled surgical excision and wound closure for Friday at [facility]. - Planned placement of local antibiotics at the time of surgery. - Planned initiation of oral antibiotics postoperatively based on culture sensitivities. - Arranged for surgical scheduling and preoperative coordination.      Follow-Up Instructions: Return in about 2 weeks (around 09/19/2024).   Ortho Exam  Patient is alert, oriented, no adenopathy, well-dressed, normal affect, normal respiratory effort. Physical Exam CARDIOVASCULAR: Palpable dorsalis pedis pulse. MUSCULOSKELETAL: Ulcer over patellar tendon with tunneling.   Patient has pain-free range of motion left knee no effusion no signs of intra-articular pathology.  Ulcer is directly over the mid substance of the patella tendon with a large area of a transverse previous traumatic wound.   Imaging: No results found. No images are attached to the encounter.  Labs: Lab Results  Component Value Date   HGBA1C 5.9 (H) 03/10/2022   ESRSEDRATE 17 11/14/2021   ESRSEDRATE 13 10/28/2021   CRP 3.6 11/14/2021   CRP 0.5 10/28/2021  REPTSTATUS 11/02/2021 FINAL 10/28/2021   GRAMSTAIN  10/28/2021    FEW WBC PRESENT, PREDOMINANTLY MONONUCLEAR NO ORGANISMS SEEN    CULT  10/28/2021    No growth aerobically or anaerobically. Performed at [facility], [address] [place], [place] [address]      Lab Results  Component Value Date    ALBUMIN 3.2 (L) 06/23/2024   ALBUMIN 4.0 01/13/2024   ALBUMIN 3.8 03/10/2022    Lab Results  Component Value Date   MG 2.2 03/10/2022   No results found for: VD25OH  No results found for: PREALBUMIN    Latest Ref Rng & Units 06/23/2024   12:00 PM 01/13/2024   11:16 AM 03/10/2022    5:08 PM  CBC EXTENDED  WBC 4.0 - 10.5 K/uL 4.7  6.8  7.4   RBC 3.87 - 5.11 MIL/uL 4.34  4.52  4.18   Hemoglobin 12.0 - 15.0 g/dL 88.8  87.7  87.5   HCT 36.0 - 46.0 % 33.6  36.4  35.4   Platelets 150 - 400 K/uL 279  322.0  280   NEUT# 1.4 - 7.7 K/uL  3.5  3.5   Lymph# 0.7 - 4.0 K/uL  2.6  3.5      There is no height or weight on file to calculate BMI.  Orders:  No orders of the defined types were placed in this encounter.  No orders of the defined types were placed in this encounter.    Procedures: No procedures performed  Clinical Data: No additional findings.  ROS:  All other systems negative, except as noted in the HPI. Review of Systems  Objective: Vital Signs: There were no vitals taken for this visit.  Specialty Comments:  No specialty comments available.  PMFS History: Patient Active Problem List   Diagnosis Date Noted   S/P lumbar fusion 06/27/2024   Severe recurrent major depression without psychotic features (HCC) 03/11/2022   Hidradenitis suppurativa 03/11/2022   Gastric reflux 03/11/2022   MDD (major depressive disorder), recurrent severe, without psychosis (HCC) 03/10/2022   PTSD (post-traumatic stress disorder) 03/10/2022   Generalized anxiety disorder 03/10/2022   Septic arthritis of knee, left (HCC) 10/28/2021   Pain in joint, ankle and foot 07/09/2017   MDD (major depressive disorder) 07/06/2017   Lymphocytic colitis 08/22/2016   Chronic daily headache 05/09/2015   Past Medical History:  Diagnosis Date   Anemia    Anxiety    Anxiety    Arthritis    Bipolar 1 disorder (HCC)    CAD (coronary artery disease)    Chiari malformation    Colitis     Colon polyps    Complication of anesthesia    CYP2D6 extensive metabolizer    Depression    Endometriosis    Fall 02/04/2024   wound vac, skin ulcer of L knee with fat layer exposed. wound dehiscence.   Fatty liver    GERD (gastroesophageal reflux disease)    Hyperlipidemia    Hypertension    IBS (irritable bowel syndrome)    MDD (major depressive disorder)    Microscopic colitis    Migraine headache    Panic disorder with agoraphobia    PONV (postoperative nausea and vomiting)    Vitamin B 12 deficiency     Family History  Problem Relation Age of Onset   Macular degeneration Mother    Hypertension Father    Cancer Maternal Grandmother    Cancer Paternal Grandfather    Heart attack Paternal Grandfather  Allergic rhinitis Neg Hx    Angioedema Neg Hx    Asthma Neg Hx    Atopy Neg Hx    Eczema Neg Hx    Urticaria Neg Hx    Immunodeficiency Neg Hx    Migraines Neg Hx     Past Surgical History:  Procedure Laterality Date   APPENDECTOMY     BACK SURGERY  08/08/2023   CHOLECYSTECTOMY     INCISION AND DRAINAGE OF WOUND Left 10/28/2021   Procedure: KNEE ARTHROSCOPY WITH IRRIGATION AND DEBRIDEMENT;  Surgeon: Gerome Charleston, MD;  Location: [facility];  Service: Orthopedics;  Laterality: Left;  60 okay per OR   KNEE SURGERY Left    total of 6 times   MANDIBLE SURGERY     right foot arthrocentesis     Social History   Occupational History   Occupation: disabled  Tobacco Use   Smoking status: Former    Types: Cigarettes   Smokeless tobacco: Never   Tobacco comments:    Quit 2010  Vaping Use   Vaping status: Never Used  Substance and Sexual Activity   Alcohol use: Yes    Comment: social   Drug use: Not Currently    Types: Marijuana    Comment: since 2010 occ   Sexual activity: Not on file         "

## 2024-09-08 ENCOUNTER — Encounter (HOSPITAL_COMMUNITY): Payer: Self-pay | Admitting: Orthopedic Surgery

## 2024-09-08 ENCOUNTER — Other Ambulatory Visit (HOSPITAL_COMMUNITY): Payer: Self-pay

## 2024-09-08 NOTE — Progress Notes (Signed)
 SDW CALL  Patient was given pre-op instructions over the phone. The opportunity was given for the patient to ask questions. No further questions asked. Patient verbalized understanding of instructions given.   PCP -  Ina Marcellus RAMAN, MD  Cardiologist - denies Neurologist - Saima Athar,MD Pain management - Richard Ramos,MD  PPM/ICD - denies Device Orders -  Rep Notified -   Chest x-ray - na EKG - 06/23/24 Stress Test - denies ECHO - denies Cardiac Cath - denies  Sleep Study - 12/09/22-negative for sleep apnea CPAP - no  Fasting Blood Sugar - na Checks Blood Sugar _na____ times a day  Blood Thinner Instructions:na Aspirin Instructions:na  ERAS Protcol -clear liquids until 0500 PRE-SURGERY Ensure or G2- no  COVID TEST- na   Anesthesia review: no  Patient denies shortness of breath, fever, cough and chest pain over the phone call  Oral Hygiene is also important to reduce your risk of infection.  Remember - BRUSH YOUR TEETH THE MORNING OF SURGERY WITH YOUR REGULAR TOOTHPASTE

## 2024-09-08 NOTE — H&P (Signed)
 SENORA Holden is an 44 y.o. female.   Chief Complaint: Chronic right knee wound HPI: Kayla Holden is a 44 year old female with a chronic non-healing ulcer of the right anterior knee.   She sustained a fall on June 19th of the previous year, resulting in a wound over the anterior aspect of the right knee, specifically over the patellar tendon. The majority of the wound has healed; however, a persistent ulcer with a small draining sinus remains. The ulcer has been present for approximately seven months and has not resolved despite ongoing care.   Past Medical History:  Diagnosis Date   Anemia    Anxiety    Anxiety    Arthritis    Bipolar 1 disorder (HCC)    CAD (coronary artery disease)    Chiari malformation    Colitis    Colon polyps    Complication of anesthesia    CYP2D6 extensive metabolizer    Depression    Endometriosis    Fall 02/04/2024   wound vac, skin ulcer of L knee with fat layer exposed. wound dehiscence.   Fatty liver    GERD (gastroesophageal reflux disease)    Hyperlipidemia    Hypertension    IBS (irritable bowel syndrome)    MDD (major depressive disorder)    Microscopic colitis    Migraine headache    Panic disorder with agoraphobia    PONV (postoperative nausea and vomiting)    Vitamin B 12 deficiency     Past Surgical History:  Procedure Laterality Date   APPENDECTOMY     BACK SURGERY  08/08/2023   CHOLECYSTECTOMY     INCISION AND DRAINAGE OF WOUND Left 10/28/2021   Procedure: KNEE ARTHROSCOPY WITH IRRIGATION AND DEBRIDEMENT;  Surgeon: Gerome Charleston, MD;  Location: [facility];  Service: Orthopedics;  Laterality: Left;  60 okay per OR   KNEE SURGERY Left    total of 6 times   MANDIBLE SURGERY Right    x2   right foot arthrocentesis      Family History  Problem Relation Age of Onset   Macular degeneration Mother    Hypertension Father    Cancer Maternal Grandmother    Cancer Paternal Grandfather    Heart attack Paternal Grandfather     Allergic rhinitis Neg Hx    Angioedema Neg Hx    Asthma Neg Hx    Atopy Neg Hx    Eczema Neg Hx    Urticaria Neg Hx    Immunodeficiency Neg Hx    Migraines Neg Hx    Social History:  reports that she has quit smoking. Her smoking use included cigarettes. She has never used smokeless tobacco. She reports current alcohol use. She reports that she does not currently use drugs after having used the following drugs: Marijuana.  Allergies: Allergies[1]  No medications prior to admission.    No results found for this or any previous visit (from the past 48 hours). No results found.  Review of Systems  All other systems reviewed and are negative.   Last menstrual period 09/01/2024. Physical Exam  Patient is alert, oriented, no adenopathy, well-dressed, normal affect, normal respiratory effort. CARDIOVASCULAR: Palpable dorsalis pedis pulse. MUSCULOSKELETAL: Ulcer over patellar tendon with tunneling.   Patient has pain-free range of motion left knee no effusion no signs of intra-articular pathology.  Ulcer is directly over the mid substance of the patella tendon with a large area of a transverse previous traumatic wound.    Assessment/Plan Chronic  infected anterior knee ulcer Chronic non-healing ulcer with tunneling and drainage, indicating chronic infection likely due to retained infected tissue. Surgical intervention indicated. - Planned surgical excision of non-viable and infected soft tissue from the anterior knee ulcer. - Planned intraoperative wound cultures to identify causative organisms. - Instructed her to keep the wound covered and allow drainage until surgery.  Maurilio Deland Collet, PA-C 09/08/2024, 11:34 AM       [1]  Allergies Allergen Reactions   Codeine Other (See Comments)    Patient has CYP2D6, so NO CODEINE   Fioricet-Codeine [Butalbital-Apap-Caff-Cod] Other (See Comments)    Patient has CYP2D6 and cannot tolerate codeine, palpitations, rapid heart  rate   Isoniazid Shortness Of Breath   Ketorolac  Tromethamine  Other (See Comments)    Abdominal Pain if given PO. It has to be given IV   Rifampin Shortness Of Breath and Hives   Tape Other (See Comments)    Paper tape causes blisters    Clindamycin Diarrhea and Other (See Comments)    Abdominal pain, also   Ketoprofen Other (See Comments)    Abdominal pain and cannot take due to history of colitis   Nsaids Other (See Comments)    History of colitis

## 2024-09-09 ENCOUNTER — Ambulatory Visit (HOSPITAL_COMMUNITY): Admitting: Anesthesiology

## 2024-09-09 ENCOUNTER — Other Ambulatory Visit: Payer: Self-pay

## 2024-09-09 ENCOUNTER — Other Ambulatory Visit (HOSPITAL_COMMUNITY): Payer: Self-pay

## 2024-09-09 ENCOUNTER — Ambulatory Visit (HOSPITAL_COMMUNITY)
Admission: RE | Admit: 2024-09-09 | Discharge: 2024-09-09 | Disposition: A | Discharge status: Home / Self Care | Attending: Orthopedic Surgery | Admitting: Orthopedic Surgery

## 2024-09-09 ENCOUNTER — Encounter (HOSPITAL_COMMUNITY)
Admission: RE | Disposition: A | Discharge status: Home / Self Care | Payer: Self-pay | Source: Home / Self Care | Attending: Orthopedic Surgery

## 2024-09-09 ENCOUNTER — Encounter (HOSPITAL_COMMUNITY): Payer: Self-pay | Admitting: Orthopedic Surgery

## 2024-09-09 DIAGNOSIS — Z6841 Body Mass Index (BMI) 40.0 and over, adult: Secondary | ICD-10-CM | POA: Diagnosis not present

## 2024-09-09 DIAGNOSIS — Z79899 Other long term (current) drug therapy: Secondary | ICD-10-CM | POA: Diagnosis not present

## 2024-09-09 DIAGNOSIS — E66813 Obesity, class 3: Secondary | ICD-10-CM | POA: Diagnosis not present

## 2024-09-09 DIAGNOSIS — Z87891 Personal history of nicotine dependence: Secondary | ICD-10-CM | POA: Diagnosis not present

## 2024-09-09 DIAGNOSIS — K219 Gastro-esophageal reflux disease without esophagitis: Secondary | ICD-10-CM | POA: Diagnosis not present

## 2024-09-09 DIAGNOSIS — F319 Bipolar disorder, unspecified: Secondary | ICD-10-CM | POA: Insufficient documentation

## 2024-09-09 DIAGNOSIS — L02416 Cutaneous abscess of left lower limb: Secondary | ICD-10-CM | POA: Diagnosis present

## 2024-09-09 DIAGNOSIS — M71062 Abscess of bursa, left knee: Secondary | ICD-10-CM | POA: Diagnosis not present

## 2024-09-09 DIAGNOSIS — F419 Anxiety disorder, unspecified: Secondary | ICD-10-CM | POA: Diagnosis not present

## 2024-09-09 DIAGNOSIS — I10 Essential (primary) hypertension: Secondary | ICD-10-CM | POA: Diagnosis not present

## 2024-09-09 DIAGNOSIS — F418 Other specified anxiety disorders: Secondary | ICD-10-CM | POA: Diagnosis not present

## 2024-09-09 LAB — POCT PREGNANCY, URINE: Preg Test, Ur: NEGATIVE

## 2024-09-09 LAB — CBC
HCT: 29.3 % — ABNORMAL LOW (ref 36.0–46.0)
Hemoglobin: 10 g/dL — ABNORMAL LOW (ref 12.0–15.0)
MCH: 24.8 pg — ABNORMAL LOW (ref 26.0–34.0)
MCHC: 34.1 g/dL (ref 30.0–36.0)
MCV: 72.5 fL — ABNORMAL LOW (ref 80.0–100.0)
Platelets: 270 K/uL (ref 150–400)
RBC: 4.04 MIL/uL (ref 3.87–5.11)
RDW: 16.4 % — ABNORMAL HIGH (ref 11.5–15.5)
WBC: 6.1 K/uL (ref 4.0–10.5)
nRBC: 0 % (ref 0.0–0.2)

## 2024-09-09 LAB — BASIC METABOLIC PANEL WITH GFR
Anion gap: 12 (ref 5–15)
BUN: 10 mg/dL (ref 6–20)
CO2: 21 mmol/L — ABNORMAL LOW (ref 22–32)
Calcium: 9.5 mg/dL (ref 8.9–10.3)
Chloride: 96 mmol/L — ABNORMAL LOW (ref 98–111)
Creatinine, Ser: 0.85 mg/dL (ref 0.44–1.00)
GFR, Estimated: >60 mL/min
Glucose, Bld: 104 mg/dL — ABNORMAL HIGH (ref 70–99)
Potassium: 4.2 mmol/L (ref 3.5–5.1)
Sodium: 128 mmol/L — ABNORMAL LOW (ref 135–145)

## 2024-09-09 MED ORDER — VANCOMYCIN HCL 500 MG IV SOLR
INTRAVENOUS | Status: AC
  Filled 2024-09-09: qty 10

## 2024-09-09 MED ORDER — FENTANYL CITRATE (PF) 100 MCG/2ML IJ SOLN
INTRAMUSCULAR | Status: AC
  Filled 2024-09-09: qty 2

## 2024-09-09 MED ORDER — MIDAZOLAM HCL (PF) 2 MG/2ML IJ SOLN
INTRAMUSCULAR | Status: DC | PRN
  Administered 2024-09-09: 2 mg via INTRAVENOUS

## 2024-09-09 MED ORDER — ONDANSETRON HCL 4 MG/2ML IJ SOLN
INTRAMUSCULAR | Status: DC | PRN
  Administered 2024-09-09: 4 mg via INTRAVENOUS

## 2024-09-09 MED ORDER — 0.9 % SODIUM CHLORIDE (POUR BTL) OPTIME
TOPICAL | Status: DC | PRN
  Administered 2024-09-09: 1000 mL

## 2024-09-09 MED ORDER — ALBUTEROL SULFATE (2.5 MG/3ML) 0.083% IN NEBU
INHALATION_SOLUTION | RESPIRATORY_TRACT | Status: AC
  Filled 2024-09-09: qty 6

## 2024-09-09 MED ORDER — CHLORHEXIDINE GLUCONATE 0.12 % MT SOLN
15.0000 mL | Freq: Once | OROMUCOSAL | Status: AC
  Administered 2024-09-09: 15 mL via OROMUCOSAL
  Filled 2024-09-09: qty 15

## 2024-09-09 MED ORDER — PROPOFOL 10 MG/ML IV BOLUS
INTRAVENOUS | Status: AC
  Filled 2024-09-09: qty 20

## 2024-09-09 MED ORDER — VANCOMYCIN HCL 500 MG IV SOLR
INTRAVENOUS | Status: DC | PRN
  Administered 2024-09-09: 500 mg via TOPICAL

## 2024-09-09 MED ORDER — PROPOFOL 500 MG/50ML IV EMUL
INTRAVENOUS | Status: DC | PRN
  Administered 2024-09-09: 130 ug/kg/min via INTRAVENOUS

## 2024-09-09 MED ORDER — VASHE WOUND IRRIGATION OPTIME
TOPICAL | Status: DC | PRN
  Administered 2024-09-09: 34 [oz_av]

## 2024-09-09 MED ORDER — OXYCODONE HCL 5 MG/5ML PO SOLN: 5.0000 mg | Freq: Once | ORAL | Status: DC | PRN

## 2024-09-09 MED ORDER — LIDOCAINE HCL 1 % IJ SOLN
INTRAMUSCULAR | Status: DC | PRN
  Administered 2024-09-09: 20 mL

## 2024-09-09 MED ORDER — KETAMINE HCL 50 MG/5ML IJ SOSY
PREFILLED_SYRINGE | INTRAMUSCULAR | Status: AC
  Filled 2024-09-09: qty 5

## 2024-09-09 MED ORDER — LIDOCAINE 2% (20 MG/ML) 5 ML SYRINGE
INTRAMUSCULAR | Status: AC
  Filled 2024-09-09: qty 5

## 2024-09-09 MED ORDER — ACETAMINOPHEN 10 MG/ML IV SOLN
INTRAVENOUS | Status: AC
  Filled 2024-09-09: qty 100

## 2024-09-09 MED ORDER — ORAL CARE MOUTH RINSE: 15.0000 mL | Freq: Once | OROMUCOSAL | Status: AC

## 2024-09-09 MED ORDER — FENTANYL CITRATE (PF) 250 MCG/5ML IJ SOLN
INTRAMUSCULAR | Status: DC | PRN
  Administered 2024-09-09 (×2): 50 ug via INTRAVENOUS

## 2024-09-09 MED ORDER — DEXAMETHASONE SOD PHOSPHATE PF 10 MG/ML IJ SOLN
INTRAMUSCULAR | Status: DC | PRN
  Administered 2024-09-09: 10 mg via INTRAVENOUS

## 2024-09-09 MED ORDER — KETAMINE HCL 50 MG/5ML IJ SOSY
PREFILLED_SYRINGE | INTRAMUSCULAR | Status: DC | PRN
  Administered 2024-09-09: 20 mg via INTRAVENOUS

## 2024-09-09 MED ORDER — PROPOFOL 10 MG/ML IV BOLUS
INTRAVENOUS | Status: DC | PRN
  Administered 2024-09-09: 200 mg via INTRAVENOUS

## 2024-09-09 MED ORDER — MIDAZOLAM HCL 2 MG/2ML IJ SOLN
INTRAMUSCULAR | Status: AC
  Filled 2024-09-09: qty 2

## 2024-09-09 MED ORDER — LACTATED RINGERS IV SOLN: INTRAVENOUS | Status: DC

## 2024-09-09 MED ORDER — ALBUTEROL SULFATE (2.5 MG/3ML) 0.083% IN NEBU
5.0000 mg | INHALATION_SOLUTION | Freq: Once | RESPIRATORY_TRACT | Status: AC
  Administered 2024-09-09: 5 mg via RESPIRATORY_TRACT

## 2024-09-09 MED ORDER — LIDOCAINE HCL (PF) 1 % IJ SOLN
INTRAMUSCULAR | Status: AC
  Filled 2024-09-09: qty 30

## 2024-09-09 MED ORDER — CEFAZOLIN SODIUM-DEXTROSE 2-4 GM/100ML-% IV SOLN
2.0000 g | INTRAVENOUS | Status: AC
  Administered 2024-09-09: 2 g via INTRAVENOUS
  Filled 2024-09-09: qty 100

## 2024-09-09 MED ORDER — OXYCODONE HCL 5 MG PO TABS
5.0000 mg | ORAL_TABLET | ORAL | 0 refills | Status: AC | PRN
  Filled 2024-09-09: qty 30, 5d supply, fill #0

## 2024-09-09 MED ORDER — LIDOCAINE 2% (20 MG/ML) 5 ML SYRINGE
INTRAMUSCULAR | Status: DC | PRN
  Administered 2024-09-09: 80 mg via INTRAVENOUS

## 2024-09-09 MED ORDER — OXYCODONE HCL 5 MG PO TABS: 5.0000 mg | ORAL_TABLET | Freq: Once | ORAL | Status: DC | PRN

## 2024-09-09 MED ORDER — FENTANYL CITRATE (PF) 100 MCG/2ML IJ SOLN: 25.0000 ug | INTRAMUSCULAR | Status: DC | PRN

## 2024-09-09 NOTE — Interval H&P Note (Signed)
 History and Physical Interval Note:  09/09/2024 6:45 AM  Kayla Holden  has presented today for surgery, with the diagnosis of Abscess Left Patella Tendon.  The various methods of treatment have been discussed with the patient and family. After consideration of risks, benefits and other options for treatment, the patient has consented to  Procedures with comments: IRRIGATION AND DEBRIDEMENT KNEE (Left) - LEFT KNEE DEBRIDEMENT as a surgical intervention.  The patient's history has been reviewed, patient examined, no change in status, stable for surgery.  I have reviewed the patient's chart and labs.  Questions were answered to the patient's satisfaction.     Kayla Holden

## 2024-09-09 NOTE — Op Note (Signed)
 09/09/2024  8:03 AM  PATIENT:  Kayla Holden    PRE-OPERATIVE DIAGNOSIS:  Abscess Left Patella Tendon and the bursa  POST-OPERATIVE DIAGNOSIS:  Same  PROCEDURE: Excisional debridement left knee with excision of bursa and peritenon.  Application of vancomycin  powder 500 mg.  Local tissue transfer for wound closure 10 x 4 cm.  Tissue sent for cultures.  SURGEON:  Jerona LULLA Sage, MD  PHYSICIAN ASSISTANT:None ANESTHESIA:   General  PREOPERATIVE INDICATIONS:  Kayla Holden is a  44 y.o. female with a diagnosis of Abscess Left Patella Tendon who failed conservative measures and elected for surgical management.    The risks benefits and alternatives were discussed with the patient preoperatively including but not limited to the risks of infection, bleeding, nerve injury, cardiopulmonary complications, the need for revision surgery, among others, and the patient was willing to proceed.  OPERATIVE IMPLANTS:   * No implants in log *  @ENCIMAGES @  OPERATIVE FINDINGS: No purulent abscess there was cysts few small areas of focal soft tissue necrosis that was sent for cultures  OPERATIVE PROCEDURE: Patient brought the operating room and underwent a general anesthetic.  After adequate levels anesthesia obtained patient's left lower extremity was prepped using DuraPrep draped into a sterile field a timeout was called.  Elliptical incision was made around the ulcerative tissue which left a wound that was 10 x 4 cm.  Tissue margins were undermined to allow for local tissue transfer.  There is some deep areas of nonviable soft tissue this was sent for cultures there was no purulent abscess.  The skin and soft tissue and peritenon was excised in 1 block of tissue.  The wound was irrigated with Vashe.  Tissue was sent for cultures.  The wound was filled with 1 g vancomycin  powder.  Local tissue transfer was used to close the wound 10 x 4 cm with 2-0 nylon.  A sterile dressing was applied patient  was extubated taken the PACU in stable condition   DISCHARGE PLANNING:  Antibiotic duration: Preoperative antibiotics  Weightbearing: Weightbearing as tolerated  Pain medication: Prescription for Percocet  Dressing care/ Wound VAC: Dry dressing  Ambulatory devices: Crutches  Discharge to: Home.  Follow-up: In the office 1 week post operative.

## 2024-09-09 NOTE — Anesthesia Preprocedure Evaluation (Signed)
 "                                  Anesthesia Evaluation  Patient identified by MRN, date of birth, ID band Patient awake    Reviewed: Allergy & Precautions, NPO status , Patient's Chart, lab work & pertinent test results  History of Anesthesia Complications (+) PONV and history of anesthetic complications  Airway Mallampati: III  TM Distance: >3 FB Neck ROM: Full    Dental  (+) Teeth Intact, Dental Advisory Given   Pulmonary neg shortness of breath, neg sleep apnea, neg COPD, neg recent URI, former smoker   breath sounds clear to auscultation       Cardiovascular hypertension, (-) angina (-) Past MI and (-) CHF  Rhythm:Regular     Neuro/Psych  Headaches, neg Seizures PSYCHIATRIC DISORDERS Anxiety Depression Bipolar Disorder      GI/Hepatic Neg liver ROS,GERD  Medicated and Controlled,,  Endo/Other    Class 3 obesity  Renal/GU negative Renal ROSLab Results      Component                Value               Date                      NA                       131 (L)             06/23/2024                K                        4.1                 06/23/2024                CO2                      22                  06/23/2024                GLUCOSE                  111 (H)             06/23/2024                BUN                      5 (L)               06/23/2024                CREATININE               0.91                06/23/2024                CALCIUM                   9.6                 06/23/2024  GFR                      69.84               01/13/2024                GFRNONAA                 >60                 06/23/2024                Musculoskeletal  (+) Arthritis ,    Abdominal   Peds  Hematology  (+) Blood dyscrasia, anemia Lab Results      Component                Value               Date                      WBC                      6.1                 09/09/2024                HGB                      10.0 (L)             09/09/2024                HCT                      29.3 (L)            09/09/2024                MCV                      72.5 (L)            09/09/2024                PLT                      270                 09/09/2024              Anesthesia Other Findings   Reproductive/Obstetrics                              Anesthesia Physical Anesthesia Plan  ASA: 3  Anesthesia Plan: General   Post-op Pain Management: Ketamine  IV* and Ofirmev  IV (intra-op)*   Induction: Intravenous  PONV Risk Score and Plan: 4 or greater and Ondansetron , Dexamethasone , Propofol  infusion and TIVA  Airway Management Planned: LMA  Additional Equipment: None  Intra-op Plan:   Post-operative Plan: Extubation in OR  Informed Consent: I have reviewed the patients History and Physical, chart, labs and discussed the procedure including the risks, benefits and alternatives for the proposed anesthesia with the patient or authorized representative who has indicated his/her understanding and acceptance.     Dental advisory given  Plan Discussed with: CRNA  Anesthesia Plan Comments:  Anesthesia Quick Evaluation  "

## 2024-09-09 NOTE — Anesthesia Postprocedure Evaluation (Signed)
"   Anesthesia Post Note  Patient: Kayla Holden  Procedure(s) Performed: IRRIGATION AND DEBRIDEMENT KNEE (Left: Knee)     Patient location during evaluation: PACU Anesthesia Type: General Level of consciousness: awake and alert Pain management: pain level controlled Vital Signs Assessment: post-procedure vital signs reviewed and stable Respiratory status: spontaneous breathing, nonlabored ventilation and respiratory function stable Cardiovascular status: blood pressure returned to baseline and stable Postop Assessment: no apparent nausea or vomiting Anesthetic complications: no   No notable events documented.                Breean Nannini      "

## 2024-09-09 NOTE — Transfer of Care (Signed)
 Immediate Anesthesia Transfer of Care Note  Patient: Kayla Holden  Procedure(s) Performed: IRRIGATION AND DEBRIDEMENT KNEE (Left: Knee)  Patient Location: PACU  Anesthesia Type:General  Level of Consciousness: awake, alert , and oriented  Airway & Oxygen Therapy: Patient Spontanous Breathing and Patient connected to face mask oxygen  Post-op Assessment: Report given to RN and Post -op Vital signs reviewed and stable  Post vital signs: Reviewed and stable  Last Vitals:  Vitals Value Taken Time  BP 122/93 09/09/24 08:08  Temp 36.5 C 09/09/24 08:08  Pulse 76 09/09/24 08:12  Resp 7 09/09/24 08:12  SpO2 93 % 09/09/24 08:12  Vitals shown include unfiled device data.  Last Pain:  Vitals:   09/09/24 0636  TempSrc:   PainSc: 0-No pain         Complications: No notable events documented.

## 2024-09-09 NOTE — Discharge Instructions (Addendum)
 Weight bearing as tolerates, crutches for balance.  Dry dressing keep clean and dry.  Patient may continue with back physical therapy, per MD.

## 2024-09-09 NOTE — Anesthesia Procedure Notes (Signed)
 Procedure Name: LMA Insertion Date/Time: 09/09/2024 7:36 AM  Performed by: Nami Strawder J, CRNAPre-anesthesia Checklist: Patient identified, Emergency Drugs available, Suction available and Patient being monitored Patient Re-evaluated:Patient Re-evaluated prior to induction Oxygen Delivery Method: Circle System Utilized Preoxygenation: Pre-oxygenation with 100% oxygen Induction Type: IV induction Ventilation: Mask ventilation without difficulty LMA: LMA inserted LMA Size: 4.0 Number of attempts: 1 Airway Equipment and Method: Bite block Placement Confirmation: positive ETCO2 Tube secured with: Tape Dental Injury: Teeth and Oropharynx as per pre-operative assessment

## 2024-09-10 ENCOUNTER — Encounter (HOSPITAL_COMMUNITY): Payer: Self-pay | Admitting: Orthopedic Surgery

## 2024-09-14 LAB — AEROBIC/ANAEROBIC CULTURE W GRAM STAIN (SURGICAL/DEEP WOUND): Culture: NO GROWTH

## 2024-09-16 ENCOUNTER — Ambulatory Visit: Admitting: Physician Assistant

## 2024-09-16 DIAGNOSIS — L02416 Cutaneous abscess of left lower limb: Secondary | ICD-10-CM

## 2024-09-16 MED ORDER — DOXYCYCLINE HYCLATE 100 MG PO TABS: 100.0000 mg | ORAL_TABLET | Freq: Two times a day (BID) | ORAL | 0 refills | Status: AC

## 2024-09-19 ENCOUNTER — Encounter: Payer: Self-pay | Admitting: Physician Assistant

## 2024-09-30 ENCOUNTER — Encounter: Admitting: Physician Assistant

## 2024-10-24 ENCOUNTER — Ambulatory Visit: Admitting: Gastroenterology

## 2024-11-28 ENCOUNTER — Ambulatory Visit: Admitting: Family Medicine
